# Patient Record
Sex: Female | Born: 1965 | Race: Black or African American | Hispanic: No | Smoking: Never smoker
Health system: Southern US, Community
[De-identification: ages and names within clinical notes are randomized; demographics above are authoritative.]

## PROBLEM LIST (undated history)

## (undated) DIAGNOSIS — D649 Anemia, unspecified: Secondary | ICD-10-CM

## (undated) DIAGNOSIS — F99 Mental disorder, not otherwise specified: Secondary | ICD-10-CM

## (undated) DIAGNOSIS — I10 Essential (primary) hypertension: Secondary | ICD-10-CM

## (undated) DIAGNOSIS — R000 Tachycardia, unspecified: Secondary | ICD-10-CM

## (undated) DIAGNOSIS — F419 Anxiety disorder, unspecified: Secondary | ICD-10-CM

## (undated) DIAGNOSIS — IMO0002 Reserved for concepts with insufficient information to code with codable children: Secondary | ICD-10-CM

## (undated) DIAGNOSIS — R87619 Unspecified abnormal cytological findings in specimens from cervix uteri: Secondary | ICD-10-CM

## (undated) DIAGNOSIS — B9689 Other specified bacterial agents as the cause of diseases classified elsewhere: Secondary | ICD-10-CM

## (undated) DIAGNOSIS — D219 Benign neoplasm of connective and other soft tissue, unspecified: Secondary | ICD-10-CM

## (undated) DIAGNOSIS — N760 Acute vaginitis: Principal | ICD-10-CM

## (undated) DIAGNOSIS — I499 Cardiac arrhythmia, unspecified: Secondary | ICD-10-CM

## (undated) HISTORY — DX: Reserved for concepts with insufficient information to code with codable children: IMO0002

## (undated) HISTORY — PX: DILATION AND CURETTAGE OF UTERUS: SHX78

## (undated) HISTORY — PX: TUBAL LIGATION: SHX77

## (undated) HISTORY — PX: ABDOMINAL HYSTERECTOMY: SHX81

## (undated) HISTORY — DX: Other specified bacterial agents as the cause of diseases classified elsewhere: B96.89

## (undated) HISTORY — DX: Acute vaginitis: N76.0

## (undated) HISTORY — DX: Mental disorder, not otherwise specified: F99

## (undated) HISTORY — DX: Unspecified abnormal cytological findings in specimens from cervix uteri: R87.619

---

## 2002-11-11 ENCOUNTER — Emergency Department (HOSPITAL_COMMUNITY): Admission: EM | Admit: 2002-11-11 | Discharge: 2002-11-11 | Payer: Self-pay

## 2003-01-22 ENCOUNTER — Encounter: Payer: Self-pay | Admitting: Emergency Medicine

## 2003-01-22 ENCOUNTER — Emergency Department (HOSPITAL_COMMUNITY): Admission: EM | Admit: 2003-01-22 | Discharge: 2003-01-22 | Payer: Self-pay | Admitting: *Deleted

## 2004-08-01 ENCOUNTER — Other Ambulatory Visit: Admission: RE | Admit: 2004-08-01 | Discharge: 2004-08-01 | Payer: Self-pay | Admitting: Obstetrics and Gynecology

## 2006-03-08 ENCOUNTER — Emergency Department (HOSPITAL_COMMUNITY): Admission: EM | Admit: 2006-03-08 | Discharge: 2006-03-08 | Payer: Self-pay | Admitting: Emergency Medicine

## 2007-09-07 ENCOUNTER — Emergency Department (HOSPITAL_COMMUNITY): Admission: EM | Admit: 2007-09-07 | Discharge: 2007-09-07 | Payer: Self-pay | Admitting: Emergency Medicine

## 2008-10-30 ENCOUNTER — Emergency Department (HOSPITAL_COMMUNITY): Admission: EM | Admit: 2008-10-30 | Discharge: 2008-10-30 | Payer: Self-pay | Admitting: Emergency Medicine

## 2009-09-07 ENCOUNTER — Emergency Department (HOSPITAL_COMMUNITY): Admission: EM | Admit: 2009-09-07 | Discharge: 2009-09-07 | Payer: Self-pay | Admitting: Emergency Medicine

## 2010-03-31 ENCOUNTER — Emergency Department (HOSPITAL_COMMUNITY): Admission: EM | Admit: 2010-03-31 | Discharge: 2010-03-31 | Payer: Self-pay | Admitting: Emergency Medicine

## 2010-08-18 ENCOUNTER — Emergency Department (HOSPITAL_COMMUNITY): Admission: EM | Admit: 2010-08-18 | Discharge: 2010-08-18 | Payer: Self-pay | Admitting: Emergency Medicine

## 2010-09-13 ENCOUNTER — Emergency Department (HOSPITAL_COMMUNITY): Admission: EM | Admit: 2010-09-13 | Discharge: 2010-09-13 | Payer: Self-pay | Admitting: Emergency Medicine

## 2011-03-15 LAB — URINALYSIS, ROUTINE W REFLEX MICROSCOPIC
Bilirubin Urine: NEGATIVE
Bilirubin Urine: NEGATIVE
Glucose, UA: NEGATIVE mg/dL
Glucose, UA: NEGATIVE mg/dL
Hgb urine dipstick: NEGATIVE
Ketones, ur: NEGATIVE mg/dL
Ketones, ur: NEGATIVE mg/dL
Leukocytes, UA: NEGATIVE
Nitrite: NEGATIVE
Nitrite: NEGATIVE
Protein, ur: NEGATIVE mg/dL
Protein, ur: NEGATIVE mg/dL
Specific Gravity, Urine: 1.005 (ref 1.005–1.030)
Specific Gravity, Urine: <1.005 — ABNORMAL LOW (ref 1.005–1.030)
Urobilinogen, UA: 0.2 mg/dL (ref 0.0–1.0)
Urobilinogen, UA: 0.2 mg/dL (ref 0.0–1.0)
pH: 5.5 (ref 5.0–8.0)
pH: 6 (ref 5.0–8.0)

## 2011-03-15 LAB — BASIC METABOLIC PANEL
BUN: 4 mg/dL — ABNORMAL LOW (ref 6–23)
CO2: 27 mEq/L (ref 19–32)
Calcium: 9.1 mg/dL (ref 8.4–10.5)
Chloride: 105 mEq/L (ref 96–112)
Creatinine, Ser: 0.79 mg/dL (ref 0.4–1.2)
GFR calc Af Amer: >60 mL/min (ref >60)
GFR calc non Af Amer: >60 mL/min (ref >60)
Glucose, Bld: 77 mg/dL (ref 70–99)
Potassium: 3.5 mEq/L (ref 3.5–5.1)
Sodium: 138 mEq/L (ref 135–145)

## 2011-03-15 LAB — CBC
HCT: 28.3 % — ABNORMAL LOW (ref 36.0–46.0)
Hemoglobin: 8.7 g/dL — ABNORMAL LOW (ref 12.0–15.0)
MCHC: 30.7 g/dL (ref 30.0–36.0)
MCV: 59.3 fL — ABNORMAL LOW (ref 78.0–100.0)
Platelets: 422 10*3/uL — ABNORMAL HIGH (ref 150–400)
RBC: 4.78 MIL/uL (ref 3.87–5.11)
RDW: 20.5 % — ABNORMAL HIGH (ref 11.5–15.5)
WBC: 3.6 10*3/uL — ABNORMAL LOW (ref 4.0–10.5)

## 2011-03-15 LAB — DIFFERENTIAL
Basophils Absolute: 0 10*3/uL (ref 0.0–0.1)
Basophils Relative: 0 % (ref 0–1)
Eosinophils Absolute: 0.1 10*3/uL (ref 0.0–0.7)
Eosinophils Relative: 2 % (ref 0–5)
Lymphocytes Relative: 30 % (ref 12–46)
Lymphs Abs: 1.1 10*3/uL (ref 0.7–4.0)
Monocytes Absolute: 0.3 10*3/uL (ref 0.1–1.0)
Monocytes Relative: 9 % (ref 3–12)
Neutro Abs: 2.1 10*3/uL (ref 1.7–7.7)
Neutrophils Relative %: 60 % (ref 43–77)

## 2011-03-15 LAB — PREGNANCY, URINE: Preg Test, Ur: NEGATIVE

## 2011-03-15 LAB — URINE MICROSCOPIC-ADD ON

## 2011-03-15 LAB — GLUCOSE, CAPILLARY: Glucose-Capillary: 80 mg/dL (ref 70–99)

## 2011-03-31 LAB — WET PREP, GENITAL
Trich, Wet Prep: NONE SEEN
Yeast Wet Prep HPF POC: NONE SEEN

## 2011-03-31 LAB — URINE MICROSCOPIC-ADD ON

## 2011-03-31 LAB — URINALYSIS, ROUTINE W REFLEX MICROSCOPIC
Bilirubin Urine: NEGATIVE
Glucose, UA: NEGATIVE mg/dL
Hgb urine dipstick: NEGATIVE
Ketones, ur: NEGATIVE mg/dL
Leukocytes, UA: NEGATIVE
Nitrite: POSITIVE — AB
Protein, ur: NEGATIVE mg/dL
Specific Gravity, Urine: 1.015 (ref 1.005–1.030)
Urobilinogen, UA: 0.2 mg/dL (ref 0.0–1.0)
pH: 5.5 (ref 5.0–8.0)

## 2011-03-31 LAB — GC/CHLAMYDIA PROBE AMP, GENITAL
Chlamydia, DNA Probe: NEGATIVE
GC Probe Amp, Genital: NEGATIVE

## 2011-03-31 LAB — URINE CULTURE: Colony Count: >=100000

## 2011-05-09 NOTE — Consult Note (Signed)
Vicki [name], Vicki [name]               ACCOUNT NO.:  [id number]   MEDICAL RECORD NO.:  [id number]          PATIENT TYPE:  INP   LOCATION:  A339                          FACILITY:  [facility]   PHYSICIAN:  Tilda Burrow, M.D. DATE OF BIRTH:  02/01/1966   DATE OF CONSULTATION:  DATE OF DISCHARGE:  09/07/2007                                 CONSULTATION   CHIEF COMPLAINT:  Heavy vaginal bleeding, iron deficiency anemia.   HISTORY OF PRESENT ILLNESS:  This 45 year old female patient of [facility] is seen in the emergency room due to cramping, heavy flow,  on day #2 of her menses.  She is seen in the emergency room where white  count is normal at 5900, hemoglobin severely anemic at 7.0, hematocrit  23.9%, with hypochromic microcytic indices with MCV 56.4.  Urinalysis  negative.  BUN 5, creatinine 0.78.  She describes herself as having only  3-day periods but really severe heavy flow when she has them.  She  presents here for evaluation due to the heavy bleeding.  Her platelets  are 344,000.  She has not been seen in our office for a year, but she  reports being told in the past that she had fibroids.   PHYSICAL EXAMINATION:  GENERAL:  Very slim-figured African American  female, alert and oriented x3, able to sit up in bed with mild-to-  moderate discomfort related to her menstrual cramping only.  She is  tolerating regular food.  IV is in place.  SKIN:  Skin turgor is good.  ABDOMEN:  Nontender, except for small suprapubic tenderness.  Urinalysis  negative.  PELVIC:  Normal external genitalia.  Speculum exam shows heavy pooling  of probably 30 mL of blood in the vaginal vault, with cervix grossly  normal to appearance.  The uterus is anteflexed, upper limits of normal  size at most.  Easily examined adnexa without masses.  There are no  active clots at the present time.   ASSESSMENT:  Menorrhagia and severe anemia.   PLAN:  1. Prescription for Repliva, 1 tablet daily x30 days.  2. Ovcon-50 1 tablets 3 times a day for 7 days to control menses.  3. Vicodin 5/500, 30 tablets, 1-2 q.4h. p.r.n. pain.   FOLLOW UP:  In 1 week at [facility] for evaluation.  Endometrial  ablation is discussed with the patient as a future possible solution if  ultrasound confirms grossly normal endometrial cavity.      Tilda Burrow, M.D.  Electronically Signed    JVF/MEDQ  D:  09/07/2007  T:  09/08/2007  Job:  [id number]

## 2011-10-06 LAB — CROSSMATCH
ABO/RH(D): O POS
Antibody Screen: NEGATIVE

## 2011-10-06 LAB — URINALYSIS, ROUTINE W REFLEX MICROSCOPIC
Bilirubin Urine: NEGATIVE
Glucose, UA: NEGATIVE
Hgb urine dipstick: NEGATIVE
Ketones, ur: NEGATIVE
Nitrite: NEGATIVE
Protein, ur: NEGATIVE
Specific Gravity, Urine: 1.015
Urobilinogen, UA: 0.2
pH: 7

## 2011-10-06 LAB — DIFFERENTIAL
Band Neutrophils: 0
Basophils Absolute: 0
Basophils Relative: 0
Blasts: 0
Eosinophils Absolute: 0
Eosinophils Relative: 0
Lymphocytes Relative: 32
Lymphs Abs: 1.9
Metamyelocytes Relative: 0
Monocytes Absolute: 0.3
Monocytes Relative: 5
Myelocytes: 0
Neutro Abs: 3.7
Neutrophils Relative %: 63
Promyelocytes Absolute: 0
nRBC: 0

## 2011-10-06 LAB — BASIC METABOLIC PANEL
BUN: 5 — ABNORMAL LOW
CO2: 27
Calcium: 9.1
Chloride: 110
Creatinine, Ser: 0.78
GFR calc Af Amer: >60
GFR calc non Af Amer: >60
Glucose, Bld: 96
Potassium: 3.6
Sodium: 139

## 2011-10-06 LAB — URINE MICROSCOPIC-ADD ON

## 2011-10-06 LAB — CBC
HCT: 23.9 — ABNORMAL LOW
Hemoglobin: 7 — CL
MCHC: 29.3 — ABNORMAL LOW
MCV: 56.4 — ABNORMAL LOW
Platelets: 344
RBC: 4.24
RDW: 19.7 — ABNORMAL HIGH
WBC: 5.9

## 2011-10-06 LAB — SAMPLE TO BLOOD BANK

## 2011-10-06 LAB — ABO/RH: ABO/RH(D): O POS

## 2012-04-02 ENCOUNTER — Encounter (HOSPITAL_COMMUNITY): Payer: Self-pay | Admitting: *Deleted

## 2012-04-02 ENCOUNTER — Inpatient Hospital Stay (HOSPITAL_COMMUNITY)
Admission: EM | Admit: 2012-04-02 | Discharge: 2012-04-03 | DRG: 690 | Disposition: A | Discharge status: Home / Self Care | Payer: Self-pay | Attending: Internal Medicine | Admitting: Internal Medicine

## 2012-04-02 DIAGNOSIS — D649 Anemia, unspecified: Secondary | ICD-10-CM | POA: Diagnosis present

## 2012-04-02 DIAGNOSIS — E876 Hypokalemia: Secondary | ICD-10-CM | POA: Diagnosis present

## 2012-04-02 DIAGNOSIS — N920 Excessive and frequent menstruation with regular cycle: Secondary | ICD-10-CM | POA: Diagnosis present

## 2012-04-02 DIAGNOSIS — N390 Urinary tract infection, site not specified: Principal | ICD-10-CM | POA: Diagnosis present

## 2012-04-02 DIAGNOSIS — R112 Nausea with vomiting, unspecified: Secondary | ICD-10-CM | POA: Diagnosis present

## 2012-04-02 DIAGNOSIS — D500 Iron deficiency anemia secondary to blood loss (chronic): Secondary | ICD-10-CM | POA: Diagnosis present

## 2012-04-02 DIAGNOSIS — D259 Leiomyoma of uterus, unspecified: Secondary | ICD-10-CM | POA: Diagnosis present

## 2012-04-02 DIAGNOSIS — R1110 Vomiting, unspecified: Secondary | ICD-10-CM | POA: Diagnosis present

## 2012-04-02 HISTORY — DX: Anemia, unspecified: D64.9

## 2012-04-02 HISTORY — DX: Benign neoplasm of connective and other soft tissue, unspecified: D21.9

## 2012-04-02 LAB — URINALYSIS, ROUTINE W REFLEX MICROSCOPIC
Bilirubin Urine: NEGATIVE
Glucose, UA: NEGATIVE mg/dL
Hgb urine dipstick: NEGATIVE
Ketones, ur: NEGATIVE mg/dL
Nitrite: POSITIVE — AB
Protein, ur: NEGATIVE mg/dL
Specific Gravity, Urine: 1.007 (ref 1.005–1.030)
Urobilinogen, UA: 1 mg/dL (ref 0.0–1.0)
pH: 7.5 (ref 5.0–8.0)

## 2012-04-02 LAB — HEPATIC FUNCTION PANEL
ALT: 7 U/L (ref 0–35)
AST: 26 U/L (ref 0–37)
Albumin: 4 g/dL (ref 3.5–5.2)
Alkaline Phosphatase: 66 U/L (ref 39–117)
Bilirubin, Direct: <0.1 mg/dL (ref 0.0–0.3)
Total Bilirubin: 0.2 mg/dL — ABNORMAL LOW (ref 0.3–1.2)
Total Protein: 7.5 g/dL (ref 6.0–8.3)

## 2012-04-02 LAB — BASIC METABOLIC PANEL
BUN: 8 mg/dL (ref 6–23)
CO2: 24 mEq/L (ref 19–32)
Calcium: 8.8 mg/dL (ref 8.4–10.5)
Chloride: 103 mEq/L (ref 96–112)
Creatinine, Ser: 0.77 mg/dL (ref 0.50–1.10)
GFR calc Af Amer: >90 mL/min (ref >90)
GFR calc non Af Amer: >90 mL/min (ref >90)
Glucose, Bld: 82 mg/dL (ref 70–99)
Potassium: 3.4 mEq/L — ABNORMAL LOW (ref 3.5–5.1)
Sodium: 137 mEq/L (ref 135–145)

## 2012-04-02 LAB — DIFFERENTIAL
Basophils Absolute: 0.1 10*3/uL (ref 0.0–0.1)
Basophils Relative: 1 % (ref 0–1)
Eosinophils Absolute: 0.1 10*3/uL (ref 0.0–0.7)
Eosinophils Relative: 2 % (ref 0–5)
Lymphocytes Relative: 41 % (ref 12–46)
Lymphs Abs: 2.4 10*3/uL (ref 0.7–4.0)
Monocytes Absolute: 0.4 10*3/uL (ref 0.1–1.0)
Monocytes Relative: 6 % (ref 3–12)
Neutro Abs: 2.9 10*3/uL (ref 1.7–7.7)
Neutrophils Relative %: 50 % (ref 43–77)

## 2012-04-02 LAB — CBC
HCT: 24.4 % — ABNORMAL LOW (ref 36.0–46.0)
Hemoglobin: 6.6 g/dL — CL (ref 12.0–15.0)
MCH: 15.2 pg — ABNORMAL LOW (ref 26.0–34.0)
MCHC: 27 g/dL — ABNORMAL LOW (ref 30.0–36.0)
MCV: 56.1 fL — ABNORMAL LOW (ref 78.0–100.0)
Platelets: 398 10*3/uL (ref 150–400)
RBC: 4.35 MIL/uL (ref 3.87–5.11)
RDW: 19.2 % — ABNORMAL HIGH (ref 11.5–15.5)
WBC: 5.9 10*3/uL (ref 4.0–10.5)

## 2012-04-02 LAB — URINE MICROSCOPIC-ADD ON

## 2012-04-02 LAB — PREPARE RBC (CROSSMATCH)

## 2012-04-02 LAB — PROTIME-INR
INR: 1.01 (ref 0.00–1.49)
Prothrombin Time: 13.5 seconds (ref 11.6–15.2)

## 2012-04-02 LAB — APTT: aPTT: 31 seconds (ref 24–37)

## 2012-04-02 LAB — OCCULT BLOOD, POC DEVICE: Fecal Occult Bld: NEGATIVE

## 2012-04-02 MED ORDER — DEXTROSE 5 % IV SOLN
1.0000 g | Freq: Once | INTRAVENOUS | Status: AC
  Administered 2012-04-02: 1 g via INTRAVENOUS
  Filled 2012-04-02: qty 10

## 2012-04-02 MED ORDER — SODIUM CHLORIDE 0.9 % IV BOLUS (SEPSIS)
1000.0000 mL | Freq: Once | INTRAVENOUS | Status: AC
  Administered 2012-04-02: 1000 mL via INTRAVENOUS

## 2012-04-02 NOTE — ED Notes (Signed)
Patient states she does not feel good.  She has nausea since Saturday.  She states has dry heaves.

## 2012-04-02 NOTE — ED Provider Notes (Signed)
History     CSN: [id number]  Arrival date & time 04/02/12  1554   First MD Initiated Contact with Patient 04/02/12 2149      Chief Complaint  Patient presents with  . Emesis  . Weakness    (Consider location/radiation/quality/duration/timing/severity/associated sxs/prior treatment) Patient is a 46 y.o. female presenting with abdominal pain. The history is provided by the patient.  Abdominal Pain The primary symptoms of the illness include abdominal pain, fatigue and dysuria. The primary symptoms of the illness do not include fever, shortness of breath, nausea, vomiting, vaginal discharge or vaginal bleeding. Episode onset: 4 days ago. The onset of the illness was sudden. The problem has not changed since onset. The abdominal pain is located in the suprapubic region. The abdominal pain does not radiate.  The dysuria is associated with frequency. The dysuria is not associated with urgency or vaginal pain.   Additional symptoms associated with the illness include frequency. Symptoms associated with the illness do not include chills, constipation or urgency.    History reviewed. No pertinent past medical history.  Past Surgical History  Procedure Date  . Tubal ligation     No family history on file.  History  Substance Use Topics  . Smoking status: Never Smoker   . Smokeless tobacco: Not on file  . Alcohol Use: Yes    OB History    Grav Para Term Preterm Abortions TAB SAB Ect Mult Living                  Review of Systems  Constitutional: Positive for fatigue. Negative for fever and chills.  HENT: Negative for congestion and rhinorrhea.   Respiratory: Negative for shortness of breath.   Cardiovascular: Negative for chest pain and leg swelling.  Gastrointestinal: Positive for abdominal pain. Negative for nausea, vomiting and constipation.  Genitourinary: Positive for dysuria and frequency. Negative for urgency, decreased urine volume, vaginal bleeding, vaginal  discharge, difficulty urinating and vaginal pain.  Skin: Negative for wound.  Neurological: Positive for weakness (generalized) and light-headedness. Negative for headaches.  Psychiatric/Behavioral: Negative for confusion.  All other systems reviewed and are negative.    Allergies  Review of patient's allergies indicates no known allergies.  Home Medications   Current Outpatient Rx  Name Route Sig Dispense Refill  . ALKA-SELTZER PLUS COLD PO Oral Take 2 Wafers by mouth 2 (two) times daily as needed. For cold symptoms      BP 115/71  Pulse 65  Temp(Src) 98.5 F (36.9 C) (Oral)  Resp 12  Ht 5\' 7"  (1.702 m)  Wt 125 lb (56.7 kg)  BMI 19.58 kg/m2  SpO2 100%  Physical Exam  Nursing note and vitals reviewed. Constitutional: She is oriented to person, place, and time. She appears well-developed and well-nourished. No distress.  HENT:  Head: Normocephalic and atraumatic.  Right Ear: External ear normal.  Left Ear: External ear normal.  Nose: Nose normal.  Mouth/Throat: Oropharynx is clear and moist.  Neck: Neck supple.  Cardiovascular: Normal rate, regular rhythm, normal heart sounds and intact distal pulses.   Pulmonary/Chest: Effort normal and breath sounds normal. No respiratory distress. She has no wheezes. She has no rales.  Abdominal: Soft. She exhibits no distension. There is tenderness in the suprapubic area.  Genitourinary: Rectum normal. Guaiac negative stool.  Musculoskeletal: She exhibits no edema.  Lymphadenopathy:    She has no cervical adenopathy.  Neurological: She is alert and oriented to person, place, and time.  Skin: Skin is warm and  dry. She is not diaphoretic. No pallor.    ED Course  Procedures (including critical care time)  Labs Reviewed  BASIC METABOLIC PANEL - Abnormal; Notable for the following:    Potassium 3.4 (*)    All other components within normal limits  URINALYSIS, ROUTINE W REFLEX MICROSCOPIC - Abnormal; Notable for the following:      Nitrite POSITIVE (*)    Leukocytes, UA MODERATE (*)    All other components within normal limits  CBC - Abnormal; Notable for the following:    Hemoglobin 6.6 (*)    HCT 24.4 (*)    MCV 56.1 (*)    MCH 15.2 (*)    MCHC 27.0 (*)    RDW 19.2 (*)    All other components within normal limits  POCT PREGNANCY, URINE - Abnormal; Notable for the following:    All other components within normal limits  URINE MICROSCOPIC-ADD ON - Abnormal; Notable for the following:    Bacteria, UA MANY (*)    All other components within normal limits  DIFFERENTIAL  PREPARE RBC (CROSSMATCH)  TYPE AND SCREEN  APTT  PROTIME-INR  OCCULT BLOOD, POC DEVICE  HEPATIC FUNCTION PANEL  URINE CULTURE    1. Anemia   2. UTI (urinary tract infection)       MDM  46 yo female with weakness, nausea and lower abd pain for a few days. No diarrhea, vomiting or constipation. +urinary symptoms, as well as suprapubic tenderness. No vaginal symptoms currently, but on questioning after seeing Hgb, states she's had heavy periods due to fibroids for years, however has not had a hysterectomy yet. No active bleeding now. No blood in stools, and rectal exam negative. Due to her being symptomatic with anemia, will transfuse and admit to hospitalist. Urine is positive for UTI (has been symptomatic), will culture and give rocephin. Pregnancy test positive in computer, but when speaking with the lab tech who did the test he says it was negative but that he clicked the wrong button.        Pricilla Loveless, MD 04/02/12 2325

## 2012-04-02 NOTE — ED Notes (Signed)
Mini-lab note by Michel Santee.  Accidentally entered a positive result on pregnancy test on glucometer.  The actual result was negative and is indicated on paper documentation.  A POC edit sheet has been completed to correct result in chart and faxed to POC office.

## 2012-04-02 NOTE — ED Notes (Signed)
Pt stayed at 99-100% while ambulating in the hall.

## 2012-04-03 ENCOUNTER — Encounter (HOSPITAL_COMMUNITY): Payer: Self-pay | Admitting: Internal Medicine

## 2012-04-03 ENCOUNTER — Other Ambulatory Visit: Payer: Self-pay

## 2012-04-03 DIAGNOSIS — E876 Hypokalemia: Secondary | ICD-10-CM | POA: Diagnosis present

## 2012-04-03 DIAGNOSIS — R1110 Vomiting, unspecified: Secondary | ICD-10-CM | POA: Diagnosis present

## 2012-04-03 DIAGNOSIS — N920 Excessive and frequent menstruation with regular cycle: Secondary | ICD-10-CM | POA: Diagnosis present

## 2012-04-03 DIAGNOSIS — D649 Anemia, unspecified: Secondary | ICD-10-CM | POA: Diagnosis present

## 2012-04-03 DIAGNOSIS — N390 Urinary tract infection, site not specified: Secondary | ICD-10-CM | POA: Diagnosis present

## 2012-04-03 LAB — RETICULOCYTES
RBC.: 4.2 MIL/uL (ref 3.87–5.11)
Retic Count, Absolute: 33.6 10*3/uL (ref 19.0–186.0)
Retic Ct Pct: 0.8 % (ref 0.4–3.1)

## 2012-04-03 LAB — CARDIAC PANEL(CRET KIN+CKTOT+MB+TROPI)
CK, MB: 1 ng/mL (ref 0.3–4.0)
Relative Index: INVALID (ref 0.0–2.5)
Total CK: 38 U/L (ref 7–177)
Troponin I: <0.3 ng/mL (ref <0.30)

## 2012-04-03 LAB — BASIC METABOLIC PANEL
BUN: 6 mg/dL (ref 6–23)
CO2: 21 mEq/L (ref 19–32)
Calcium: 9 mg/dL (ref 8.4–10.5)
Chloride: 109 mEq/L (ref 96–112)
Creatinine, Ser: 0.81 mg/dL (ref 0.50–1.10)
GFR calc Af Amer: >90 mL/min (ref >90)
GFR calc non Af Amer: 85 mL/min — ABNORMAL LOW (ref >90)
Glucose, Bld: 94 mg/dL (ref 70–99)
Potassium: 3.6 mEq/L (ref 3.5–5.1)
Sodium: 141 mEq/L (ref 135–145)

## 2012-04-03 LAB — FERRITIN: Ferritin: 2 ng/mL — ABNORMAL LOW (ref 10–291)

## 2012-04-03 LAB — CBC
HCT: 32.8 % — ABNORMAL LOW (ref 36.0–46.0)
Hemoglobin: 9.8 g/dL — ABNORMAL LOW (ref 12.0–15.0)
MCH: 19.1 pg — ABNORMAL LOW (ref 26.0–34.0)
MCHC: 29.9 g/dL — ABNORMAL LOW (ref 30.0–36.0)
MCV: 63.9 fL — ABNORMAL LOW (ref 78.0–100.0)
Platelets: 316 10*3/uL (ref 150–400)
RBC: 5.13 MIL/uL — ABNORMAL HIGH (ref 3.87–5.11)
RDW: 27.6 % — ABNORMAL HIGH (ref 11.5–15.5)
WBC: 6 10*3/uL (ref 4.0–10.5)

## 2012-04-03 LAB — VITAMIN B12: Vitamin B-12: 347 pg/mL (ref 211–911)

## 2012-04-03 LAB — ABO/RH: ABO/RH(D): O POS

## 2012-04-03 LAB — IRON AND TIBC
Iron: <10 ug/dL — ABNORMAL LOW (ref 42–135)
UIBC: 393 ug/dL (ref 125–400)

## 2012-04-03 LAB — PREPARE RBC (CROSSMATCH)

## 2012-04-03 LAB — FOLATE: Folate: 8.4 ng/mL

## 2012-04-03 LAB — POCT PREGNANCY, URINE: Preg Test, Ur: NEGATIVE

## 2012-04-03 MED ORDER — CIPROFLOXACIN HCL 500 MG PO TABS: 500.0000 mg | ORAL_TABLET | Freq: Two times a day (BID) | ORAL | Status: AC

## 2012-04-03 MED ORDER — FERROUS SULFATE 325 (65 FE) MG PO TABS: 325.0000 mg | ORAL_TABLET | Freq: Three times a day (TID) | ORAL | Status: DC

## 2012-04-03 MED ORDER — PANTOPRAZOLE SODIUM 40 MG PO TBEC: 40.0000 mg | DELAYED_RELEASE_TABLET | Freq: Every day | ORAL | Status: DC

## 2012-04-03 MED ORDER — DEXTROSE 5 % IV SOLN
1.0000 g | Freq: Every day | INTRAVENOUS | Status: DC
  Filled 2012-04-03 (×2): qty 10

## 2012-04-03 MED ORDER — DEXTROSE 5 % IV SOLN: 1.0000 g | INTRAVENOUS | Status: DC

## 2012-04-03 MED ORDER — SODIUM CHLORIDE 0.9 % IJ SOLN
3.0000 mL | Freq: Two times a day (BID) | INTRAMUSCULAR | Status: DC
  Administered 2012-04-03: 3 mL via INTRAVENOUS

## 2012-04-03 MED ORDER — SODIUM CHLORIDE 0.45 % IV SOLN
INTRAVENOUS | Status: DC
  Administered 2012-04-03: 09:00:00 via INTRAVENOUS

## 2012-04-03 MED ORDER — ACETAMINOPHEN 650 MG RE SUPP: 650.0000 mg | Freq: Four times a day (QID) | RECTAL | Status: DC | PRN

## 2012-04-03 MED ORDER — ACETAMINOPHEN 325 MG PO TABS: 650.0000 mg | ORAL_TABLET | Freq: Four times a day (QID) | ORAL | Status: DC | PRN

## 2012-04-03 NOTE — Progress Notes (Signed)
Patient was under my care since 4-9 until 4-10. She has been sick since 4-08.  Please excuse patient from work prior dates.   Hartley Barefoot MD. [facility].

## 2012-04-03 NOTE — Discharge Summary (Signed)
Admit date: 04/02/2012 Discharge date: 04/03/2012  Primary Care Physician:  Kirk Ruths, MD, MD   Discharge Diagnoses:     Date Noted   . Anemia, iron deficiency.  04/03/2012   . Menorrhagia 04/03/2012   . Hypokalemia, resolved. 04/03/2012   . UTI (lower urinary tract infection) 04/03/2012   . Emesis, resolved.  04/03/2012              DISCHARGE MEDICATION: Medication List  As of 04/03/2012 11:11 AM   TAKE these medications         ALKA-SELTZER PLUS COLD PO   Take 2 Wafers by mouth 2 (two) times daily as needed. For cold symptoms      ciprofloxacin 500 MG tablet   Commonly known as: CIPRO   Take 1 tablet (500 mg total) by mouth 2 (two) times daily.      ferrous sulfate 325 (65 FE) MG tablet   Take 1 tablet (325 mg total) by mouth 3 (three) times daily with meals.            HPI:   AA 46 year old female with no significant past medical history except for known uterine fibroid and heavy periods. Patient is being followed by Dr.Ferguson over at [facility]. She is apparently scheduled to have some surgery in the past which never happened. She is currently on high periods which usually lasts about 3 days a month. Whenever she has a period, she has heavy bleeding she uses up to 2 packs of pads which is a total of 36 individual packs in 3 days. She started experiencing weakness recently. Patient is worried about what may have happened because she had it in the past. She had blood transfusion but nothing recent. She decided to come in for further workup. She denied abdominal pain no vaginal discharge but she did have some emesis. She denied any fever. She denied any other bleeding from rectum all other sites. In the emergency room her hemoglobin was found to be only 6.6. No hemoglobin is currently unavailable to compare but patient the last time she had it checked it was within normal limits after blood transfusion. She has not followed up with her gynecologist in a long  time  Hospital Course:  1-Iron deficiency anemia, symptomatic. Likely secondary to menorrhagia. Patient Hb increase to 9 from 6. She received 2 units of PRBC. No evidence of hemolysis, guaiac stool negative. She is feeling better. Denies dizziness or weakness. Anemia panel pending , result will need to be follow up by PCP. I will discharge her on ferrous sulfate. She will need to follow up with her GYN. Patient denies vaginal bleeding at this time.   2- UTI: Received 1 dose ceftriaxone. Will discharge on ciprofloxacin. Urine culture will need to be follow up by PCP.   3-Vomiting : resolved. Tolerating diet.     Disposition and Follow-up: with PCP for hb and with GYN.  Discharge Orders    Future Orders Please Complete By Expires   Diet general      Increase activity slowly        Follow-up Information    Follow up with Kirk Ruths, MD in 3 days. (Please follow up with Gynecologist.)           DISCHARGE EXAM:  General: no distress. CVS: S1 S2 RRR. Lungs: CTA. Abdomen: BS presents, soft NT, ND. Extremities : no edema.   Blood pressure 115/63, pulse 62, temperature 98.8 F (37.1 C), temperature source Oral, resp. rate  16, height 5\' 7"  (1.702 m), weight 52.164 kg (115 lb), last menstrual period 03/20/2012, SpO2 100.00%.   Basename 04/03/12 0830 04/02/12 2156  NA 141 137  K 3.6 3.4*  CL 109 103  CO2 21 24  GLUCOSE 94 82  BUN 6 8  CREATININE 0.81 0.77  CALCIUM 9.0 8.8  MG -- --  PHOS -- --    Basename 04/02/12 2248  AST 26  ALT 7  ALKPHOS 66  BILITOT 0.2*  PROT 7.5  ALBUMIN 4.0   Basename 04/03/12 0830 04/02/12 2156  WBC 6.0 5.9  NEUTROABS -- 2.9  HGB 9.8* 6.6*  HCT 32.8* 24.4*  MCV 63.9* 56.1*  PLT 316 398    Signed: Jaidin Richison M.D. 04/03/2012, 11:11 AM

## 2012-04-03 NOTE — ED Notes (Signed)
Blood was started AFTER anemia panel was drawn!

## 2012-04-03 NOTE — Progress Notes (Signed)
Pt received discharge instructions and new prescriptions. Phone number for OB-GYN given to patient so she can call for appointment today. Stable to be DC'd. No ride, but patient feels she can drive. Discuss with Dr. Sunnie Nielsen and she said this is okay since she is only driving a short distance. No further questions. Duwaine Maxin, RN

## 2012-04-03 NOTE — ED Provider Notes (Signed)
  I performed a history and physical examination of Vicki [name] and discussed her management with Dr. Criss Alvine.  I agree with the history, physical, assessment, and plan of care, with the following exceptions: None  Symptomatic anemia. She's had increasing abdominal pain, generalized weakness over the past 4 days. She also has urinary symptoms. Mild suprapubic abdominal pain. Was found have a urinary tract infection and hemoglobin of 6.6. She'll require transfusion and admission to the hospital for symptomatic anemia and urinary tract infection. Just received Rocephin.  I was present for the following procedures: None Time Spent in Critical Care of the patient: None Time spent in discussions with the patient and family: 10 min  Tildon Husky, MD 04/03/12 [phone]

## 2012-04-03 NOTE — H&P (Signed)
Vicki [name] is an 46 y.o. female.   Chief Complaint: Weakness HPI: A 46 year old female with no significant past medical history except for known uterine fibroid and heavy periods. Patient is being followed by Dr.Ferguson over at [facility] She is apparently scheduled to have some surgery in the past which never happened. She is currently on high periods which usually lasts about 3 days a month. Whenever she has a period, she has heavy bleeding she uses up to 2 packs of pads which is a total of 36 individual packs in 3 days. She started experiencing weakness recently. Patient is worried about what may have happened because she had it in the past. She had blood transfusion but nothing recent. She decided to come in for further workup. She denied abdominal pain no vaginal discharge but she did have some emesis. She denied any fever. She denied any other bleeding from rectum all other sites. In the emergency room her hemoglobin was found to be only 6.6. No hemoglobin is currently unavailable to compare but patient the last time she had it checked it was within normal limits after blood transfusion. She has not followed up with her gynecologist in a long time  Past Medical History  Diagnosis Date  . Anemia     Past Surgical History  Procedure Date  . Tubal ligation     History reviewed. No pertinent family history. Social History:  reports that she has never smoked. She does not have any smokeless tobacco history on file. She reports that she drinks alcohol. She reports that she does not use illicit drugs.  Allergies: No Known Allergies  Medications Prior to Admission  Medication Dose Route Frequency Provider Last Rate Last Dose  . cefTRIAXone (ROCEPHIN) 1 g in dextrose 5 % 50 mL IVPB  1 g Intravenous Once Pricilla Loveless, MD   1 g at 04/02/12 2329  . sodium chloride 0.9 % bolus 1,000 mL  1,000 mL Intravenous Once Pricilla Loveless, MD   1,000 mL at 04/02/12 2228   No current outpatient  prescriptions on file as of 04/02/2012.    Results for orders placed during the hospital encounter of 04/02/12 (from the past 48 hour(s))  BASIC METABOLIC PANEL     Status: Abnormal   Collection Time   04/02/12  9:56 PM      Component Value Range Comment   Sodium 137  135 - 145 (mEq/L)    Potassium 3.4 (*) 3.5 - 5.1 (mEq/L)    Chloride 103  96 - 112 (mEq/L)    CO2 24  19 - 32 (mEq/L)    Glucose, Bld 82  70 - 99 (mg/dL)    BUN 8  6 - 23 (mg/dL)    Creatinine, Ser 1.61  0.50 - 1.10 (mg/dL)    Calcium 8.8  8.4 - 10.5 (mg/dL)    GFR calc non Af Amer >90  >90 (mL/min)    GFR calc Af Amer >90  >90 (mL/min)   CBC     Status: Abnormal   Collection Time   04/02/12  9:56 PM      Component Value Range Comment   WBC 5.9  4.0 - 10.5 (K/uL)    RBC 4.35  3.87 - 5.11 (MIL/uL)    Hemoglobin 6.6 (*) 12.0 - 15.0 (g/dL)    HCT 09.6 (*) 04.5 - 46.0 (%)    MCV 56.1 (*) 78.0 - 100.0 (fL)    MCH 15.2 (*) 26.0 - 34.0 (pg)  MCHC 27.0 (*) 30.0 - 36.0 (g/dL)    RDW 57.8 (*) 46.9 - 15.5 (%)    Platelets 398  150 - 400 (K/uL)   DIFFERENTIAL     Status: Normal   Collection Time   04/02/12  9:56 PM      Component Value Range Comment   Neutrophils Relative 50  43 - 77 (%)    Lymphocytes Relative 41  12 - 46 (%)    Monocytes Relative 6  3 - 12 (%)    Eosinophils Relative 2  0 - 5 (%)    Basophils Relative 1  0 - 1 (%)    Neutro Abs 2.9  1.7 - 7.7 (K/uL)    Lymphs Abs 2.4  0.7 - 4.0 (K/uL)    Monocytes Absolute 0.4  0.1 - 1.0 (K/uL)    Eosinophils Absolute 0.1  0.0 - 0.7 (K/uL)    Basophils Absolute 0.1  0.0 - 0.1 (K/uL)    RBC Morphology POLYCHROMASIA PRESENT      Smear Review LARGE PLATELETS PRESENT     URINALYSIS, ROUTINE W REFLEX MICROSCOPIC     Status: Abnormal   Collection Time   04/02/12 10:34 PM      Component Value Range Comment   Color, Urine YELLOW  YELLOW     APPearance CLEAR  CLEAR     Specific Gravity, Urine 1.007  1.005 - 1.030     pH 7.5  5.0 - 8.0     Glucose, UA NEGATIVE  NEGATIVE  (mg/dL)    Hgb urine dipstick NEGATIVE  NEGATIVE     Bilirubin Urine NEGATIVE  NEGATIVE     Ketones, ur NEGATIVE  NEGATIVE (mg/dL)    Protein, ur NEGATIVE  NEGATIVE (mg/dL)    Urobilinogen, UA 1.0  0.0 - 1.0 (mg/dL)    Nitrite POSITIVE (*) NEGATIVE     Leukocytes, UA MODERATE (*) NEGATIVE    URINE MICROSCOPIC-ADD ON     Status: Abnormal   Collection Time   04/02/12 10:34 PM      Component Value Range Comment   Squamous Epithelial / LPF RARE  RARE     WBC, UA 3-6  <3 (WBC/hpf)    Bacteria, UA MANY (*) RARE    POCT PREGNANCY, URINE     Status: Abnormal   Collection Time   04/02/12 10:39 PM      Component Value Range Comment   Preg Test, Ur POSITIVE (*) NEGATIVE    PREPARE RBC (CROSSMATCH)     Status: Normal   Collection Time   04/02/12 10:40 PM      Component Value Range Comment   Order Confirmation ORDER PROCESSED BY BLOOD BANK     TYPE AND SCREEN     Status: Normal (Preliminary result)   Collection Time   04/02/12 10:40 PM      Component Value Range Comment   ABO/RH(D) O POS      Antibody Screen NEG      Sample Expiration 04/05/2012      Unit Number [id number]      Blood Component Type RED CELLS,LR      Unit division 00      Status of Unit ISSUED      Transfusion Status OK TO TRANSFUSE      Crossmatch Result Compatible      Unit Number [id number]      Blood Component Type RED CELLS,LR      Unit division 00      Status of  Unit ALLOCATED      Transfusion Status OK TO TRANSFUSE      Crossmatch Result Compatible     ABO/RH     Status: Normal (Preliminary result)   Collection Time   04/02/12 10:40 PM      Component Value Range Comment   ABO/RH(D) O POS     APTT     Status: Normal   Collection Time   04/02/12 10:43 PM      Component Value Range Comment   aPTT 31  24 - 37 (seconds)   PROTIME-INR     Status: Normal   Collection Time   04/02/12 10:43 PM      Component Value Range Comment   Prothrombin Time 13.5  11.6 - 15.2 (seconds)    INR 1.01  0.00 - 1.49    OCCULT BLOOD, POC  DEVICE     Status: Normal   Collection Time   04/02/12 10:44 PM      Component Value Range Comment   Fecal Occult Bld NEGATIVE     HEPATIC FUNCTION PANEL     Status: Abnormal   Collection Time   04/02/12 10:48 PM      Component Value Range Comment   Total Protein 7.5  6.0 - 8.3 (g/dL)    Albumin 4.0  3.5 - 5.2 (g/dL)    AST 26  0 - 37 (U/L)    ALT 7  0 - 35 (U/L)    Alkaline Phosphatase 66  39 - 117 (U/L)    Total Bilirubin 0.2 (*) 0.3 - 1.2 (mg/dL)    Bilirubin, Direct <7.8  0.0 - 0.3 (mg/dL)    Indirect Bilirubin NOT CALCULATED  0.3 - 0.9 (mg/dL)   RETICULOCYTES     Status: Normal   Collection Time   04/03/12 12:13 AM      Component Value Range Comment   Retic Ct Pct 0.8  0.4 - 3.1 (%)    RBC. 4.20  3.87 - 5.11 (MIL/uL)    Retic Count, Manual 33.6  19.0 - 186.0 (K/uL)    No results found.  Review of Systems  Constitutional: Positive for malaise/fatigue.  Gastrointestinal: Negative for blood in stool and melena.  Neurological: Positive for weakness.  All other systems reviewed and are negative.    Blood pressure 121/67, pulse 61, temperature 98.4 F (36.9 C), temperature source Oral, resp. rate 15, height 5\' 7"  (1.702 m), weight 56.7 kg (125 lb), SpO2 100.00%. Physical Exam  Constitutional: She is oriented to person, place, and time. She appears well-developed and well-nourished.  HENT:  Head: Normocephalic and atraumatic.  Right Ear: External ear normal.  Left Ear: External ear normal.  Nose: Nose normal.  Mouth/Throat: Oropharynx is clear and moist.  Eyes: Conjunctivae and EOM are normal. Pupils are equal, round, and reactive to light.  Neck: Normal range of motion. Neck supple.  Cardiovascular: Normal rate, regular rhythm, normal heart sounds and intact distal pulses.   Respiratory: Effort normal and breath sounds normal.  GI: Soft. Bowel sounds are normal.  Genitourinary: There is bleeding around the vagina. No vaginal discharge found.  Musculoskeletal: Normal range  of motion.  Neurological: She is alert and oriented to person, place, and time. She has normal reflexes.  Skin: Skin is warm and dry.  Psychiatric: She has a normal mood and affect. Her behavior is normal. Judgment and thought content normal.     Assessment/Plan Assessment this is a 46 year old female with known history of uterine fibroid  coming in with anemia and weakness. For symptomatic anemia more than likely the school going on chronically. She is having heavy bleeding is but no permanent fix has been done. Plan #1 severe anemia: Mother likely anemia of blood loss due to menorrhagia. Patient will be admitted check anemia panel in will have indices indicate iron deficiency. We'll transfuse 2 units of packed red blood cells of 4 with posttransfusion hemoglobin. Patient ultimately needs to have surgical intervention by her gynecologist to arrest further anemia of blood transfusions. #2 menorrhagia: She will follow up with Dr. Emelda Fear after discharge so he can schedule her for further workup of uterine fibroids. She will possibly get surgical intervention. #3 hypokalemia: Emesis. We'll replete her potassium. #4 emesis: The causes not entirely clear mother likely from the UTI. Will treat her UTI and follow closely. #5 Urinary tract infection: Will start Rocephin IV and transitioned patient to an oral medication once urine culture is back. Jax Kentner,LAWAL 04/03/2012, 12:40 AM

## 2012-04-05 LAB — TYPE AND SCREEN
ABO/RH(D): O POS
Antibody Screen: NEGATIVE
Unit division: 0
Unit division: 0
Unit division: 0
Unit division: 0

## 2012-04-05 LAB — URINE CULTURE
Colony Count: >=100000
Culture  Setup Time: 201304100030

## 2012-08-16 ENCOUNTER — Other Ambulatory Visit (HOSPITAL_COMMUNITY): Payer: Self-pay | Admitting: Family Medicine

## 2012-08-16 DIAGNOSIS — Z139 Encounter for screening, unspecified: Secondary | ICD-10-CM

## 2012-08-23 ENCOUNTER — Ambulatory Visit (HOSPITAL_COMMUNITY)
Admission: RE | Admit: 2012-08-23 | Discharge: 2012-08-23 | Disposition: A | Discharge status: Home / Self Care | Payer: 59 | Source: Ambulatory Visit | Attending: Family Medicine | Admitting: Family Medicine

## 2012-08-23 DIAGNOSIS — Z1231 Encounter for screening mammogram for malignant neoplasm of breast: Secondary | ICD-10-CM | POA: Insufficient documentation

## 2012-08-23 DIAGNOSIS — Z139 Encounter for screening, unspecified: Secondary | ICD-10-CM

## 2012-08-31 ENCOUNTER — Encounter (HOSPITAL_COMMUNITY): Payer: Self-pay | Admitting: Emergency Medicine

## 2012-08-31 ENCOUNTER — Emergency Department (HOSPITAL_COMMUNITY): Payer: 59

## 2012-08-31 ENCOUNTER — Emergency Department (HOSPITAL_COMMUNITY)
Admission: EM | Admit: 2012-08-31 | Discharge: 2012-08-31 | Disposition: A | Discharge status: Home / Self Care | Payer: 59 | Attending: Emergency Medicine | Admitting: Emergency Medicine

## 2012-08-31 DIAGNOSIS — R1030 Lower abdominal pain, unspecified: Secondary | ICD-10-CM

## 2012-08-31 DIAGNOSIS — N898 Other specified noninflammatory disorders of vagina: Secondary | ICD-10-CM | POA: Insufficient documentation

## 2012-08-31 DIAGNOSIS — IMO0001 Reserved for inherently not codable concepts without codable children: Secondary | ICD-10-CM

## 2012-08-31 DIAGNOSIS — D259 Leiomyoma of uterus, unspecified: Secondary | ICD-10-CM | POA: Insufficient documentation

## 2012-08-31 DIAGNOSIS — R109 Unspecified abdominal pain: Secondary | ICD-10-CM | POA: Insufficient documentation

## 2012-08-31 DIAGNOSIS — N939 Abnormal uterine and vaginal bleeding, unspecified: Secondary | ICD-10-CM

## 2012-08-31 LAB — URINALYSIS, ROUTINE W REFLEX MICROSCOPIC
Bilirubin Urine: NEGATIVE
Glucose, UA: NEGATIVE mg/dL
Ketones, ur: NEGATIVE mg/dL
Leukocytes, UA: NEGATIVE
Nitrite: NEGATIVE
Protein, ur: NEGATIVE mg/dL
Specific Gravity, Urine: 1.01 (ref 1.005–1.030)
Urobilinogen, UA: 0.2 mg/dL (ref 0.0–1.0)
pH: 7 (ref 5.0–8.0)

## 2012-08-31 LAB — WET PREP, GENITAL
Clue Cells Wet Prep HPF POC: NONE SEEN
Trich, Wet Prep: NONE SEEN
WBC, Wet Prep HPF POC: NONE SEEN
Yeast Wet Prep HPF POC: NONE SEEN

## 2012-08-31 LAB — CBC
HCT: 39.1 % (ref 36.0–46.0)
Hemoglobin: 12.8 g/dL (ref 12.0–15.0)
MCH: 27.5 pg (ref 26.0–34.0)
MCHC: 32.7 g/dL (ref 30.0–36.0)
MCV: 83.9 fL (ref 78.0–100.0)
Platelets: 176 10*3/uL (ref 150–400)
RBC: 4.66 MIL/uL (ref 3.87–5.11)
RDW: 14.7 % (ref 11.5–15.5)
WBC: 5 10*3/uL (ref 4.0–10.5)

## 2012-08-31 LAB — PREGNANCY, URINE: Preg Test, Ur: NEGATIVE

## 2012-08-31 LAB — URINE MICROSCOPIC-ADD ON

## 2012-08-31 MED ORDER — IBUPROFEN 600 MG PO TABS: 600.0000 mg | ORAL_TABLET | Freq: Four times a day (QID) | ORAL | Status: AC | PRN

## 2012-08-31 MED ORDER — IBUPROFEN 800 MG PO TABS
800.0000 mg | ORAL_TABLET | Freq: Once | ORAL | Status: AC
  Administered 2012-08-31: 800 mg via ORAL
  Filled 2012-08-31: qty 1

## 2012-08-31 NOTE — ED Notes (Signed)
Patient unable to give urine specimen at this time - stated she had just used the bathroom.  Advised her as soon as she could we needed a specimen.

## 2012-08-31 NOTE — ED Notes (Signed)
Pt states she was sent here by Dr. Sherwood Gambler for a swollen uterus. Pt states she has been bleeding heavy since Thursday with abd pain. Pt states she has a history fibroids.

## 2012-08-31 NOTE — ED Notes (Signed)
Pt. Taken to ultrasound

## 2012-08-31 NOTE — ED Provider Notes (Signed)
History  This chart was scribed for Jones Skene, MD by Bennett Scrape. This patient was seen in room APA03/APA03 and the patient's care was started at 11:12AM.  CSN: [id number]  Arrival date & time 08/31/12  1038   First MD Initiated Contact with Patient 08/31/12 1112      Chief Complaint  Patient presents with  . Abdominal Pain  . Vaginal Bleeding     The history is provided by the patient. No language interpreter was used.    Vicki [name] is a 46 y.o. female with a h/o uterine fibroids who presents to the Emergency Department complaining of gradual onset, gradually worsening abdominal pain described as sharp and contracting with associated heavy vaginal bleeding since yesterday. She rates her pain a 6 out of 10 currently and states that it is non-radiating. She denies taking OTC medications at home to improve symptoms. She reports that she started her LNMP 2 days ago with a normal amount of pain and bleeding that increased overnight. She was seen by Dr. Sherwood Gambler today and sent here for further evaluation for a possible bleeding fibroid. She denies dizziness, cough, dysuria, nausea, vomiting, diarrhea, or loss of appetite as associated symptoms. She denies any recent illnesses. She also denies the possibility of pregnancy. She has a h/o depression and anemia with prior blood transfusions. She reports that she currently takes HTN and antidepressant medications. She is an occasional alcohol user but denies smoking.  Past Medical History  Diagnosis Date  . Anemia   . Fibroids     uterine    Past Surgical History  Procedure Date  . Tubal ligation     History reviewed. No pertinent family history.  OB history provided by pt at bedside- G5:P5  History  Substance Use Topics  . Smoking status: Never Smoker   . Smokeless tobacco: Not on file  . Alcohol Use: Yes    Review of Systems  Positive for vaginal bleeding and abdominal pain, Patient denies any fevers or chills,  changes in vision, earache, sore throat, neck pain or stiffness, chest pain or pressure, palpitations, syncope, dyspnea, cough, wheezing, nausea, vomiting, diarrhea, melena, red bloody stools, frequency, dysuria, myalgias, arthralgias, back pain, recent trauma, rash, itching, skin lesions, easy bruising or bleeding, headache, seizures, numbness, tingling or weakness.   Allergies  Review of patient's allergies indicates no known allergies.  Home Medications   Current Outpatient Rx  Name Route Sig Dispense Refill  . ALKA-SELTZER PLUS COLD PO Oral Take 2 Wafers by mouth 2 (two) times daily as needed. For cold symptoms    . FERROUS SULFATE 325 (65 FE) MG PO TABS Oral Take 1 tablet (325 mg total) by mouth 3 (three) times daily with meals. 90 tablet 0  . PANTOPRAZOLE SODIUM 40 MG PO TBEC Oral Take 1 tablet (40 mg total) by mouth daily. 30 tablet 0    Triage Vitals: BP 128/80  Pulse 64  Temp 98.1 F (36.7 C) (Oral)  Resp 16  Ht 5\' 7"  (1.702 m)  Wt 132 lb (59.875 kg)  BMI 20.67 kg/m2  SpO2 100%  LMP 07/29/2012  Physical Exam  Nursing note and vitals reviewed.  PHYSICAL EXAM: VITAL SIGNS:   Filed Vitals:   08/31/12 1353  BP: 116/63  Pulse: 54  Temp: 98.1 F (36.7 C)  Resp: 20   CONSTITUTIONAL: Awake, oriented, appears non-toxic HENT: Atraumatic, normocephalic, oral mucosa pink and moist, airway patent. Nares patent without drainage. External ears normal. EYES: Conjunctiva clear, EOMI, PERRLA NECK:  Trachea midline, non-tender, supple CARDIOVASCULAR: Normal heart rate, Normal rhythm, No murmurs, rubs, gallops PULMONARY/CHEST: Clear to auscultation, no rhonchi, wheezes, or rales. Symmetrical breath sounds. CHEST WALL: No lesions. Non-tender. ABDOMINAL: Non-distended, soft, tenderness to the right suprapubic region.  There is a pear sized oblong mass palpable in the right suprapubic region.  No rebound or guarding.  BS normal. EXTREMITIES: No clubbing, cyanosis, or edema SKIN:  Warm, Dry, No erythema, No rash   ED Course  Procedures (including critical care time)  DIAGNOSTIC STUDIES: Oxygen Saturation is 100% on room air, normal by my interpretation.    COORDINATION OF CARE: 11:40AM-Fast [place] to Abdomen-- 7 by 12 cm fibroid noted  11:50AM-Discussed treatment plan which include a pelvic exam and ibuprofen with pt at bedside and pt agreed to plan.  Labs Reviewed  URINALYSIS, ROUTINE W REFLEX MICROSCOPIC - Abnormal; Notable for the following:    Hgb urine dipstick MODERATE (*)     All other components within normal limits  URINE MICROSCOPIC-ADD ON - Abnormal; Notable for the following:    Squamous Epithelial / LPF FEW (*)     Bacteria, UA FEW (*)     All other components within normal limits  CBC  PREGNANCY, URINE  WET PREP, GENITAL  GC/CHLAMYDIA PROBE AMP, GENITAL   US Transvaginal Non-ob  08/31/2012  *RADIOLOGY REPORT*  Clinical Data: Abdominal pain and vaginal bleeding.  Fibroids.  TRANSABDOMINAL AND TRANSVAGINAL ULTRASOUND OF PELVIS Technique:  Both transabdominal and transvaginal ultrasound examinations of the pelvis were performed. Transabdominal technique was performed for global imaging of the pelvis including uterus, ovaries, adnexal regions, and pelvic cul-de-sac.  It was necessary to proceed with endovaginal exam following the transabdominal exam to visualize the endometrium.  Comparison:  CT abdomen pelvis 03/31/2010  Findings:  Uterus: Enlarged and anteverted.  Measures 13.0 x 5.9 x 10.8 cm. Diffusely heterogeneous, and contains multiple fibroids.  Within the central aspect of the uterus, in the uterine body is a submucosal 2.6 x 2.7 x 2.9 cm fibroid.  Left fundal subserosal fibroid measures 4.4 x 4.7 x 4.3 cm.  There are additional smaller scattered fibroids.  Endometrium: The endometrial thickness is normal.  Measures 7.6 mm.  Right ovary:  Normal appearance/no adnexal mass  Left ovary: Normal appearance/no adnexal mass  Other findings: No free fluid.   IMPRESSION:  1.  Enlarged fibroid uterus.  A submucosal fibroid within the central uterus measures 2.9 cm maximal diameter, and may contribute to the patient's dysfunctional uterine bleeding.  The largest uterine fibroid is 4.7 cm, subserosal in the left fundus. 2.  Ovaries within normal limits. 3.  Normal endometrial thickness.   Original Report Authenticated By: Britta Mccreedy, M.D.    US Pelvis Complete  08/31/2012  *RADIOLOGY REPORT*  Clinical Data: Abdominal pain and vaginal bleeding.  Fibroids.  TRANSABDOMINAL AND TRANSVAGINAL ULTRASOUND OF PELVIS Technique:  Both transabdominal and transvaginal ultrasound examinations of the pelvis were performed. Transabdominal technique was performed for global imaging of the pelvis including uterus, ovaries, adnexal regions, and pelvic cul-de-sac.  It was necessary to proceed with endovaginal exam following the transabdominal exam to visualize the endometrium.  Comparison:  CT abdomen pelvis 03/31/2010  Findings:  Uterus: Enlarged and anteverted.  Measures 13.0 x 5.9 x 10.8 cm. Diffusely heterogeneous, and contains multiple fibroids.  Within the central aspect of the uterus, in the uterine body is a submucosal 2.6 x 2.7 x 2.9 cm fibroid.  Left fundal subserosal fibroid measures 4.4 x 4.7 x 4.3 cm.  There  are additional smaller scattered fibroids.  Endometrium: The endometrial thickness is normal.  Measures 7.6 mm.  Right ovary:  Normal appearance/no adnexal mass  Left ovary: Normal appearance/no adnexal mass  Other findings: No free fluid.  IMPRESSION:  1.  Enlarged fibroid uterus.  A submucosal fibroid within the central uterus measures 2.9 cm maximal diameter, and may contribute to the patient's dysfunctional uterine bleeding.  The largest uterine fibroid is 4.7 cm, subserosal in the left fundus. 2.  Ovaries within normal limits. 3.  Normal endometrial thickness.   Original Report Authenticated By: Britta Mccreedy, M.D.    1. Fibroid (bleeding) (uterine)   2. Abdominal  pain, lower   3. Vaginal bleeding     MDM  Vicki [name] is a 46 y.o. female presenting at the request of her PCP for likely uterine fibroids and dysmenorrhea.  Based on the H&P, pelvic exam, and Pelvic US, I think her pain is coming from her large fibroids.  When I palpate the fribroids on bimanual exam or on bedside ultrasound, it re-creates her pain.  H&H WNL.  Differential diagnosis of her lower abdominal considerations include pelvic inflammatory disease, ectopic pregnancy, appendicitis, urinary calculi, primary dysmenorrhea, septic abortion, ruptured ovarian cyst or tumor, ovarian torsion, tubo-ovarian abscess, degeneration of fibroid, endometriosis, diverticulitis, cystitis. I don't think she has appendicitis, obstruction, perforation, AGE or diverticulitis in addition causing pain in the RLQ. The patient has an appetite, has had no N/V/D and her US shows no evidence of TOA, No purulent DC on pelvic exam, no CMT or peritoneal signs.  No cysts or tumors were seen on [place].  Pt is not pregnant.  PT isn't age appropriate for diverticulitis and her exam is most c/w fibroid disease causing dysmenorrhea.  I think a CT is unnecessary to r/o appendicitis, but she was cautioned if symptoms persists, worsen or if she develops worsening pain, fever, N/V to return - or for any other concerning symptoms..  I explained the diagnosis. The patient understands and accepts the medical plan as it's been dictated and I have answered their questions. Discharge instructions concerning home care and prescriptions have been given.  The patient is STABLE and is discharged to home in good condition.  The chart was dictated and assembled with the help of a scribe, it has been reviewed Jones Skene, M.D.        Jones Skene, MD 09/02/12 2007

## 2012-09-02 LAB — GC/CHLAMYDIA PROBE AMP, GENITAL
Chlamydia, DNA Probe: NEGATIVE
GC Probe Amp, Genital: NEGATIVE

## 2013-02-13 ENCOUNTER — Other Ambulatory Visit: Payer: Self-pay | Admitting: Obstetrics and Gynecology

## 2013-02-13 ENCOUNTER — Other Ambulatory Visit (HOSPITAL_COMMUNITY)
Admission: RE | Admit: 2013-02-13 | Discharge: 2013-02-13 | Disposition: A | Discharge status: Home / Self Care | Payer: 59 | Source: Ambulatory Visit | Attending: Obstetrics and Gynecology | Admitting: Obstetrics and Gynecology

## 2013-02-13 DIAGNOSIS — Z113 Encounter for screening for infections with a predominantly sexual mode of transmission: Secondary | ICD-10-CM | POA: Insufficient documentation

## 2013-02-13 DIAGNOSIS — Z01419 Encounter for gynecological examination (general) (routine) without abnormal findings: Secondary | ICD-10-CM | POA: Insufficient documentation

## 2013-02-13 DIAGNOSIS — Z1151 Encounter for screening for human papillomavirus (HPV): Secondary | ICD-10-CM | POA: Insufficient documentation

## 2013-04-03 ENCOUNTER — Telehealth: Payer: Self-pay | Admitting: Adult Health

## 2013-04-03 NOTE — Telephone Encounter (Signed)
Left message on voice mail to call in am.

## 2013-04-18 ENCOUNTER — Emergency Department (HOSPITAL_COMMUNITY)
Admission: EM | Admit: 2013-04-18 | Discharge: 2013-04-18 | Disposition: A | Discharge status: Home / Self Care | Payer: 59 | Attending: Emergency Medicine | Admitting: Emergency Medicine

## 2013-04-18 ENCOUNTER — Encounter (HOSPITAL_COMMUNITY): Payer: Self-pay | Admitting: *Deleted

## 2013-04-18 ENCOUNTER — Emergency Department (HOSPITAL_COMMUNITY): Payer: 59

## 2013-04-18 DIAGNOSIS — M545 Low back pain, unspecified: Secondary | ICD-10-CM | POA: Insufficient documentation

## 2013-04-18 DIAGNOSIS — R109 Unspecified abdominal pain: Secondary | ICD-10-CM

## 2013-04-18 DIAGNOSIS — Z79899 Other long term (current) drug therapy: Secondary | ICD-10-CM | POA: Insufficient documentation

## 2013-04-18 DIAGNOSIS — D649 Anemia, unspecified: Secondary | ICD-10-CM | POA: Insufficient documentation

## 2013-04-18 DIAGNOSIS — Z3202 Encounter for pregnancy test, result negative: Secondary | ICD-10-CM | POA: Insufficient documentation

## 2013-04-18 DIAGNOSIS — N898 Other specified noninflammatory disorders of vagina: Secondary | ICD-10-CM | POA: Insufficient documentation

## 2013-04-18 DIAGNOSIS — R300 Dysuria: Secondary | ICD-10-CM | POA: Insufficient documentation

## 2013-04-18 DIAGNOSIS — K5900 Constipation, unspecified: Secondary | ICD-10-CM | POA: Insufficient documentation

## 2013-04-18 DIAGNOSIS — Z9851 Tubal ligation status: Secondary | ICD-10-CM | POA: Insufficient documentation

## 2013-04-18 DIAGNOSIS — N390 Urinary tract infection, site not specified: Secondary | ICD-10-CM | POA: Insufficient documentation

## 2013-04-18 DIAGNOSIS — D259 Leiomyoma of uterus, unspecified: Secondary | ICD-10-CM

## 2013-04-18 DIAGNOSIS — R110 Nausea: Secondary | ICD-10-CM | POA: Insufficient documentation

## 2013-04-18 DIAGNOSIS — R1031 Right lower quadrant pain: Secondary | ICD-10-CM | POA: Insufficient documentation

## 2013-04-18 LAB — CBC WITH DIFFERENTIAL/PLATELET
Basophils Absolute: 0 10*3/uL (ref 0.0–0.1)
Basophils Relative: 1 % (ref 0–1)
Eosinophils Absolute: 0.2 10*3/uL (ref 0.0–0.7)
Eosinophils Relative: 3 % (ref 0–5)
HCT: 36.2 % (ref 36.0–46.0)
Hemoglobin: 12.1 g/dL (ref 12.0–15.0)
Lymphocytes Relative: 16 % (ref 12–46)
Lymphs Abs: 1.3 10*3/uL (ref 0.7–4.0)
MCH: 25.7 pg — ABNORMAL LOW (ref 26.0–34.0)
MCHC: 33.4 g/dL (ref 30.0–36.0)
MCV: 76.9 fL — ABNORMAL LOW (ref 78.0–100.0)
Monocytes Absolute: 0.5 10*3/uL (ref 0.1–1.0)
Monocytes Relative: 6 % (ref 3–12)
Neutro Abs: 5.8 10*3/uL (ref 1.7–7.7)
Neutrophils Relative %: 74 % (ref 43–77)
Platelets: 309 10*3/uL (ref 150–400)
RBC: 4.71 MIL/uL (ref 3.87–5.11)
RDW: 16.2 % — ABNORMAL HIGH (ref 11.5–15.5)
WBC: 7.8 10*3/uL (ref 4.0–10.5)

## 2013-04-18 LAB — BASIC METABOLIC PANEL
BUN: 7 mg/dL (ref 6–23)
CO2: 22 mEq/L (ref 19–32)
Calcium: 9.1 mg/dL (ref 8.4–10.5)
Chloride: 105 mEq/L (ref 96–112)
Creatinine, Ser: 0.88 mg/dL (ref 0.50–1.10)
GFR calc Af Amer: 89 mL/min — ABNORMAL LOW (ref >90)
GFR calc non Af Amer: 76 mL/min — ABNORMAL LOW (ref >90)
Glucose, Bld: 87 mg/dL (ref 70–99)
Potassium: 3.6 mEq/L (ref 3.5–5.1)
Sodium: 136 mEq/L (ref 135–145)

## 2013-04-18 LAB — URINALYSIS, ROUTINE W REFLEX MICROSCOPIC
Bilirubin Urine: NEGATIVE
Glucose, UA: NEGATIVE mg/dL
Hgb urine dipstick: NEGATIVE
Ketones, ur: NEGATIVE mg/dL
Leukocytes, UA: NEGATIVE
Nitrite: POSITIVE — AB
Protein, ur: NEGATIVE mg/dL
Specific Gravity, Urine: 1.02 (ref 1.005–1.030)
Urobilinogen, UA: 0.2 mg/dL (ref 0.0–1.0)
pH: 6 (ref 5.0–8.0)

## 2013-04-18 LAB — URINE MICROSCOPIC-ADD ON

## 2013-04-18 LAB — POCT PREGNANCY, URINE: Preg Test, Ur: NEGATIVE

## 2013-04-18 MED ORDER — HYDROCODONE-ACETAMINOPHEN 5-325 MG PO TABS: 1.0000 | ORAL_TABLET | Freq: Four times a day (QID) | ORAL | Status: DC | PRN

## 2013-04-18 MED ORDER — IOHEXOL 300 MG/ML  SOLN
50.0000 mL | Freq: Once | INTRAMUSCULAR | Status: AC | PRN
  Administered 2013-04-18: 50 mL via ORAL

## 2013-04-18 MED ORDER — PROMETHAZINE HCL 25 MG PO TABS: 25.0000 mg | ORAL_TABLET | Freq: Four times a day (QID) | ORAL | Status: DC | PRN

## 2013-04-18 MED ORDER — CEPHALEXIN 500 MG PO CAPS: 500.0000 mg | ORAL_CAPSULE | Freq: Four times a day (QID) | ORAL | Status: DC

## 2013-04-18 MED ORDER — DEXTROSE 5 % IV SOLN
1.0000 g | Freq: Once | INTRAVENOUS | Status: AC
  Administered 2013-04-18: 1 g via INTRAVENOUS
  Filled 2013-04-18: qty 10

## 2013-04-18 MED ORDER — IOHEXOL 300 MG/ML  SOLN
100.0000 mL | Freq: Once | INTRAMUSCULAR | Status: AC | PRN
  Administered 2013-04-18: 100 mL via INTRAVENOUS

## 2013-04-18 MED ORDER — HYDROMORPHONE HCL PF 1 MG/ML IJ SOLN: 1.0000 mg | Freq: Once | INTRAMUSCULAR | Status: DC

## 2013-04-18 MED ORDER — SODIUM CHLORIDE 0.9 % IV SOLN
Freq: Once | INTRAVENOUS | Status: AC
  Administered 2013-04-18: 08:00:00 via INTRAVENOUS

## 2013-04-18 MED ORDER — ONDANSETRON HCL 4 MG/2ML IJ SOLN
4.0000 mg | Freq: Once | INTRAMUSCULAR | Status: AC
  Administered 2013-04-18: 4 mg via INTRAVENOUS
  Filled 2013-04-18: qty 2

## 2013-04-18 NOTE — ED Notes (Signed)
Pt states she started a new birth control 2 months ago and states vaginal bleeding x 3 weeks. Heavy for past two weeks. Lower abdominal/pelvic pain radiating down right leg also. Pt states she is lightheaded.

## 2013-04-18 NOTE — ED Provider Notes (Signed)
History  This chart was scribed for [name], MD by Lacey Jensen, ED Scribe. The patient was seen in room APA19/APA19. Patient's care was started at 0723.   CSN: [id number]  Arrival date & time 04/18/13  0710   First MD Initiated Contact with Patient 04/18/13 [phone]      Chief Complaint  Patient presents with  . Vaginal Bleeding  . Abdominal Pain     Patient is a 47 y.o. female presenting with vaginal bleeding and abdominal pain. The history is provided by the patient. No language interpreter was used.  Vaginal Bleeding This is a recurrent problem. The current episode started more than 1 week ago. The problem occurs constantly. The problem has not changed since onset.Associated symptoms include abdominal pain. Pertinent negatives include no headaches and no shortness of breath. Nothing aggravates the symptoms. Nothing relieves the symptoms. She has tried nothing for the symptoms. The treatment provided no relief.  Abdominal Pain Pain location:  RLQ Pain quality: sharp   Pain radiates to:  R leg Pain severity:  Moderate Onset quality:  Gradual Duration:  1 day Timing:  Constant Progression:  Unchanged Chronicity:  New Relieved by:  Nothing Worsened by:  Nothing tried Ineffective treatments:  None tried Associated symptoms: constipation, dysuria, nausea and vaginal bleeding   Associated symptoms: no chills, no cough, no fever and no shortness of breath     Vicki [name] is a 47 y.o. female with a history of anemia and fibroids who presents to the Emergency Department complaining of severe, sharp RLQ  abdominal pain that radiates to right knee, with onset yesterday.Patient is currently on new birth control pills since March 7th for vaginal bleeding. States that the vaginal bleeding started 3 weeks ago and has been persistent for the month of April. She rates her pain is 7/10 currently and 8/10 at worst. She mentions having a  "twing" in her lower back. Patient has nausea,  constipation, dysuria. Denies diarrhea, fever, no visual changes, CP, SOB, neck pain, leg swelling, rash, and any other symptoms.   PCP- McGough   Past Medical History  Diagnosis Date  . Anemia   . Fibroids     uterine    Past Surgical History  Procedure Laterality Date  . Tubal ligation      No family history on file.  History  Substance Use Topics  . Smoking status: Never Smoker   . Smokeless tobacco: Not on file  . Alcohol Use: Yes    OB History   Grav Para Term Preterm Abortions TAB SAB Ect Mult Living                  Review of Systems  Constitutional: Negative for fever and chills.  HENT: Negative for congestion and rhinorrhea.   Respiratory: Negative for cough and shortness of breath.   Gastrointestinal: Positive for nausea, abdominal pain and constipation.  Genitourinary: Positive for dysuria and vaginal bleeding.  Musculoskeletal: Positive for back pain.  Skin: Negative for rash.  Neurological: Negative for headaches.  Psychiatric/Behavioral: Negative for confusion.  All other systems reviewed and are negative.    Allergies  Review of patient's allergies indicates no known allergies.  Home Medications   Current Outpatient Rx  Name  Route  Sig  Dispense  Refill  . ALPRAZolam (XANAX) 0.5 MG tablet   Oral   Take 1 tablet by mouth at bedtime.          . citalopram (CELEXA) 20 MG tablet  Oral   Take 1 tablet by mouth at bedtime.          . ferrous sulfate 325 (65 FE) MG tablet   Oral   Take 1 tablet (325 mg total) by mouth 3 (three) times daily with meals.   90 tablet   0   . Levonorgestrel-Ethinyl Estradiol (SEASONIQUE) 0.15-0.03 &0.01 MG tablet   Oral   Take 1 tablet by mouth daily.         . cephALEXin (KEFLEX) 500 MG capsule   Oral   Take 1 capsule (500 mg total) by mouth 4 (four) times daily.   28 capsule   0   . HYDROcodone-acetaminophen (NORCO/VICODIN) 5-325 MG per tablet   Oral   Take 1-2 tablets by mouth every 6 (six)  hours as needed for pain.   14 tablet   0   . promethazine (PHENERGAN) 25 MG tablet   Oral   Take 1 tablet (25 mg total) by mouth every 6 (six) hours as needed for nausea.   12 tablet   0     Triage Vitals: BP 142/83  Pulse 83  Temp(Src) 98.1 F (36.7 C) (Oral)  Resp 16  Ht 5\' 7"  (1.702 m)  Wt 135 lb (61.236 kg)  BMI 21.14 kg/m2  SpO2 100%  LMP 04/18/2013  Physical Exam  Constitutional: She is oriented to person, place, and time. She appears well-developed and well-nourished.  HENT:  Head: Normocephalic and atraumatic.  Eyes:  Conjucti of eyes are pale  Cardiovascular: Normal rate, regular rhythm and normal heart sounds.   No murmur heard. Pulmonary/Chest: Effort normal and breath sounds normal. No respiratory distress. She has no wheezes. She has no rales. She exhibits no tenderness.  Abdominal: Soft. Bowel sounds are normal. She exhibits no distension. There is tenderness (RLQ. right flank). There is no rebound and no guarding.  Neurological: She is alert and oriented to person, place, and time. She has normal reflexes. No cranial nerve deficit. Coordination normal.  Skin: Skin is warm and dry.  Psychiatric: She has a normal mood and affect.    ED Course  Procedures (including critical care time) DIAGNOSTIC STUDIES: Oxygen Saturation is 100% on room air, normal by my interpretation.    COORDINATION OF CARE: 7:27 AM-Discussed treatment plan  with pt at bedside and pt agreed to plan.      Medications  HYDROmorphone (DILAUDID) injection 1 mg (0 mg Intravenous Hold 04/18/13 0812)  0.9 %  sodium chloride infusion ( Intravenous New Bag/Given 04/18/13 0818)  ondansetron (ZOFRAN) injection 4 mg (4 mg Intravenous Given 04/18/13 0818)  iohexol (OMNIPAQUE) 300 MG/ML solution 50 mL (50 mLs Oral Contrast Given 04/18/13 0841)  cefTRIAXone (ROCEPHIN) 1 g in dextrose 5 % 50 mL IVPB (0 g Intravenous Stopped 04/18/13 0944)  iohexol (OMNIPAQUE) 300 MG/ML solution 100 mL (100 mLs  Intravenous Contrast Given 04/18/13 0900)    Labs Reviewed  CBC WITH DIFFERENTIAL - Abnormal; Notable for the following:    MCV 76.9 (*)    MCH 25.7 (*)    RDW 16.2 (*)    All other components within normal limits  BASIC METABOLIC PANEL - Abnormal; Notable for the following:    GFR calc non Af Amer 76 (*)    GFR calc Af Amer 89 (*)    All other components within normal limits  URINALYSIS, ROUTINE W REFLEX MICROSCOPIC - Abnormal; Notable for the following:    Nitrite POSITIVE (*)    All other components within normal  limits  URINE MICROSCOPIC-ADD ON - Abnormal; Notable for the following:    Bacteria, UA MANY (*)    All other components within normal limits  POCT PREGNANCY, URINE   Ct Abdomen Pelvis W Contrast  04/18/2013  *RADIOLOGY REPORT*  Clinical Data: Vaginal bleeding.  Right-sided abdominal pain for 3 weeks.  CT ABDOMEN AND PELVIS WITH CONTRAST  Technique:  Multidetector CT imaging of the abdomen and pelvis was performed following the standard protocol during bolus administration of intravenous contrast.  Contrast: 50mL OMNIPAQUE IOHEXOL 300 MG/ML  SOLN, OMNIPAQUE IOHEXOL 300 MG/ML  SOLN  Comparison: 03/31/2010.  Findings: Lung Bases: Dependent atelectasis.  Liver:  Tiny low density lesion in the right hepatic lobe (image number 18 series 2) appears slightly larger than on the prior exam. This probably represents a small benign cyst.  Unchanged benign cysts in the right hepatic dome (image #9 series 2).  Spleen:  Normal.  Gallbladder:  Normal.  Common bile duct:  Normal.  Pancreas:  Normal.  Adrenal glands:  Normal.  Kidneys:  Normal enhancement and delayed excretion of contrast. Tiny sub-centimeter cyst in the interpolar right kidney.  Stomach:  Distended with oral contrast.  No inflammatory changes.  Small bowel:  Normal.  No mesenteric adenopathy.  Colon:   Normal appendix.  Moderate stool burden.  Pelvic Genitourinary:  There is no free fluid.  The uterus is markedly enlarged with  multiple peripherally enhancing lesions compatible with fibroids.  Many of these demonstrate nonenhancing low attenuation centrally compatible with necrosis.  Urinary bladder appears normal.  There is mass effect on the superior aspect of the urinary bladder from the markedly enlarged uterus. Uterus measures 10.8 cm cranial-caudal, 10.4 cm transverse and 9.9 cm AP.  Bones:  No aggressive osseous lesions.  Vasculature: Normal.  IMPRESSION:  1. Markedly enlarged fibroid uterus, enlarged from the prior exam, with multiple necrotic fibroids.  Some of these are subendometrial in position and probably contribute to abnormal uterine bleeding. 2.  Small low density hepatic and renal lesions compatible with small benign cysts.   Original Report Authenticated By: Andreas Newport, M.D.    Results for orders placed during the hospital encounter of 04/18/13  CBC WITH DIFFERENTIAL      Result Value Range   WBC 7.8  4.0 - 10.5 K/uL   RBC 4.71  3.87 - 5.11 MIL/uL   Hemoglobin 12.1  12.0 - 15.0 g/dL   HCT 95.1  88.4 - 16.6 %   MCV 76.9 (*) 78.0 - 100.0 fL   MCH 25.7 (*) 26.0 - 34.0 pg   MCHC 33.4  30.0 - 36.0 g/dL   RDW 06.3 (*) 01.6 - 01.0 %   Platelets 309  150 - 400 K/uL   Neutrophils Relative 74  43 - 77 %   Neutro Abs 5.8  1.7 - 7.7 K/uL   Lymphocytes Relative 16  12 - 46 %   Lymphs Abs 1.3  0.7 - 4.0 K/uL   Monocytes Relative 6  3 - 12 %   Monocytes Absolute 0.5  0.1 - 1.0 K/uL   Eosinophils Relative 3  0 - 5 %   Eosinophils Absolute 0.2  0.0 - 0.7 K/uL   Basophils Relative 1  0 - 1 %   Basophils Absolute 0.0  0.0 - 0.1 K/uL  BASIC METABOLIC PANEL      Result Value Range   Sodium 136  135 - 145 mEq/L   Potassium 3.6  3.5 - 5.1 mEq/L  Chloride 105  96 - 112 mEq/L   CO2 22  19 - 32 mEq/L   Glucose, Bld 87  70 - 99 mg/dL   BUN 7  6 - 23 mg/dL   Creatinine, Ser 8.11  0.50 - 1.10 mg/dL   Calcium 9.1  8.4 - 91.4 mg/dL   GFR calc non Af Amer 76 (*) >90 mL/min   GFR calc Af Amer 89 (*) >90 mL/min   URINALYSIS, ROUTINE W REFLEX MICROSCOPIC      Result Value Range   Color, Urine YELLOW  YELLOW   APPearance CLEAR  CLEAR   Specific Gravity, Urine 1.020  1.005 - 1.030   pH 6.0  5.0 - 8.0   Glucose, UA NEGATIVE  NEGATIVE mg/dL   Hgb urine dipstick NEGATIVE  NEGATIVE   Bilirubin Urine NEGATIVE  NEGATIVE   Ketones, ur NEGATIVE  NEGATIVE mg/dL   Protein, ur NEGATIVE  NEGATIVE mg/dL   Urobilinogen, UA 0.2  0.0 - 1.0 mg/dL   Nitrite POSITIVE (*) NEGATIVE   Leukocytes, UA NEGATIVE  NEGATIVE  URINE MICROSCOPIC-ADD ON      Result Value Range   RBC / HPF 0-2  <3 RBC/hpf   Bacteria, UA MANY (*) RARE  POCT PREGNANCY, URINE      Result Value Range   Preg Test, Ur NEGATIVE  NEGATIVE     1. Abdominal pain   2. Uterine fibroid   3. Urinary tract infection       MDM  Patient's workup today consistent with urinary tract infection positive nitrite. Received Rocephin 1 g IV piggyback in the emergency department we'll continue Keflex for this. The dysfunctional uterine bleeding without any evidence of significant anemia at this point in time. CT scan does show multiple uterine fibroids some necrotic some in the endometrial area all probably related to the discomfort and also the bleeding. We'll need to follow back up with Dr. Emelda Fear he is treating you hormonally for the vaginal bleeding and may need to be discussing with him the need for hysterectomy. However he is not significantly anemic today. Pain medication antinausea medicine provided. No evidence of appendicitis no evidence of adnexal abnormality.      I personally performed the services described in this documentation, which was scribed in my presence. The recorded information has been reviewed and is accurate.     [name], MD 04/18/13 1003

## 2013-04-18 NOTE — ED Notes (Signed)
States that Vicki [name] in her Obgyn physician, states that she started Seasonale birth control in March 2014 for bleeding problems.  States that that has started bleeding at the beginning of April, states that she has not seen Dr. Emelda [name] again for this problem.  Dr. Deretha Emory at bedside for evaluation.  The patient states that she is having right lower abdominal pain that radiates into her right leg to her knee, states that the pain was there yesterday, but worse today, describes the pain as sharp, 7/10 currently, states that she does have nausea and constipation.  States she has had painful urination.

## 2013-04-18 NOTE — ED Notes (Signed)
Patient with no complaints at this time. Respirations even and unlabored. Skin warm/dry. Discharge instructions reviewed with patient at this time. Patient given opportunity to voice concerns/ask questions. IV removed per policy and band-aid applied to site. Patient discharged at this time and left Emergency Department with steady gait.  

## 2013-04-29 ENCOUNTER — Telehealth: Payer: Self-pay | Admitting: Obstetrics and Gynecology

## 2013-05-01 DIAGNOSIS — Z029 Encounter for administrative examinations, unspecified: Secondary | ICD-10-CM

## 2013-05-02 ENCOUNTER — Ambulatory Visit: Payer: Self-pay | Admitting: Adult Health

## 2013-05-02 NOTE — Telephone Encounter (Signed)
Pt came to office and was informed FMLA forms not completed would need to discuss with Cyril Mourning, NP.

## 2013-05-08 ENCOUNTER — Encounter: Payer: Self-pay | Admitting: *Deleted

## 2013-05-09 ENCOUNTER — Ambulatory Visit: Payer: Self-pay | Admitting: Adult Health

## 2013-05-16 ENCOUNTER — Encounter: Payer: Self-pay | Admitting: Adult Health

## 2013-05-16 ENCOUNTER — Ambulatory Visit (INDEPENDENT_AMBULATORY_CARE_PROVIDER_SITE_OTHER): Payer: 59 | Admitting: Adult Health

## 2013-05-16 VITALS — BP 140/80 | Ht 67.0 in | Wt 131.0 lb

## 2013-05-16 DIAGNOSIS — D219 Benign neoplasm of connective and other soft tissue, unspecified: Secondary | ICD-10-CM | POA: Insufficient documentation

## 2013-05-16 DIAGNOSIS — A499 Bacterial infection, unspecified: Secondary | ICD-10-CM

## 2013-05-16 DIAGNOSIS — B9689 Other specified bacterial agents as the cause of diseases classified elsewhere: Secondary | ICD-10-CM

## 2013-05-16 DIAGNOSIS — N760 Acute vaginitis: Secondary | ICD-10-CM

## 2013-05-16 DIAGNOSIS — N898 Other specified noninflammatory disorders of vagina: Secondary | ICD-10-CM

## 2013-05-16 DIAGNOSIS — D259 Leiomyoma of uterus, unspecified: Secondary | ICD-10-CM

## 2013-05-16 HISTORY — DX: Other specified bacterial agents as the cause of diseases classified elsewhere: B96.89

## 2013-05-16 LAB — POCT WET PREP (WET MOUNT)
Clue Cells Wet Prep Whiff POC: POSITIVE
Trichomonas Wet Prep HPF POC: NEGATIVE
WBC, Wet Prep HPF POC: POSITIVE

## 2013-05-16 MED ORDER — METRONIDAZOLE 500 MG PO TABS: 500.0000 mg | ORAL_TABLET | Freq: Two times a day (BID) | ORAL | Status: DC

## 2013-05-16 NOTE — Progress Notes (Signed)
Subjective:     Patient ID: Vicki [name], female   DOB: [birth year], 47 y.o.   MRN: [id number]  HPI Vicki [name] is a 47 year old black female in complaining of vaginal discharge and odor.She had been off OCs and was given a pack of lo loestrin til she could get seasonique, and her bleeding stopped.She has a history of uterine fibroids.  Review of Systems Positives as above Reviewed past medical,surgical, social and family history. Reviewed medications and allergies.     Objective:   Physical Exam Blood pressure 140/80, height 5\' 7"  (1.702 m), weight 131 lb (59.421 kg), last menstrual period 04/18/2013. Skin warm and dry.Pelvic: external genitalia is normal in appearance, vagina: pinkish discharge with odor, cervix:smooth and bulbous, uterus: is enlarged about 18 week size, non tender, she has what I feel is 2 large anterior and fundal fibroids, adnexa: no masses or tenderness noted. Wet prep: + for clue cells and +WBCs. GC/CHL obtained.  Pt declines surgery at present due to finances.    Assessment:      Vaginal discharge BV Uterine fibroids-18 week size   History of menorrhagia if off OCs  Plan:    Continue OCs  Rx Flagyl 500 mg 1 bid x 7 days  Review handout on [employer] FMLA papers filled out

## 2013-05-16 NOTE — Patient Instructions (Addendum)
Bacterial Vaginosis Bacterial vaginosis (BV) is a vaginal infection where the normal balance of bacteria in the vagina is disrupted. The normal balance is then replaced by an overgrowth of certain bacteria. There are several different kinds of bacteria that can cause BV. BV is the most common vaginal infection in women of childbearing age. CAUSES   The cause of BV is not fully understood. BV develops when there is an increase or imbalance of harmful bacteria.  Some activities or behaviors can upset the normal balance of bacteria in the vagina and put women at increased risk including:  Having a new sex partner or multiple sex partners.  Douching.  Using an intrauterine device (IUD) for contraception.  It is not clear what role sexual activity plays in the development of BV. However, women that have never had sexual intercourse are rarely infected with BV. Women do not get BV from toilet seats, bedding, swimming pools or from touching objects around them.  SYMPTOMS   Grey vaginal discharge.  A fish-like odor with discharge, especially after sexual intercourse.  Itching or burning of the vagina and vulva.  Burning or pain with urination.  Some women have no signs or symptoms at all. DIAGNOSIS  Your caregiver must examine the vagina for signs of BV. Your caregiver will perform lab tests and look at the sample of vaginal fluid through a microscope. They will look for bacteria and abnormal cells (clue cells), a pH test higher than 4.5, and a positive amine test all associated with BV.  RISKS AND COMPLICATIONS   Pelvic inflammatory disease (PID).  Infections following gynecology surgery.  Developing HIV.  Developing herpes virus. TREATMENT  Sometimes BV will clear up without treatment. However, all women with symptoms of BV should be treated to avoid complications, especially if gynecology surgery is planned. Female partners generally do not need to be treated. However, BV may spread  between female sex partners so treatment is helpful in preventing a recurrence of BV.   BV may be treated with antibiotics. The antibiotics come in either pill or vaginal cream forms. Either can be used with nonpregnant or pregnant women, but the recommended dosages differ. These antibiotics are not harmful to the baby.  BV can recur after treatment. If this happens, a second round of antibiotics will often be prescribed.  Treatment is important for pregnant women. If not treated, BV can cause a premature delivery, especially for a pregnant woman who had a premature birth in the past. All pregnant women who have symptoms of BV should be checked and treated.  For chronic reoccurrence of BV, treatment with a type of prescribed gel vaginally twice a week is helpful. HOME CARE INSTRUCTIONS   Finish all medication as directed by your caregiver.  Do not have sex until treatment is completed.  Tell your sexual partner that you have a vaginal infection. They should see their caregiver and be treated if they have problems, such as a mild rash or itching.  Practice safe sex. Use condoms. Only have 1 sex partner. PREVENTION  Basic prevention steps can help reduce the risk of upsetting the natural balance of bacteria in the vagina and developing BV:  Do not have sexual intercourse (be abstinent).  Do not douche.  Use all of the medicine prescribed for treatment of BV, even if the signs and symptoms go away.  Tell your sex partner if you have BV. That way, they can be treated, if needed, to prevent reoccurrence. SEEK MEDICAL CARE IF:     Your symptoms are not improving after 3 days of treatment.  You have increased discharge, pain, or fever. MAKE SURE YOU:   Understand these instructions.  Will watch your condition.  Will get help right away if you are not doing well or get worse. FOR MORE INFORMATION  Division of STD Prevention (DSTDP), Centers for Disease Control and Prevention:  SolutionApps.[employer].za American Social Health Association (ASHA): www.ashastd.org  Document Released: 12/11/2005 Document Revised: 03/04/2012 Document Reviewed: 06/03/2009 [facility] Patient Information 2014 [place], [place]. Take flagyl no sex no alcohol Call for labs or look at my chart

## 2013-05-17 LAB — GC/CHLAMYDIA PROBE AMP
CT Probe RNA: NEGATIVE
GC Probe RNA: NEGATIVE

## 2013-05-20 ENCOUNTER — Telehealth: Payer: Self-pay | Admitting: Adult Health

## 2013-05-20 NOTE — Telephone Encounter (Signed)
Left message test negative 

## 2013-05-21 ENCOUNTER — Telehealth: Payer: Self-pay | Admitting: Adult Health

## 2013-05-21 MED ORDER — NORETHIN-ETH ESTRAD-FE BIPHAS 1 MG-10 MCG / 10 MCG PO TABS: 1.0000 | ORAL_TABLET | Freq: Every day | ORAL | Status: DC

## 2013-05-21 NOTE — Telephone Encounter (Signed)
Left message that lo loestrin would be refilled for her.

## 2013-07-25 ENCOUNTER — Encounter: Payer: Self-pay | Admitting: Adult Health

## 2013-07-25 ENCOUNTER — Ambulatory Visit (INDEPENDENT_AMBULATORY_CARE_PROVIDER_SITE_OTHER): Payer: 59 | Admitting: Adult Health

## 2013-07-25 VITALS — BP 132/88 | Ht 67.0 in | Wt 137.0 lb

## 2013-07-25 DIAGNOSIS — F329 Major depressive disorder, single episode, unspecified: Secondary | ICD-10-CM

## 2013-07-25 DIAGNOSIS — F410 Panic disorder [episodic paroxysmal anxiety] without agoraphobia: Secondary | ICD-10-CM

## 2013-07-25 DIAGNOSIS — F32A Depression, unspecified: Secondary | ICD-10-CM

## 2013-07-25 MED ORDER — CITALOPRAM HYDROBROMIDE 20 MG PO TABS: 20.0000 mg | ORAL_TABLET | Freq: Every day | ORAL | Status: DC

## 2013-07-25 MED ORDER — ALPRAZOLAM 0.5 MG PO TABS: 0.5000 mg | ORAL_TABLET | Freq: Every day | ORAL | Status: DC

## 2013-07-25 NOTE — Patient Instructions (Addendum)
Anxiety and Panic Attacks Your caregiver has informed you that you are having an anxiety or panic attack. There may be many forms of this. Most of the time these attacks come suddenly and without warning. They come at any time of day, including periods of sleep, and at any time of life. They may be strong and unexplained. Although panic attacks are very scary, they are physically harmless. Sometimes the cause of your anxiety is not known. Anxiety is a protective mechanism of the body in its fight or flight mechanism. Most of these perceived danger situations are actually nonphysical situations (such as anxiety over losing a job). CAUSES  The causes of an anxiety or panic attack are many. Panic attacks may occur in otherwise healthy people given a certain set of circumstances. There may be a genetic cause for panic attacks. Some medications may also have anxiety as a side effect. SYMPTOMS  Some of the most common feelings are:  Intense terror.  Dizziness, feeling faint.  Hot and cold flashes.  Fear of going crazy.  Feelings that nothing is real.  Sweating.  Shaking.  Chest pain or a fast heartbeat (palpitations).  Smothering, choking sensations.  Feelings of impending doom and that death is near.  Tingling of extremities, this may be from over-breathing.  Altered reality (derealization).  Being detached from yourself (depersonalization). Several symptoms can be present to make up anxiety or panic attacks. DIAGNOSIS  The evaluation by your caregiver will depend on the type of symptoms you are experiencing. The diagnosis of anxiety or panic attack is made when no physical illness can be determined to be a cause of the symptoms. TREATMENT  Treatment to prevent anxiety and panic attacks may include:  Avoidance of circumstances that cause anxiety.  Reassurance and relaxation.  Regular exercise.  Relaxation therapies, such as yoga.  Psychotherapy with a psychiatrist or  therapist.  Avoidance of caffeine, alcohol and illegal drugs.  Prescribed medication. SEEK IMMEDIATE MEDICAL CARE IF:   You experience panic attack symptoms that are different than your usual symptoms.  You have any worsening or concerning symptoms. Document Released: 12/11/2005 Document Revised: 03/04/2012 Document Reviewed: 04/14/2010 [facility] Patient Information 2014 [place], [place]. Depression, Adult Depression refers to feeling sad, low, down in the dumps, blue, gloomy, or empty. In general, there are two kinds of depression: 1. Depression that we all experience from time to time because of upsetting life experiences, including the loss of a job or the ending of a relationship (normal sadness or normal grief). This kind of depression is considered normal, is short lived, and resolves within a few days to 2 weeks. (Depression experienced after the loss of a loved one is called bereavement. Bereavement often lasts longer than 2 weeks but normally gets better with time.) 2. Clinical depression, which lasts longer than normal sadness or normal grief or interferes with your ability to function at home, at work, and in school. It also interferes with your personal relationships. It affects almost every aspect of your life. Clinical depression is an illness. Symptoms of depression also can be caused by conditions other than normal sadness and grief or clinical depression. Examples of these conditions are listed as follows:  Physical illness Some physical illnesses, including underactive thyroid gland (hypothyroidism), severe anemia, specific types of cancer, diabetes, uncontrolled seizures, heart and lung problems, strokes, and chronic pain are commonly associated with symptoms of depression.  Side effects of some prescription medicine In some people, certain types of prescription medicine can cause symptoms  of depression.  Substance abuse Abuse of alcohol and illicit drugs can cause symptoms of  depression. SYMPTOMS Symptoms of normal sadness and normal grief include the following:  Feeling sad or crying for short periods of time.  Not caring about anything (apathy).  Difficulty sleeping or sleeping too much.  No longer able to enjoy the things you used to enjoy.  Desire to be by oneself all the time (social isolation).  Lack of energy or motivation.  Difficulty concentrating or remembering.  Change in appetite or weight.  Restlessness or agitation. Symptoms of clinical depression include the same symptoms of normal sadness or normal grief and also the following symptoms:  Feeling sad or crying all the time.  Feelings of guilt or worthlessness.  Feelings of hopelessness or helplessness.  Thoughts of suicide or the desire to harm yourself (suicidal ideation).  Loss of touch with reality (psychotic symptoms). Seeing or hearing things that are not real (hallucinations) or having false beliefs about your life or the people around you (delusions and paranoia). DIAGNOSIS  The diagnosis of clinical depression usually is based on the severity and duration of the symptoms. Your caregiver also will ask you questions about your medical history and substance use to find out if physical illness, use of prescription medicine, or substance abuse is causing your depression. Your caregiver also may order blood tests. TREATMENT  Typically, normal sadness and normal grief do not require treatment. However, sometimes antidepressant medicine is prescribed for bereavement to ease the depressive symptoms until they resolve. The treatment for clinical depression depends on the severity of your symptoms but typically includes antidepressant medicine, counseling with a mental health professional, or a combination of both. Your caregiver will help to determine what treatment is best for you. Depression caused by physical illness usually goes away with appropriate medical treatment of the illness.  If prescription medicine is causing depression, talk with your caregiver about stopping the medicine, decreasing the dose, or substituting another medicine. Depression caused by abuse of alcohol or illicit drugs abuse goes away with abstinence from these substances. Some adults need professional help in order to stop drinking or using drugs. SEEK IMMEDIATE CARE IF:  You have thoughts about hurting yourself or others.  You lose touch with reality (have psychotic symptoms).  You are taking medicine for depression and have a serious side effect. FOR MORE INFORMATION National Alliance on Mental Illness: www.nami.[employer]: http://www.maynard.net/ Document Released: 12/08/2000 Document Revised: 06/11/2012 Document Reviewed: 03/11/2012 [facility] Patient Information 2014 [place], [place]. Follow with PCP

## 2013-07-25 NOTE — Progress Notes (Signed)
Subjective:     Patient ID: Vicki [name], female   DOB: [birth year], 47 y.o.   MRN: [id number]  HPI Vicki [name] is a 47 year old black female in complaining of being off celexa x 1 month and her xanax for 2 months.She was taking for depression and panic attacks, and she had gotten them from Dr Vicki [name].She was going to try to do without them but can not.She is not focused at work and not sleeping well, she is not suicidal.She did say her periods were good with the birth control pills.  Review of Systems Positives in HPI Reviewed past medical,surgical, social and family history. Reviewed medications and allergies.     Objective:   Physical Exam BP 132/88  Ht 5\' 7"  (1.702 m)  Wt 137 lb (62.143 kg)  BMI 21.45 kg/m2  LMP 07/23/2013    Talk only, see HPI, will refill celexa and xanax until she can see Dr Vicki [name].  Assessment:     Depression  Panic attacks    Plan:     Refilled celexa 20 mg #30 1 daily with 6 refills and xanax 0.5 mg #30 1 at hs, no refills Follow up with Dr Vicki [name] and she said she would gp make appt now Follow up here prn Review handout on depression and panic attacks

## 2013-11-10 ENCOUNTER — Emergency Department (HOSPITAL_COMMUNITY)
Admission: EM | Admit: 2013-11-10 | Discharge: 2013-11-10 | Disposition: A | Discharge status: Home / Self Care | Payer: 59 | Attending: Emergency Medicine | Admitting: Emergency Medicine

## 2013-11-10 ENCOUNTER — Encounter (HOSPITAL_COMMUNITY): Payer: Self-pay | Admitting: Emergency Medicine

## 2013-11-10 DIAGNOSIS — N920 Excessive and frequent menstruation with regular cycle: Secondary | ICD-10-CM | POA: Insufficient documentation

## 2013-11-10 DIAGNOSIS — Z3202 Encounter for pregnancy test, result negative: Secondary | ICD-10-CM | POA: Insufficient documentation

## 2013-11-10 DIAGNOSIS — Z79899 Other long term (current) drug therapy: Secondary | ICD-10-CM | POA: Insufficient documentation

## 2013-11-10 DIAGNOSIS — D259 Leiomyoma of uterus, unspecified: Secondary | ICD-10-CM | POA: Insufficient documentation

## 2013-11-10 DIAGNOSIS — F3289 Other specified depressive episodes: Secondary | ICD-10-CM | POA: Insufficient documentation

## 2013-11-10 DIAGNOSIS — R109 Unspecified abdominal pain: Secondary | ICD-10-CM

## 2013-11-10 DIAGNOSIS — F329 Major depressive disorder, single episode, unspecified: Secondary | ICD-10-CM | POA: Insufficient documentation

## 2013-11-10 LAB — CBC WITH DIFFERENTIAL/PLATELET
Basophils Absolute: 0 10*3/uL (ref 0.0–0.1)
Basophils Relative: 0 % (ref 0–1)
Eosinophils Absolute: 0.2 10*3/uL (ref 0.0–0.7)
Eosinophils Relative: 3 % (ref 0–5)
HCT: 42.3 % (ref 36.0–46.0)
Hemoglobin: 14.2 g/dL (ref 12.0–15.0)
Lymphocytes Relative: 22 % (ref 12–46)
Lymphs Abs: 1.2 10*3/uL (ref 0.7–4.0)
MCH: 28.7 pg (ref 26.0–34.0)
MCHC: 33.6 g/dL (ref 30.0–36.0)
MCV: 85.6 fL (ref 78.0–100.0)
Monocytes Absolute: 0.4 10*3/uL (ref 0.1–1.0)
Monocytes Relative: 7 % (ref 3–12)
Neutro Abs: 3.6 10*3/uL (ref 1.7–7.7)
Neutrophils Relative %: 68 % (ref 43–77)
Platelets: 221 10*3/uL (ref 150–400)
RBC: 4.94 MIL/uL (ref 3.87–5.11)
RDW: 14.2 % (ref 11.5–15.5)
WBC: 5.4 10*3/uL (ref 4.0–10.5)

## 2013-11-10 LAB — URINALYSIS, ROUTINE W REFLEX MICROSCOPIC
Bilirubin Urine: NEGATIVE
Glucose, UA: NEGATIVE mg/dL
Ketones, ur: NEGATIVE mg/dL
Nitrite: NEGATIVE
Protein, ur: 100 mg/dL — AB
Specific Gravity, Urine: 1.012 (ref 1.005–1.030)
Urobilinogen, UA: 0.2 mg/dL (ref 0.0–1.0)
pH: 5.5 (ref 5.0–8.0)

## 2013-11-10 LAB — COMPREHENSIVE METABOLIC PANEL
ALT: 8 U/L (ref 0–35)
AST: 18 U/L (ref 0–37)
Albumin: 3.6 g/dL (ref 3.5–5.2)
Alkaline Phosphatase: 70 U/L (ref 39–117)
BUN: 5 mg/dL — ABNORMAL LOW (ref 6–23)
CO2: 24 mEq/L (ref 19–32)
Calcium: 9.1 mg/dL (ref 8.4–10.5)
Chloride: 104 mEq/L (ref 96–112)
Creatinine, Ser: 0.86 mg/dL (ref 0.50–1.10)
GFR calc Af Amer: >90 mL/min (ref >90)
GFR calc non Af Amer: 79 mL/min — ABNORMAL LOW (ref >90)
Glucose, Bld: 66 mg/dL — ABNORMAL LOW (ref 70–99)
Potassium: 3.4 mEq/L — ABNORMAL LOW (ref 3.5–5.1)
Sodium: 138 mEq/L (ref 135–145)
Total Bilirubin: 0.2 mg/dL — ABNORMAL LOW (ref 0.3–1.2)
Total Protein: 7.4 g/dL (ref 6.0–8.3)

## 2013-11-10 LAB — POCT PREGNANCY, URINE: Preg Test, Ur: NEGATIVE

## 2013-11-10 LAB — URINE MICROSCOPIC-ADD ON

## 2013-11-10 MED ORDER — KETOROLAC TROMETHAMINE 60 MG/2ML IM SOLN
60.0000 mg | Freq: Once | INTRAMUSCULAR | Status: AC
  Administered 2013-11-10: 60 mg via INTRAMUSCULAR
  Filled 2013-11-10: qty 2

## 2013-11-10 MED ORDER — HYDROCODONE-ACETAMINOPHEN 5-325 MG PO TABS: 1.0000 | ORAL_TABLET | ORAL | Status: DC | PRN

## 2013-11-10 MED ORDER — ONDANSETRON 4 MG PO TBDP: ORAL_TABLET | ORAL | Status: DC

## 2013-11-10 MED ORDER — POTASSIUM CHLORIDE CRYS ER 20 MEQ PO TBCR
40.0000 meq | EXTENDED_RELEASE_TABLET | Freq: Once | ORAL | Status: AC
  Administered 2013-11-10: 40 meq via ORAL
  Filled 2013-11-10: qty 2

## 2013-11-10 MED ORDER — ONDANSETRON 4 MG PO TBDP
4.0000 mg | ORAL_TABLET | Freq: Once | ORAL | Status: AC
  Administered 2013-11-10: 4 mg via ORAL
  Filled 2013-11-10: qty 1

## 2013-11-10 MED ORDER — MORPHINE SULFATE 4 MG/ML IJ SOLN
8.0000 mg | Freq: Once | INTRAMUSCULAR | Status: AC
  Administered 2013-11-10: 8 mg via INTRAMUSCULAR
  Filled 2013-11-10: qty 2

## 2013-11-10 NOTE — ED Provider Notes (Signed)
CSN: [id number]     Arrival date & time 11/10/13  1150 History   First MD Initiated Contact with Patient 11/10/13 1652     Chief Complaint  Patient presents with  . Abdominal Pain   (Consider location/radiation/quality/duration/timing/severity/associated sxs/prior Treatment) HPI Comments: 47 yo female with fibroids, menorrhagia, depression presents with worsening lower central abd pain similar to previous fibroids.  Pt has seen Dr Emelda Fear to discuss surgical and treatment options.  Since last night worsening pain.  Pt on menstrual  Patient is a 47 y.o. female presenting with abdominal pain. The history is provided by the patient.  Abdominal Pain   Past Medical History  Diagnosis Date  . Anemia   . Fibroids     uterine  . Abnormal Pap smear   . BV (bacterial vaginosis) 05/16/2013  . Mental disorder     depression and panic attacks   Past Surgical History  Procedure Laterality Date  . Tubal ligation    . Dilation and curettage of uterus     Family History  Problem Relation Age of Onset  . Congestive Heart Failure Mother   . Congestive Heart Failure Maternal Grandmother   . Cancer Paternal Grandmother     ovarian   History  Substance Use Topics  . Smoking status: Never Smoker   . Smokeless tobacco: Never Used  . Alcohol Use: Yes     Comment: occ   OB History   Grav Para Term Preterm Abortions TAB SAB Ect Mult Living   7 5   2  2   5      Review of Systems  Gastrointestinal: Positive for abdominal pain.    Allergies  Review of patient's allergies indicates no known allergies.  Home Medications   Current Outpatient Rx  Name  Route  Sig  Dispense  Refill  . acetaminophen (TYLENOL) 500 MG tablet   Oral   Take 1,000 mg by mouth every 6 (six) hours as needed.         . ALPRAZolam (XANAX) 0.5 MG tablet   Oral   Take 1 tablet (0.5 mg total) by mouth at bedtime.   30 tablet   0   . citalopram (CELEXA) 20 MG tablet   Oral   Take 1 tablet (20 mg total) by  mouth at bedtime.   30 tablet   6   . Norethindrone-Ethinyl Estradiol-Fe Biphas (LO LOESTRIN FE) 1 MG-10 MCG / 10 MCG tablet   Oral   Take 1 tablet by mouth daily.   1 Package   11    BP 134/83  Pulse 79  Temp(Src) 98.2 F (36.8 C) (Oral)  Resp 18  SpO2 99%  LMP 11/09/2013 Physical Exam  Nursing note and vitals reviewed. Constitutional: She is oriented to person, place, and time. She appears well-developed and well-nourished.  HENT:  Head: Normocephalic and atraumatic.  Eyes: Conjunctivae are normal. Right eye exhibits no discharge. Left eye exhibits no discharge.  Neck: Normal range of motion. Neck supple. No tracheal deviation present.  Cardiovascular: Normal rate and regular rhythm.   Pulmonary/Chest: Effort normal and breath sounds normal.  Abdominal: Soft. She exhibits no distension. There is tenderness (periumbilical). There is no guarding.  Musculoskeletal: She exhibits no edema and no tenderness.  Neurological: She is alert and oriented to person, place, and time.  Skin: Skin is warm. No rash noted.  Psychiatric: She has a normal mood and affect.    ED Course  Procedures (including critical care time) Labs Review Labs  Reviewed  URINALYSIS, ROUTINE W REFLEX MICROSCOPIC - Abnormal; Notable for the following:    Color, Urine RED (*)    APPearance TURBID (*)    Hgb urine dipstick LARGE (*)    Protein, ur 100 (*)    Leukocytes, UA TRACE (*)    All other components within normal limits  COMPREHENSIVE METABOLIC PANEL - Abnormal; Notable for the following:    Potassium 3.4 (*)    Glucose, Bld 66 (*)    BUN 5 (*)    Total Bilirubin 0.2 (*)    GFR calc non Af Amer 79 (*)    All other components within normal limits  URINE MICROSCOPIC-ADD ON - Abnormal; Notable for the following:    Squamous Epithelial / LPF FEW (*)    Bacteria, UA FEW (*)    All other components within normal limits  URINE CULTURE  CBC WITH DIFFERENTIAL  POCT PREGNANCY, URINE   Imaging  Review No results found.  EKG Interpretation   None       MDM   1. Uterine fibroid   2. Abdominal pain    Pt states exactly similar to previous. She understands that pain may be early appy at some point and If fevers or different pain to come in for CT scan.  Pain meds in ED.  Blood work unremarkable.  K mild low, po given.  Hematuria, on menstrual cycle, no dc or fever. No stone hx.   Fup with GYN discussed. Pt improved in ED.  No fever, no wbc elevation, similar to previous.  Results and differential diagnosis were discussed with the patient. Close follow up outpatient was discussed, patient comfortable with the plan.   Diagnosis: abd pain, fibroids, hematuria      Enid Skeens, MD 11/10/13 1806

## 2013-11-10 NOTE — ED Notes (Signed)
Pt discharged home with all belongings, pt alert and ambulatory upon discharge, 2 new RX given, pt verbalizes understanding of discharge instructions, pt driven home by daughter

## 2013-11-10 NOTE — ED Notes (Signed)
Pt is waiting for her daughter to pick her up due to receiving pain medication in ED, her daughter is en route and should be here soon. This RN went over pt's discharge instructions and prescribed medications

## 2013-11-10 NOTE — ED Notes (Signed)
Pt reports lower abd pain and abd distention x3 days. Pt reports uterine fibroids, pt started her menstrual cycle Sunday and the pain has gotten progressively worse over the last 3 days. Pt states she experiences this pain with every menstrual cycle. Pt denies nausea, vomiting, diarrhea, or chest pain.

## 2013-11-10 NOTE — ED Notes (Signed)
Pt has fibroids and menstrual is on and she is having abdominal pain with distention.

## 2013-11-11 LAB — URINE CULTURE
Colony Count: NO GROWTH
Culture: NO GROWTH

## 2014-02-11 ENCOUNTER — Ambulatory Visit (INDEPENDENT_AMBULATORY_CARE_PROVIDER_SITE_OTHER): Payer: 59 | Admitting: Adult Health

## 2014-02-11 ENCOUNTER — Encounter: Payer: Self-pay | Admitting: Adult Health

## 2014-02-11 ENCOUNTER — Encounter (INDEPENDENT_AMBULATORY_CARE_PROVIDER_SITE_OTHER): Payer: Self-pay

## 2014-02-11 VITALS — BP 134/76 | Ht 67.0 in | Wt 142.0 lb

## 2014-02-11 DIAGNOSIS — N898 Other specified noninflammatory disorders of vagina: Secondary | ICD-10-CM

## 2014-02-11 DIAGNOSIS — R1031 Right lower quadrant pain: Secondary | ICD-10-CM

## 2014-02-11 DIAGNOSIS — D219 Benign neoplasm of connective and other soft tissue, unspecified: Secondary | ICD-10-CM

## 2014-02-11 DIAGNOSIS — D259 Leiomyoma of uterus, unspecified: Secondary | ICD-10-CM

## 2014-02-11 DIAGNOSIS — Z029 Encounter for administrative examinations, unspecified: Secondary | ICD-10-CM

## 2014-02-11 DIAGNOSIS — N938 Other specified abnormal uterine and vaginal bleeding: Secondary | ICD-10-CM

## 2014-02-11 DIAGNOSIS — N939 Abnormal uterine and vaginal bleeding, unspecified: Secondary | ICD-10-CM

## 2014-02-11 DIAGNOSIS — N949 Unspecified condition associated with female genital organs and menstrual cycle: Secondary | ICD-10-CM

## 2014-02-11 LAB — POCT HEMOGLOBIN: Hemoglobin: 12.9 g/dL (ref 12.2–16.2)

## 2014-02-11 MED ORDER — HYDROCODONE-ACETAMINOPHEN 5-325 MG PO TABS: 1.0000 | ORAL_TABLET | ORAL | Status: DC | PRN

## 2014-02-11 MED ORDER — MEGESTROL ACETATE 40 MG PO TABS: ORAL_TABLET | ORAL | Status: DC

## 2014-02-11 NOTE — Progress Notes (Signed)
Subjective:     Patient ID: Vicki [name], female   DOB: 01-27-1966, 48 y.o.   MRN: [id number]  HPI Vicki [name] is a 48 year old black female in complaining of bleeding heavy and has pain right side, has been out of work this week. Has history of fibroids and has been on OCs but they are not helping now.  Review of Systems See HPI Reviewed past medical,surgical, social and family history. Reviewed medications and allergies.     Objective:   Physical Exam BP 134/76  Ht 5\' 7"  (1.702 m)  Wt 142 lb (64.411 kg)  BMI 22.24 kg/m2  LMP 02/16/2015HGB 12.9, Skin warm and dry.Pelvic: external genitalia is normal in appearance, vagina: period like blood without odor, cervix:smooth and bulbous, uterus: enlarged, about 20 weeks in size and irregular with fibroids,mildly tender, adnexa: no masses, has RLQ tenderness noted.    Assessment:    Uterine fibroids AUB RLQ pain    Plan:    Use condoms Stop OCs Rx megace 40 mg #45 3 x 5 days then 2 x 5 days then 1 daily with 1 refill   Review handout on fibroids and supracervical hysterectomy Return in 1 week to see Dr Elonda Husky Rx norco 5/325 mg #30 1-2 every 4 hours prn pain, no refills Note given to return to work 02/13/14

## 2014-02-11 NOTE — Patient Instructions (Signed)
Supracervical Hysterectomy A supracervical hysterectomy is surgery to remove the top part of the uterus, but not the cervix. You will no longer have menstrual periods or be able to get pregnant after this surgery. The fallopian tubes and ovaries may also be removed (bilateral salpingo-oophorectomy) during this surgery. This surgery is usually performed using a minimally invasive technique called laparoscopy. This technique allows the surgery to be done through small incisions. The minimally invasive technique provides benefits such as less pain, less risk of infection, and shorter recovery time. LET [facility] CARE PROVIDER KNOW ABOUT: Any allergies you have. All medicines you are taking, including vitamins, herbs, eye drops, creams, and over-the-counter medicines. Previous problems you or members of your family have had with the use of anesthetics. Any blood disorders you have. Previous surgeries you have had. Medical conditions you have. RISKS AND COMPLICATIONS  Generally, this is a safe procedure. However, as with any procedure, complications can occur. Possible complications include: Bleeding. Blood clots in the legs or lung. Infection. Injury to surrounding organs. Problems related to anesthesia. Conversion to an open abdominal surgery. Additional surgery later to remove the cervix if you have problems with the cervix. BEFORE THE PROCEDURE Ask your health care provider about changing or stopping your regular medicines. Do not take aspirin or blood thinners (anticoagulants) for 1 week before the surgery, or as directed by your health care provider. Do not eat or drink anything for 8 hours before the surgery, or as directed by your health care provider. Quit smoking if you smoke. Arrange for a ride home after surgery and for someone to help you at home during recovery. PROCEDURE  You will be given an antibiotic medicine. An IV tube will be placed in one of your veins. You will be given  medicine to make you sleep (general anesthetic). A gas (carbon dioxide) will be used to inflate your abdomen. This will allow your surgeon to look inside your abdomen, perform your surgery, and treat any other problems found if necessary. Three or four small incisions will be made in your abdomen. One of these incisions will be made in the area of your belly button (navel). A thin, flexible tube with a tiny camera and light on the end of it (laparoscope) will be inserted into the incision. The camera on the laparoscope sends a picture to a TV screen in the operating room. This gives your surgeon a good view inside the abdomen. Other surgical instruments will be inserted through the other incisions. The uterus will be cut into small pieces and removed through the small incisions. Your incisions will be closed. AFTER THE PROCEDURE  You will be taken to a recovery area where your progress will be monitored until you are awake, stable, and taking fluids well. If there are no other problems, you will then be moved to a regular hospital room, or you will be allowed to go home. You will likely have minimal discomfort after the surgery because the incisions are so small with the laparoscopic technique. You will be given pain medicine while you are in the hospital and for when you go home. If a bilateral salpingo-oophorectomy was performed before menopause, you will go through a sudden (abrupt) menopause. This can be helped with hormone medicines. Document Released: 05/29/2008 Document Revised: 10/01/2013 Document Reviewed: 06/13/2013 [facility] Patient Information 2014 [place]. Uterine Fibroid A uterine fibroid is a growth (tumor) that occurs in your uterus. This type of tumor is not cancerous and does not spread out  of the uterus. You can have one or many fibroids. Fibroids can vary in size, weight, and where they grow in the uterus. Some can become quite large. Most fibroids do not require medical  treatment, but some can cause pain or heavy bleeding during and between periods. CAUSES  A fibroid is the result of a single uterine cell that keeps growing (unregulated), which is different than most cells in the human body. Most cells have a control mechanism that keeps them from reproducing without control.  SIGNS AND SYMPTOMS   Bleeding.  Pelvic pain and pressure.  Bladder problems due to the size of the fibroid.  Infertility and miscarriages depending on the size and location of the fibroid. DIAGNOSIS  Uterine fibroids are diagnosed through a physical exam. Your health care provider may feel the lumpy tumors during a pelvic exam. Ultrasonography may be done to get information regarding size, location, and number of tumors.  TREATMENT   Your health care provider may recommend watchful waiting. This involves getting the fibroid checked by your health care provider to see if it grows or shrinks.   Hormone treatment or an intrauterine device (IUD) may be prescribed.   Surgery may be needed to remove the fibroids (myomectomy) or the uterus (hysterectomy). This depends on your situation. When fibroids interfere with fertility and a woman wants to become pregnant, a health care provider may recommend having the fibroids removed.  [place] care depends on how you were treated. In general:   Keep all follow-up appointments with your health care provider.   Only take over-the-counter or prescription medicines as directed by your health care provider. If you were prescribed a hormone treatment, take the hormone medicines exactly as directed. Do not take aspirin. It can cause bleeding.   Talk to your health care provider about taking iron pills.  If your periods are troublesome but not so heavy, lie down with your feet raised slightly above your heart. Place cold packs on your lower abdomen.   If your periods are heavy, write down the number of pads or tampons you  use per month. Bring this information to your health care provider.   Include green vegetables in your diet.  SEEK IMMEDIATE MEDICAL CARE IF:  You have pelvic pain or cramps not controlled with medicines.   You have a sudden increase in pelvic pain.   You have an increase in bleeding between and during periods.   You have excessive periods and soak tampons or pads in a half hour or less.  You feel lightheaded or have fainting episodes. Document Released: 12/08/2000 Document Revised: 10/01/2013 Document Reviewed: 07/10/2013 [facility] Patient Information 2014 [place], [place]. Take the megace  Return in 1 week to see Dr Elonda Husky

## 2014-02-18 ENCOUNTER — Encounter: Payer: Self-pay | Admitting: Obstetrics & Gynecology

## 2014-02-18 ENCOUNTER — Ambulatory Visit (INDEPENDENT_AMBULATORY_CARE_PROVIDER_SITE_OTHER): Payer: 59 | Admitting: Obstetrics & Gynecology

## 2014-02-18 VITALS — BP 130/60 | Wt 151.0 lb

## 2014-02-18 DIAGNOSIS — D219 Benign neoplasm of connective and other soft tissue, unspecified: Secondary | ICD-10-CM

## 2014-02-18 DIAGNOSIS — D259 Leiomyoma of uterus, unspecified: Secondary | ICD-10-CM

## 2014-02-18 NOTE — Progress Notes (Signed)
Patient ID: Vicki [name], female   DOB: 1966/08/09, 48 y.o.   MRN: [id number] Pt with 20 week fibroids increasingly painful Will proceed with supracervical abdominal hysterectomy and removal of tubes, pt wants to conserve ovaries despite her proximity to menopause  Scheduled for next Wednesday 02/25/2014

## 2014-02-19 ENCOUNTER — Ambulatory Visit: Payer: 59 | Admitting: Obstetrics & Gynecology

## 2014-02-19 ENCOUNTER — Encounter (HOSPITAL_COMMUNITY): Payer: Self-pay

## 2014-02-23 ENCOUNTER — Encounter (HOSPITAL_COMMUNITY): Payer: Self-pay

## 2014-02-23 ENCOUNTER — Encounter (HOSPITAL_COMMUNITY)
Admission: RE | Admit: 2014-02-23 | Discharge: 2014-02-23 | Disposition: A | Discharge status: Home / Self Care | Payer: 59 | Source: Ambulatory Visit | Attending: Obstetrics & Gynecology | Admitting: Obstetrics & Gynecology

## 2014-02-23 LAB — URINALYSIS, ROUTINE W REFLEX MICROSCOPIC
Bilirubin Urine: NEGATIVE
Glucose, UA: 100 mg/dL — AB
Hgb urine dipstick: NEGATIVE
Ketones, ur: NEGATIVE mg/dL
Leukocytes, UA: NEGATIVE
Nitrite: NEGATIVE
Protein, ur: NEGATIVE mg/dL
Specific Gravity, Urine: 1.02 (ref 1.005–1.030)
Urobilinogen, UA: 0.2 mg/dL (ref 0.0–1.0)
pH: 6 (ref 5.0–8.0)

## 2014-02-23 LAB — CBC
HCT: 38.8 % (ref 36.0–46.0)
Hemoglobin: 12.9 g/dL (ref 12.0–15.0)
MCH: 28.3 pg (ref 26.0–34.0)
MCHC: 33.2 g/dL (ref 30.0–36.0)
MCV: 85.1 fL (ref 78.0–100.0)
Platelets: 346 10*3/uL (ref 150–400)
RBC: 4.56 MIL/uL (ref 3.87–5.11)
RDW: 14 % (ref 11.5–15.5)
WBC: 7.9 10*3/uL (ref 4.0–10.5)

## 2014-02-23 LAB — COMPREHENSIVE METABOLIC PANEL
ALT: 10 U/L (ref 0–35)
AST: 16 U/L (ref 0–37)
Albumin: 3.8 g/dL (ref 3.5–5.2)
Alkaline Phosphatase: 76 U/L (ref 39–117)
BUN: 7 mg/dL (ref 6–23)
CO2: 25 mEq/L (ref 19–32)
Calcium: 9.8 mg/dL (ref 8.4–10.5)
Chloride: 104 mEq/L (ref 96–112)
Creatinine, Ser: 0.88 mg/dL (ref 0.50–1.10)
GFR calc Af Amer: 89 mL/min — ABNORMAL LOW (ref >90)
GFR calc non Af Amer: 76 mL/min — ABNORMAL LOW (ref >90)
Glucose, Bld: 92 mg/dL (ref 70–99)
Potassium: 4.4 mEq/L (ref 3.7–5.3)
Sodium: 141 mEq/L (ref 137–147)
Total Bilirubin: 0.2 mg/dL — ABNORMAL LOW (ref 0.3–1.2)
Total Protein: 8 g/dL (ref 6.0–8.3)

## 2014-02-23 LAB — TYPE AND SCREEN
ABO/RH(D): O POS
Antibody Screen: NEGATIVE

## 2014-02-23 LAB — HCG, QUANTITATIVE, PREGNANCY: hCG, Beta Chain, Quant, S: <1 m[IU]/mL (ref <5)

## 2014-02-23 NOTE — Patient Instructions (Addendum)
Vicki [name]  02/23/2014   Your procedure is scheduled on:  02/25/14  Report to [facility] at 06:15 AM.  Call this number if you have problems the morning of surgery: [phone]   Remember:   Do not eat food or drink liquids after midnight.   Take these medicines the morning of surgery with A SIP OF WATER: Citalopram. Take your Hydrocodone if needed.   Do not wear jewelry, make-up or nail polish.  Do not wear lotions, powders, or perfumes.   Do not shave 48 hours prior to surgery. Men may shave face and neck.  Do not bring valuables to the hospital.  [facility] is not responsible for any belongings or valuables.               Contacts, dentures or bridgework may not be worn into surgery.  Leave suitcase in the car. After surgery it may be brought to your room.  For patients admitted to the hospital, discharge time is determined by your treatment team.               Patients discharged the day of surgery will not be allowed to drive home.   Special Instructions: Shower using CHG 2 nights before surgery and the night before surgery.  If you shower the day of surgery use CHG.  Use special wash - you have one bottle of CHG for all showers.  You should use approximately 1/3 of the bottle for each shower.   Please read over the following fact sheets that you were given: Pain Booklet, Anesthesia Post-op Instructions and Care and Recovery After Surgery    Supracervical Hysterectomy A supracervical hysterectomy is surgery to remove the top part of the uterus, but not the cervix. You will no longer have menstrual periods or be able to get pregnant after this surgery. The fallopian tubes and ovaries may also be removed (bilateral salpingo-oophorectomy) during this surgery. This surgery is usually performed using a minimally invasive technique called laparoscopy. This technique allows the surgery to be done through small incisions. The minimally invasive technique provides benefits such as less  pain, less risk of infection, and shorter recovery time. LET [facility] CARE PROVIDER KNOW ABOUT:  Any allergies you have.  All medicines you are taking, including vitamins, herbs, eye drops, creams, and over-the-counter medicines.  Previous problems you or members of your family have had with the use of anesthetics.  Any blood disorders you have.  Previous surgeries you have had.  Medical conditions you have. RISKS AND COMPLICATIONS  Generally, this is a safe procedure. However, as with any procedure, complications can occur. Possible complications include:  Bleeding.  Blood clots in the legs or lung.  Infection.  Injury to surrounding organs.  Problems related to anesthesia.  Conversion to an open abdominal surgery.  Additional surgery later to remove the cervix if you have problems with the cervix. BEFORE THE PROCEDURE  Ask your health care provider about changing or stopping your regular medicines.  Do not take aspirin or blood thinners (anticoagulants) for 1 week before the surgery, or as directed by your health care provider.  Do not eat or drink anything for 8 hours before the surgery, or as directed by your health care provider.  Quit smoking if you smoke.  Arrange for a ride home after surgery and for someone to help you at home during recovery. PROCEDURE   You will be given an antibiotic medicine.  An IV tube will be placed in  one of your veins. You will be given medicine to make you sleep (general anesthetic).  A gas (carbon dioxide) will be used to inflate your abdomen. This will allow your surgeon to look inside your abdomen, perform your surgery, and treat any other problems found if necessary.  Three or four small incisions will be made in your abdomen. One of these incisions will be made in the area of your belly button (navel). A thin, flexible tube with a tiny camera and light on the end of it (laparoscope) will be inserted into the incision. The  camera on the laparoscope sends a picture to a TV screen in the operating room. This gives your surgeon a good view inside the abdomen.  Other surgical instruments will be inserted through the other incisions.  The uterus will be cut into small pieces and removed through the small incisions.  Your incisions will be closed. AFTER THE PROCEDURE   You will be taken to a recovery area where your progress will be monitored until you are awake, stable, and taking fluids well. If there are no other problems, you will then be moved to a regular hospital room, or you will be allowed to go home.  You will likely have minimal discomfort after the surgery because the incisions are so small with the laparoscopic technique.  You will be given pain medicine while you are in the hospital and for when you go home.  If a bilateral salpingo-oophorectomy was performed before menopause, you will go through a sudden (abrupt) menopause. This can be helped with hormone medicines. Document Released: 05/29/2008 Document Revised: 10/01/2013 Document Reviewed: 06/13/2013 [facility] Patient Information 2014 [place].    Salpingectomy Salpingectomy, also called tubectomy, is the surgical removal of one of the fallopian tubes. The fallopian tubes are tubes that are connected to the uterus. These tubes transport the egg from the ovary to the uterus. A salpingectomy may be done for various reasons, including:   A tubal (ectopic) pregnancy. This is especially true if the tube ruptures.  An infected fallopian tube.  The need to remove the fallopian tube when removing an ovary with a cyst or tumor.  The need to remove the fallopian tube when removing the uterus.  Cancer of the fallopian tube or nearby organs. Removing one fallopian tube does not prevent you from becoming pregnant. It also does not cause problems with your menstrual periods.  LET [facility] CARE PROVIDER KNOW ABOUT:  Any allergies you  have.  All medicines you are taking, including vitamins, herbs, eye drops, creams, and over-the-counter medicines.  Previous problems you or members of your family have had with the use of anesthetics.  Any blood disorders you have.  Previous surgeries you have had.  Medical conditions you have. RISKS AND COMPLICATIONS  Generally, this is a safe procedure. However, as with any procedure, complications can occur. Possible complications include:  Injury to surrounding organs.  Bleeding.  Infection.  Problems related to anesthesia. BEFORE THE PROCEDURE  Ask your health care provider about changing or stopping your regular medicines. You may need to stop taking certain medicines, such as aspirin or blood thinners, at least 1 week before the surgery.  Do not eat or drink anything for at least 8 hours before the surgery.  If you smoke, do not smoke for at least 2 weeks before the surgery.  Make plans to have someone drive you home after the procedure or after your hospital stay. Also arrange for someone to help  you with activities during recovery. PROCEDURE   You will be given medicine to help you relax before the procedure (sedative). You will then be given medicine to make you sleep through the procedure (general anesthetic). These medicines will be given through an IV access tube that is put into one of your veins.  Once you are asleep, your lower abdomen will be shaved and cleaned. A thin, flexible tube (catheter) will be placed in your bladder.  The surgeon may use a laparoscopic, robotic, or open technique for this surgery:  In the laparoscopic technique, the surgery is done through two small cuts (incisions) in the abdomen. A thin, lighted tube with a tiny camera on the end (laparoscope) is inserted into one of the incisions. The tools needed for the procedure are put through the other incision.  A robotic technique may be chosen to perform complex surgery in a small space.  In the robotic technique, small incisions will be made. A camera and surgical instruments are passed through the incisions. Surgical instruments will be controlled with the help of a robotic arm.  In the open technique, the surgery is done through one large incision in the abdomen.  Using any of these techniques, the surgeon removes the fallopian tube from where it attaches to the uterus. The blood vessels will be clamped and tied.  The surgeon then uses staples or stitches to close the incision or incisions. AFTER THE PROCEDURE   You will be taken to a recovery area where your progress will be monitored for 1 3 hours.  If the laparoscopic technique was used, you may be allowed to go home after several hours. You may have some shoulder pain after the laparoscopic procedure. This is normal and usually goes away in a day or two.  If the open technique was used, you will be admitted to the hospital for a couple of days.  You will be given pain medicine if needed.  The IV access tube and catheter will be removed before you are discharged. Document Released: 04/29/2009 Document Revised: 10/01/2013 Document Reviewed: 06/04/2013 [facility] Patient Information 2014 [place], [place].    PATIENT INSTRUCTIONS POST-ANESTHESIA  IMMEDIATELY FOLLOWING SURGERY:  Do not drive or operate machinery for the first twenty four hours after surgery.  Do not make any important decisions for twenty four hours after surgery or while taking narcotic pain medications or sedatives.  If you develop intractable nausea and vomiting or a severe headache please notify your doctor immediately.  FOLLOW-UP:  Please make an appointment with your surgeon as instructed. You do not need to follow up with anesthesia unless specifically instructed to do so.  WOUND CARE INSTRUCTIONS (if applicable):  Keep a dry clean dressing on the anesthesia/puncture wound site if there is drainage.  Once the wound has quit draining you may leave  it open to air.  Generally you should leave the bandage intact for twenty four hours unless there is drainage.  If the epidural site drains for more than 36-48 hours please call the anesthesia department.  QUESTIONS?:  Please feel free to call your physician or the hospital operator if you have any questions, and they will be happy to assist you.

## 2014-02-25 ENCOUNTER — Encounter (HOSPITAL_COMMUNITY)
Admission: RE | Disposition: A | Discharge status: Home / Self Care | Payer: Self-pay | Source: Ambulatory Visit | Attending: Obstetrics & Gynecology

## 2014-02-25 ENCOUNTER — Encounter (HOSPITAL_COMMUNITY): Payer: 59 | Admitting: Anesthesiology

## 2014-02-25 ENCOUNTER — Ambulatory Visit (HOSPITAL_COMMUNITY)
Admission: RE | Admit: 2014-02-25 | Discharge: 2014-02-26 | Disposition: A | Discharge status: Home / Self Care | Payer: 59 | Source: Ambulatory Visit | Attending: Obstetrics & Gynecology | Admitting: Obstetrics & Gynecology

## 2014-02-25 ENCOUNTER — Ambulatory Visit (HOSPITAL_COMMUNITY): Payer: 59 | Admitting: Anesthesiology

## 2014-02-25 ENCOUNTER — Encounter (HOSPITAL_COMMUNITY): Payer: Self-pay | Admitting: *Deleted

## 2014-02-25 DIAGNOSIS — D252 Subserosal leiomyoma of uterus: Secondary | ICD-10-CM

## 2014-02-25 DIAGNOSIS — N920 Excessive and frequent menstruation with regular cycle: Secondary | ICD-10-CM | POA: Insufficient documentation

## 2014-02-25 DIAGNOSIS — N8000 Endometriosis of the uterus, unspecified: Secondary | ICD-10-CM

## 2014-02-25 DIAGNOSIS — Z90710 Acquired absence of both cervix and uterus: Secondary | ICD-10-CM | POA: Diagnosis present

## 2014-02-25 DIAGNOSIS — D259 Leiomyoma of uterus, unspecified: Secondary | ICD-10-CM | POA: Insufficient documentation

## 2014-02-25 DIAGNOSIS — D649 Anemia, unspecified: Secondary | ICD-10-CM | POA: Insufficient documentation

## 2014-02-25 DIAGNOSIS — N840 Polyp of corpus uteri: Secondary | ICD-10-CM

## 2014-02-25 DIAGNOSIS — F3289 Other specified depressive episodes: Secondary | ICD-10-CM | POA: Insufficient documentation

## 2014-02-25 DIAGNOSIS — Z01812 Encounter for preprocedural laboratory examination: Secondary | ICD-10-CM | POA: Insufficient documentation

## 2014-02-25 DIAGNOSIS — F329 Major depressive disorder, single episode, unspecified: Secondary | ICD-10-CM | POA: Insufficient documentation

## 2014-02-25 DIAGNOSIS — F410 Panic disorder [episodic paroxysmal anxiety] without agoraphobia: Secondary | ICD-10-CM | POA: Insufficient documentation

## 2014-02-25 DIAGNOSIS — N838 Other noninflammatory disorders of ovary, fallopian tube and broad ligament: Secondary | ICD-10-CM

## 2014-02-25 HISTORY — PX: BILATERAL SALPINGECTOMY: SHX5743

## 2014-02-25 HISTORY — PX: SUPRACERVICAL ABDOMINAL HYSTERECTOMY: SHX5393

## 2014-02-25 SURGERY — HYSTERECTOMY, SUPRACERVICAL, ABDOMINAL
Anesthesia: General | Site: Abdomen

## 2014-02-25 MED ORDER — ONDANSETRON HCL 4 MG/2ML IJ SOLN
4.0000 mg | Freq: Once | INTRAMUSCULAR | Status: AC
  Administered 2014-02-25: 4 mg via INTRAVENOUS

## 2014-02-25 MED ORDER — ONDANSETRON HCL 4 MG PO TABS
8.0000 mg | ORAL_TABLET | Freq: Four times a day (QID) | ORAL | Status: DC | PRN
Start: 2014-02-25 — End: 2014-02-26

## 2014-02-25 MED ORDER — CITALOPRAM HYDROBROMIDE 20 MG PO TABS
20.0000 mg | ORAL_TABLET | Freq: Every day | ORAL | Status: DC
  Administered 2014-02-25: 20 mg via ORAL
  Filled 2014-02-25: qty 1

## 2014-02-25 MED ORDER — METOPROLOL TARTRATE 1 MG/ML IV SOLN
INTRAVENOUS | Status: DC | PRN
  Administered 2014-02-25: 1 mg via INTRAVENOUS

## 2014-02-25 MED ORDER — PROPOFOL 10 MG/ML IV BOLUS
INTRAVENOUS | Status: DC | PRN
  Administered 2014-02-25: 150 mg via INTRAVENOUS

## 2014-02-25 MED ORDER — KCL IN DEXTROSE-NACL 20-5-0.45 MEQ/L-%-% IV SOLN
INTRAVENOUS | Status: DC
  Administered 2014-02-25 – 2014-02-26 (×4): via INTRAVENOUS

## 2014-02-25 MED ORDER — BUPIVACAINE LIPOSOME 1.3 % IJ SUSP
20.0000 mL | Freq: Once | INTRAMUSCULAR | Status: DC
  Filled 2014-02-25: qty 20

## 2014-02-25 MED ORDER — ROCURONIUM BROMIDE 100 MG/10ML IV SOLN
INTRAVENOUS | Status: DC | PRN
  Administered 2014-02-25: 35 mg via INTRAVENOUS
  Administered 2014-02-25: 10 mg via INTRAVENOUS

## 2014-02-25 MED ORDER — MIDAZOLAM HCL 2 MG/2ML IJ SOLN
INTRAMUSCULAR | Status: AC
  Filled 2014-02-25: qty 2

## 2014-02-25 MED ORDER — SODIUM CHLORIDE 0.9 % IJ SOLN
INTRAMUSCULAR | Status: DC | PRN
  Administered 2014-02-25: 40 mL

## 2014-02-25 MED ORDER — SODIUM CHLORIDE 0.9 % IJ SOLN
INTRAMUSCULAR | Status: AC
  Filled 2014-02-25: qty 40

## 2014-02-25 MED ORDER — MIDAZOLAM HCL 2 MG/2ML IJ SOLN
1.0000 mg | INTRAMUSCULAR | Status: DC | PRN
  Administered 2014-02-25: 2 mg via INTRAVENOUS

## 2014-02-25 MED ORDER — FENTANYL CITRATE 0.05 MG/ML IJ SOLN
INTRAMUSCULAR | Status: AC
  Filled 2014-02-25: qty 2

## 2014-02-25 MED ORDER — DOCUSATE SODIUM 100 MG PO CAPS
100.0000 mg | ORAL_CAPSULE | Freq: Two times a day (BID) | ORAL | Status: DC
  Administered 2014-02-25 – 2014-02-26 (×2): 100 mg via ORAL
  Filled 2014-02-25 (×2): qty 1

## 2014-02-25 MED ORDER — KETOROLAC TROMETHAMINE 30 MG/ML IJ SOLN
30.0000 mg | Freq: Once | INTRAMUSCULAR | Status: AC
  Administered 2014-02-25: 30 mg via INTRAVENOUS
  Filled 2014-02-25: qty 1

## 2014-02-25 MED ORDER — LIDOCAINE HCL (PF) 1 % IJ SOLN
INTRAMUSCULAR | Status: AC
  Filled 2014-02-25: qty 5

## 2014-02-25 MED ORDER — FENTANYL CITRATE 0.05 MG/ML IJ SOLN
INTRAMUSCULAR | Status: AC
  Filled 2014-02-25: qty 5

## 2014-02-25 MED ORDER — PROPOFOL 10 MG/ML IV BOLUS
INTRAVENOUS | Status: AC
  Filled 2014-02-25: qty 20

## 2014-02-25 MED ORDER — NEOSTIGMINE METHYLSULFATE 1 MG/ML IJ SOLN
INTRAMUSCULAR | Status: DC | PRN
  Administered 2014-02-25: 3 mg via INTRAVENOUS

## 2014-02-25 MED ORDER — NEOSTIGMINE METHYLSULFATE 1 MG/ML IJ SOLN
INTRAMUSCULAR | Status: AC
  Filled 2014-02-25: qty 1

## 2014-02-25 MED ORDER — OXYCODONE-ACETAMINOPHEN 5-325 MG PO TABS
1.0000 | ORAL_TABLET | ORAL | Status: DC | PRN
  Administered 2014-02-25: 1 via ORAL
  Administered 2014-02-26 (×2): 2 via ORAL
  Filled 2014-02-25: qty 2
  Filled 2014-02-25: qty 1
  Filled 2014-02-25: qty 2

## 2014-02-25 MED ORDER — KETOROLAC TROMETHAMINE 15 MG/ML IJ SOLN
30.0000 mg | Freq: Once | INTRAMUSCULAR | Status: AC
Start: 2014-02-25 — End: 2014-02-25
  Administered 2014-02-25: 30 mg via INTRAVENOUS
  Filled 2014-02-25: qty 2

## 2014-02-25 MED ORDER — LIDOCAINE HCL 1 % IJ SOLN
INTRAMUSCULAR | Status: DC | PRN
  Administered 2014-02-25: 40 mg via INTRADERMAL

## 2014-02-25 MED ORDER — FENTANYL CITRATE 0.05 MG/ML IJ SOLN
INTRAMUSCULAR | Status: DC | PRN
  Administered 2014-02-25 (×8): 50 ug via INTRAVENOUS

## 2014-02-25 MED ORDER — ROCURONIUM BROMIDE 50 MG/5ML IV SOLN
INTRAVENOUS | Status: AC
  Filled 2014-02-25: qty 1

## 2014-02-25 MED ORDER — GLYCOPYRROLATE 0.2 MG/ML IJ SOLN
INTRAMUSCULAR | Status: AC
  Filled 2014-02-25: qty 3

## 2014-02-25 MED ORDER — CEFAZOLIN SODIUM-DEXTROSE 2-3 GM-% IV SOLR
2.0000 g | INTRAVENOUS | Status: AC
  Administered 2014-02-25: 2 g via INTRAVENOUS
  Filled 2014-02-25: qty 50

## 2014-02-25 MED ORDER — ONDANSETRON HCL 4 MG/2ML IJ SOLN: 4.0000 mg | Freq: Once | INTRAMUSCULAR | Status: DC | PRN

## 2014-02-25 MED ORDER — BUPIVACAINE LIPOSOME 1.3 % IJ SUSP
INTRAMUSCULAR | Status: DC | PRN
  Administered 2014-02-25: 20 mL

## 2014-02-25 MED ORDER — SODIUM CHLORIDE 0.9 % IR SOLN
Status: DC | PRN
  Administered 2014-02-25 (×2): 1000 mL

## 2014-02-25 MED ORDER — ONDANSETRON 8 MG/NS 50 ML IVPB
8.0000 mg | Freq: Four times a day (QID) | INTRAVENOUS | Status: DC | PRN
  Filled 2014-02-25: qty 8

## 2014-02-25 MED ORDER — HYDROMORPHONE HCL PF 1 MG/ML IJ SOLN
1.0000 mg | INTRAMUSCULAR | Status: DC | PRN
  Administered 2014-02-25: 1 mg via INTRAVENOUS
  Filled 2014-02-25: qty 1

## 2014-02-25 MED ORDER — LACTATED RINGERS IV SOLN
INTRAVENOUS | Status: DC
  Administered 2014-02-25: 08:00:00 via INTRAVENOUS
  Administered 2014-02-25: 1000 mL via INTRAVENOUS
  Administered 2014-02-25: 08:00:00 via INTRAVENOUS

## 2014-02-25 MED ORDER — ALPRAZOLAM 0.5 MG PO TABS
0.5000 mg | ORAL_TABLET | Freq: Every day | ORAL | Status: DC
  Administered 2014-02-25: 0.5 mg via ORAL
  Filled 2014-02-25: qty 1

## 2014-02-25 MED ORDER — ONDANSETRON HCL 4 MG/2ML IJ SOLN
INTRAMUSCULAR | Status: AC
  Filled 2014-02-25: qty 2

## 2014-02-25 MED ORDER — ALUM & MAG HYDROXIDE-SIMETH 200-200-20 MG/5ML PO SUSP: 30.0000 mL | ORAL | Status: DC | PRN

## 2014-02-25 MED ORDER — FENTANYL CITRATE 0.05 MG/ML IJ SOLN
25.0000 ug | INTRAMUSCULAR | Status: DC | PRN
  Administered 2014-02-25 (×2): 50 ug via INTRAVENOUS

## 2014-02-25 MED ORDER — GLYCOPYRROLATE 0.2 MG/ML IJ SOLN
INTRAMUSCULAR | Status: DC | PRN
  Administered 2014-02-25: .5 mg via INTRAVENOUS

## 2014-02-25 SURGICAL SUPPLY — 42 items
BAG HAMPER (MISCELLANEOUS) ×3 IMPLANT
BAG URINE DRAINAGE (UROLOGICAL SUPPLIES) ×3 IMPLANT
CATH FOLEY 2WAY SLVR  5CC 14FR (CATHETERS) ×1
CATH FOLEY 2WAY SLVR 5CC 14FR (CATHETERS) ×2 IMPLANT
CLOTH BEACON ORANGE TIMEOUT ST (SAFETY) ×3 IMPLANT
COVER LIGHT HANDLE STERIS (MISCELLANEOUS) ×6 IMPLANT
DERMABOND ADVANCED (GAUZE/BANDAGES/DRESSINGS) ×2
DERMABOND ADVANCED .7 DNX12 (GAUZE/BANDAGES/DRESSINGS) ×4 IMPLANT
DRAPE WARM FLUID 44X44 (DRAPE) ×3 IMPLANT
ELECT REM PT RETURN 9FT ADLT (ELECTROSURGICAL) ×3
ELECTRODE REM PT RTRN 9FT ADLT (ELECTROSURGICAL) ×2 IMPLANT
GLOVE BIOGEL PI IND STRL 6.5 (GLOVE) ×4 IMPLANT
GLOVE BIOGEL PI IND STRL 7.0 (GLOVE) ×4 IMPLANT
GLOVE BIOGEL PI IND STRL 8 (GLOVE) ×4 IMPLANT
GLOVE BIOGEL PI INDICATOR 6.5 (GLOVE) ×2
GLOVE BIOGEL PI INDICATOR 7.0 (GLOVE) ×2
GLOVE BIOGEL PI INDICATOR 8 (GLOVE) ×2
GLOVE ECLIPSE 6.5 STRL STRAW (GLOVE) ×3 IMPLANT
GLOVE ECLIPSE 7.0 STRL STRAW (GLOVE) ×3 IMPLANT
GLOVE ECLIPSE 8.0 STRL XLNG CF (GLOVE) ×3 IMPLANT
GLOVE EXAM NITRILE LRG STRL (GLOVE) ×6 IMPLANT
GOWN STRL REUS W/TWL LRG LVL3 (GOWN DISPOSABLE) ×6 IMPLANT
GOWN STRL REUS W/TWL XL LVL3 (GOWN DISPOSABLE) ×3 IMPLANT
INST SET MAJOR GENERAL (KITS) ×3 IMPLANT
KIT ROOM TURNOVER APOR (KITS) ×3 IMPLANT
MANIFOLD NEPTUNE II (INSTRUMENTS) ×3 IMPLANT
NEEDLE HYPO 18GX1.5 BLUNT FILL (NEEDLE) ×3 IMPLANT
NEEDLE HYPO 21X1 ECLIPSE (NEEDLE) ×3 IMPLANT
NEEDLE HYPO 25X1 1.5 SAFETY (NEEDLE) ×3 IMPLANT
NS IRRIG 1000ML POUR BTL (IV SOLUTION) ×6 IMPLANT
PACK ABDOMINAL MAJOR (CUSTOM PROCEDURE TRAY) ×3 IMPLANT
PAD ARMBOARD 7.5X6 YLW CONV (MISCELLANEOUS) ×3 IMPLANT
SET BASIN LINEN APH (SET/KITS/TRAYS/PACK) ×3 IMPLANT
SUT CHROMIC 0 CT 1 (SUTURE) ×3 IMPLANT
SUT MON AB 3-0 SH 27 (SUTURE) ×3 IMPLANT
SUT VIC AB 0 CT1 27 (SUTURE) ×3
SUT VIC AB 0 CT1 27XCR 8 STRN (SUTURE) ×6 IMPLANT
SUT VIC AB 0 CTX 36 (SUTURE) ×1
SUT VIC AB 0 CTX36XBRD ANTBCTR (SUTURE) ×2 IMPLANT
SUT VICRYL 3 0 (SUTURE) ×3 IMPLANT
SYR 20CC LL (SYRINGE) ×3 IMPLANT
TRAY FOLEY CATH 16FR SILVER (SET/KITS/TRAYS/PACK) ×3 IMPLANT

## 2014-02-25 NOTE — Transfer of Care (Signed)
Immediate Anesthesia Transfer of Care Note  Patient: Vicki [name]  Procedure(s) Performed: Procedure(s): HYSTERECTOMY SUPRACERVICAL ABDOMINAL (N/A) BILATERAL SALPINGECTOMY (Bilateral)  Patient Location: PACU  Anesthesia Type:General  Level of Consciousness: awake  Airway & Oxygen Therapy: Patient Spontanous Breathing and Patient connected to face mask oxygen  Post-op Assessment: Report given to PACU RN  Post vital signs: Reviewed and stable  Complications: No apparent anesthesia complications

## 2014-02-25 NOTE — Anesthesia Procedure Notes (Signed)
Procedure Name: Intubation Date/Time: 02/25/2014 7:44 AM Performed by: Tressie Stalker E Pre-anesthesia Checklist: Patient identified, Patient being monitored, Timeout performed, Emergency Drugs available and Suction available Patient Re-evaluated:Patient Re-evaluated prior to inductionOxygen Delivery Method: Circle System Utilized Preoxygenation: Pre-oxygenation with 100% oxygen Intubation Type: IV induction Ventilation: Mask ventilation without difficulty Laryngoscope Size: Mac and 3 Grade View: Grade I Tube type: Oral Tube size: 7.0 mm Number of attempts: 1 Airway Equipment and Method: stylet Placement Confirmation: ETT inserted through vocal cords under direct vision,  positive ETCO2 and breath sounds checked- equal and bilateral Secured at: 21 cm Tube secured with: Tape Dental Injury: Teeth and Oropharynx as per pre-operative assessment

## 2014-02-25 NOTE — Progress Notes (Signed)
02/25/14 1836 Patient has foley catheter in place post-op. No order for when to d/c foley catheter. Paged MD to clarify when foley to be d/c'd. Awaiting return call. Will notify night shift RN. Donavan Foil, RN

## 2014-02-25 NOTE — Progress Notes (Signed)
Nutrition Brief Note  Patient identified on the Malnutrition Screening Tool (MST) Report  Wt Readings from Last 15 Encounters:  02/25/14 152 lb 8.9 oz (69.2 kg)  02/25/14 152 lb 8.9 oz (69.2 kg)  02/23/14 147 lb (66.679 kg)  02/18/14 151 lb (68.493 kg)  02/11/14 142 lb (64.411 kg)  07/25/13 137 lb (62.143 kg)  05/16/13 131 lb (59.421 kg)  04/18/13 135 lb (61.236 kg)  08/31/12 132 lb (59.875 kg)  04/03/12 115 lb (52.164 kg)   Pt s/p abdominal hysterectomy d/t uterine fibroids. Noted hx of wt gain over the past year.   Body mass index is 23.89 kg/(m^2). Patient meets criteria for normal weight based on current BMI.   Current diet order is clear liquid, patient is consuming approximately n/a% of meals at this time. Labs and medications reviewed.   No nutrition interventions warranted at this time. If nutrition issues arise, please consult RD.   Tiombe Tomeo A. Jimmye Norman, RD, LDN Pager: [phone]

## 2014-02-25 NOTE — Op Note (Signed)
Preoperative diagnosis:  1.  20 week fibroid uterus                                          2.  Increasing pelvic abdominal pain                                         3.  Heavy menstrual bleeding                                         4.  Patient desires to preserve her ovaries  Postoperative diagnosis:  Same as above   Procedure:  Abdominal hysterectomy, supracervical, and prophylactic removal of Fallopian tubes  Surgeon:  Florian Buff  Assistant:    Anesthesia:  General endotracheal  Preoperative clinical summary:  Patient has been known to have enlarged fibroid uterus for quite some time and we have been managing them conservatively.  However her pain has increased significantly as of late, suggesting possibly degenerative changes occuring.  Intraoperative findings: multiple enlarged fibroids, normal ovaries and normal Fallopian tubes; no other intraperitoneal abnormalities  Description of operation:  Patient was taken to the operating room and placed in the supine position where she underwent general endotracheal anesthesia.  She was then prepped and draped in the usual sterile fashion and a Foley catheter was placed for continuous bladder drainage.  A Pfannenstiel skin incision was made and carried down sharply to the rectus fascia which was scored in the midline and extended laterally.  The fascia was taken off the muscles superiorly and inferiorly without difficulty.  The muscles were divided.  The peritoneal cavity was entered.  The uterus was delivered through the incision.  Both uterine cornu were grasped with Coker clamps.  The left round ligament was suture ligated and coagulated with the electrocautery unit.  The left vesicouterine serosal flap was created.  An avascular window in in the peritoneum was created and the utero-ovarian ligament was cross clamped, cut and suture ligated.  The right round ligament was suture ligated and cut with the electrocautery unit.  The  vesicouterine serosal flap on the right was created.  An avascular window in the peritoneum was created and the right utero-ovarian ligament was cross clamped, cut and double suture ligated.  Thus both ovaries were preserved.  The uterine vessels were skeletonized bilaterally.  The uterine vessels were clamped bilaterally,  then cut and suture ligated.  Two more pedicles were taken down the cervix medial to the uterine vessels.  Each pedicle was clamped cut and suture ligated with good resulting hemostasis.  As per the preoperative plan the cervix was then transected sharply and the specimen was removed.  The cervical stump was then closed anterior to posterior for hemostasis and reduce postoperative adhesions.  The Fallopian tubes were removed by cross clamping the mesosalpinx and using a fore and aft suture technique.   The pelvic peritoneum was then closed anterior to posterior, from the right ovary all the way around to the left ovary.   The pelvis was irrigated vigorously and all pedicles were examined and found to be hemostatic.   All specimens were sent to pathology for routine evaluation.  The pelvis was  irrigated vigorously.  All counts were correct at this point x 3.  The muscles and peritoneum were reapproximated loosely.  The fascia was closed with 0 Vicryl running.  Exparel was injected into the subcutaneous tissue for post operative pain management.    The skin was closed using 3-0 Vicryl on a Keith needle in a subcuticular fashion.  Dermabond was then applied for additional wound integrity and to serve as a postoperative bacterial barrier.  The patient was awakened from anesthesia taken to the recovery room in good stable condition. All sponge instrument and needle counts were correct x 3.  The patient received Ancef and Toradol prophylactically preoperatively.  Estimated blood loss for the procedure was 200  cc.  Vicki [name] H 02/25/2014 9:47 AM

## 2014-02-25 NOTE — H&P (Signed)
Preoperative History and Physical  Vicki [name] is a 48 y.o. [id number] with Patient's last menstrual period was 02/09/2014. admitted for a abdominal supracervical and removal of both Fallopian tube, she wants to preserve her ovaries.  Her uterus is 20 weeks size and she wanted to avoid surgery but the pain has gotten too severe.  PMH:    Past Medical History  Diagnosis Date  . Anemia   . Fibroids     uterine  . Abnormal Pap smear   . BV (bacterial vaginosis) 05/16/2013  . Mental disorder     depression and panic attacks    PSH:     Past Surgical History  Procedure Laterality Date  . Tubal ligation    . Dilation and curettage of uterus      POb/GynH:      OB History   Grav Para Term Preterm Abortions TAB SAB Ect Mult Living   7 5   2  2   5       SH:   History  Substance Use Topics  . Smoking status: Never Smoker   . Smokeless tobacco: Never Used  . Alcohol Use: 4.2 oz/week    7 Cans of beer per week     Comment: 1 beer daily     FH:    Family History  Problem Relation Age of Onset  . Congestive Heart Failure Mother   . Congestive Heart Failure Maternal Grandmother   . Cancer Paternal Grandmother     ovarian     Allergies: No Known Allergies  Medications:      Current facility-administered medications:bupivacaine liposome (EXPAREL) 1.3 % injection 266 mg, 20 mL, Infiltration, Once, Florian Buff, MD;  ceFAZolin (ANCEF) IVPB 2 g/50 mL premix, 2 g, Intravenous, On Call to OR, Florian Buff, MD;  lactated ringers infusion, , Intravenous, Continuous, Lerry Liner, MD, Last Rate: 75 mL/hr at 02/25/14 0705, 1,000 mL at 02/25/14 0705 midazolam (VERSED) injection 1-2 mg, 1-2 mg, Intravenous, Q5 Min x 3 PRN, Lerry Liner, MD, 2 mg at 02/25/14 [id number]  Review of Systems:   Review of Systems  Constitutional: Negative for fever, chills, weight loss, malaise/fatigue and diaphoresis.  HENT: Negative for hearing loss, ear pain, nosebleeds, congestion, sore throat, neck  pain, tinnitus and ear discharge.   Eyes: Negative for blurred vision, double vision, photophobia, pain, discharge and redness.  Respiratory: Negative for cough, hemoptysis, sputum production, shortness of breath, wheezing and stridor.   Cardiovascular: Negative for chest pain, palpitations, orthopnea, claudication, leg swelling and PND.  Gastrointestinal: Positive for abdominal pain. Negative for heartburn, nausea, vomiting, diarrhea, constipation, blood in stool and melena.  Genitourinary: Negative for dysuria, urgency, frequency, hematuria and flank pain.  Musculoskeletal: Negative for myalgias, back pain, joint pain and falls.  Skin: Negative for itching and rash.  Neurological: Negative for dizziness, tingling, tremors, sensory change, speech change, focal weakness, seizures, loss of consciousness, weakness and headaches.  Endo/Heme/Allergies: Negative for environmental allergies and polydipsia. Does not bruise/bleed easily.  Psychiatric/Behavioral: Negative for depression, suicidal ideas, hallucinations, memory loss and substance abuse. The patient is not nervous/anxious and does not have insomnia.      PHYSICAL EXAM:  Blood pressure 118/73, pulse 83, temperature 98.5 F (36.9 C), temperature source Oral, resp. rate 19, last menstrual period 02/09/2014, SpO2 99.00%.    Vitals reviewed. Constitutional: She is oriented to person, place, and time. She appears well-developed and well-nourished.  HENT:  Head: Normocephalic and atraumatic.  Right Ear: External ear normal.  Left Ear: External ear normal.  Nose: Nose normal.  Mouth/Throat: Oropharynx is clear and moist.  Eyes: Conjunctivae and EOM are normal. Pupils are equal, round, and reactive to light. Right eye exhibits no discharge. Left eye exhibits no discharge. No scleral icterus.  Neck: Normal range of motion. Neck supple. No tracheal deviation present. No thyromegaly present.  Cardiovascular: Normal rate, regular rhythm, normal  heart sounds and intact distal pulses.  Exam reveals no gallop and no friction rub.   No murmur heard. Respiratory: Effort normal and breath sounds normal. No respiratory distress. She has no wheezes. She has no rales. She exhibits no tenderness.  GI: Soft. Bowel sounds are normal. She exhibits no distension and no mass. There is tenderness. There is no rebound and no guarding.  Genitourinary:       Vulva is normal without lesions Vagina is pink moist without discharge Cervix normal in appearance and pap is normal Uterus is enlarged to 20 weeks size due to multiple fibroids  Adnexa is negative with normal sized ovaries by sonogram  Musculoskeletal: Normal range of motion. She exhibits no edema and no tenderness.  Neurological: She is alert and oriented to person, place, and time. She has normal reflexes. She displays normal reflexes. No cranial nerve deficit. She exhibits normal muscle tone. Coordination normal.  Skin: Skin is warm and dry. No rash noted. No erythema. No pallor.  Psychiatric: She has a normal mood and affect. Her behavior is normal. Judgment and thought content normal.    Labs: Results for orders placed during the hospital encounter of 02/23/14 (from the past 336 hour(s))  CBC   Collection Time    02/23/14  9:00 AM      Result Value Ref Range   WBC 7.9  4.0 - 10.5 K/uL   RBC 4.56  3.87 - 5.11 MIL/uL   Hemoglobin 12.9  12.0 - 15.0 g/dL   HCT 38.8  36.0 - 46.0 %   MCV 85.1  78.0 - 100.0 fL   MCH 28.3  26.0 - 34.0 pg   MCHC 33.2  30.0 - 36.0 g/dL   RDW 14.0  11.5 - 15.5 %   Platelets 346  150 - 400 K/uL  COMPREHENSIVE METABOLIC PANEL   Collection Time    02/23/14  9:00 AM      Result Value Ref Range   Sodium 141  137 - 147 mEq/L   Potassium 4.4  3.7 - 5.3 mEq/L   Chloride 104  96 - 112 mEq/L   CO2 25  19 - 32 mEq/L   Glucose, Bld 92  70 - 99 mg/dL   BUN 7  6 - 23 mg/dL   Creatinine, Ser 0.88  0.50 - 1.10 mg/dL   Calcium 9.8  8.4 - 10.5 mg/dL   Total Protein  8.0  6.0 - 8.3 g/dL   Albumin 3.8  3.5 - 5.2 g/dL   AST 16  0 - 37 U/L   ALT 10  0 - 35 U/L   Alkaline Phosphatase 76  39 - 117 U/L   Total Bilirubin 0.2 (*) 0.3 - 1.2 mg/dL   GFR calc non Af Amer 76 (*) >90 mL/min   GFR calc Af Amer 89 (*) >90 mL/min  HCG, QUANTITATIVE, PREGNANCY   Collection Time    02/23/14  9:00 AM      Result Value Ref Range   hCG, Beta Chain, Quant, S <1  <5 mIU/mL  URINALYSIS, ROUTINE W REFLEX MICROSCOPIC   Collection  Time    02/23/14  9:00 AM      Result Value Ref Range   Color, Urine YELLOW  YELLOW   APPearance CLEAR  CLEAR   Specific Gravity, Urine 1.020  1.005 - 1.030   pH 6.0  5.0 - 8.0   Glucose, UA 100 (*) NEGATIVE mg/dL   Hgb urine dipstick NEGATIVE  NEGATIVE   Bilirubin Urine NEGATIVE  NEGATIVE   Ketones, ur NEGATIVE  NEGATIVE mg/dL   Protein, ur NEGATIVE  NEGATIVE mg/dL   Urobilinogen, UA 0.2  0.0 - 1.0 mg/dL   Nitrite NEGATIVE  NEGATIVE   Leukocytes, UA NEGATIVE  NEGATIVE  TYPE AND SCREEN   Collection Time    02/23/14  9:00 AM      Result Value Ref Range   ABO/RH(D) O POS     Antibody Screen NEG     Sample Expiration 02/26/2014    Results for orders placed in visit on 02/11/14 (from the past 336 hour(s))  POCT HEMOGLOBIN   Collection Time    02/11/14  3:51 PM      Result Value Ref Range   Hemoglobin 12.9  12.2 - 16.2 g/dL    EKG: Orders placed during the hospital encounter of 04/02/12  . EKG 12-LEAD  . EKG 12-LEAD  . EKG    Imaging Studies:      Assessment: 20 week fibroid uterus Abdominal pain   Patient Active Problem List   Diagnosis Date Noted  . Depression 07/25/2013  . Panic attacks 07/25/2013  . Fibroids 05/16/2013  . BV (bacterial vaginosis) 05/16/2013  . Anemia 04/03/2012  . Menorrhagia 04/03/2012  . Hypokalemia 04/03/2012  . UTI (lower urinary tract infection) 04/03/2012  . Emesis 04/03/2012    Plan: Abdominal supracervical hysterectomy and removal of both Fallopian tubes  Vicki [name]  H 02/25/2014 7:20 AM

## 2014-02-25 NOTE — Anesthesia Preprocedure Evaluation (Signed)
Anesthesia Evaluation  Patient identified by MRN, date of birth, ID band Patient awake    Reviewed: Allergy & Precautions, H&P , NPO status , Patient's Chart, lab work & pertinent test results  History of Anesthesia Complications Negative for: history of anesthetic complications  Airway Mallampati: II TM Distance: >3 FB Neck ROM: Full    Dental  (+) Teeth Intact   Pulmonary neg pulmonary ROS,  breath sounds clear to auscultation        Cardiovascular negative cardio ROS  Rhythm:Regular Rate:Normal     Neuro/Psych PSYCHIATRIC DISORDERS Anxiety Depression    GI/Hepatic negative GI ROS,   Endo/Other    Renal/GU      Musculoskeletal   Abdominal   Peds  Hematology   Anesthesia Other Findings   Reproductive/Obstetrics                           Anesthesia Physical Anesthesia Plan  ASA: II  Anesthesia Plan: General   Post-op Pain Management:    Induction: Intravenous  Airway Management Planned: Oral ETT  Additional Equipment:   Intra-op Plan:   Post-operative Plan: Extubation in OR  Informed Consent: I have reviewed the patients History and Physical, chart, labs and discussed the procedure including the risks, benefits and alternatives for the proposed anesthesia with the patient or authorized representative who has indicated his/her understanding and acceptance.     Plan Discussed with:   Anesthesia Plan Comments:         Anesthesia Quick Evaluation

## 2014-02-25 NOTE — Anesthesia Postprocedure Evaluation (Signed)
  Anesthesia Post-op Note  Patient: Vicki [name]  Procedure(s) Performed: Procedure(s): HYSTERECTOMY SUPRACERVICAL ABDOMINAL (N/A) BILATERAL SALPINGECTOMY (Bilateral)  Patient Location: PACU  Anesthesia Type:General  Level of Consciousness: awake, alert  and oriented  Airway and Oxygen Therapy: Patient Spontanous Breathing and Patient connected to face mask oxygen  Post-op Pain: none  Post-op Assessment: Post-op Vital signs reviewed, Patient's Cardiovascular Status Stable, Respiratory Function Stable, Patent Airway and No signs of Nausea or vomiting  Post-op Vital Signs: Reviewed and stable 44.3  Complications: No apparent anesthesia complications

## 2014-02-25 NOTE — Progress Notes (Signed)
02/25/14 1749 patient ambulates in room with nursing staff standby assist. Tolerated jello and broth well this afternoon, stated did not want to advance diet for supper, preferred to have liquids again. Nursing to monitor. Donavan Foil, RN

## 2014-02-26 ENCOUNTER — Encounter (HOSPITAL_COMMUNITY): Payer: Self-pay | Admitting: Obstetrics & Gynecology

## 2014-02-26 LAB — CBC
HCT: 32.9 % — ABNORMAL LOW (ref 36.0–46.0)
Hemoglobin: 10.9 g/dL — ABNORMAL LOW (ref 12.0–15.0)
MCH: 28.1 pg (ref 26.0–34.0)
MCHC: 33.1 g/dL (ref 30.0–36.0)
MCV: 84.8 fL (ref 78.0–100.0)
Platelets: 279 10*3/uL (ref 150–400)
RBC: 3.88 MIL/uL (ref 3.87–5.11)
RDW: 14.1 % (ref 11.5–15.5)
WBC: 13.2 10*3/uL — ABNORMAL HIGH (ref 4.0–10.5)

## 2014-02-26 LAB — BASIC METABOLIC PANEL
BUN: 6 mg/dL (ref 6–23)
CO2: 24 mEq/L (ref 19–32)
Calcium: 8.7 mg/dL (ref 8.4–10.5)
Chloride: 106 mEq/L (ref 96–112)
Creatinine, Ser: 0.83 mg/dL (ref 0.50–1.10)
GFR calc Af Amer: >90 mL/min (ref >90)
GFR calc non Af Amer: 82 mL/min — ABNORMAL LOW (ref >90)
Glucose, Bld: 113 mg/dL — ABNORMAL HIGH (ref 70–99)
Potassium: 4.1 mEq/L (ref 3.7–5.3)
Sodium: 138 mEq/L (ref 137–147)

## 2014-02-26 MED ORDER — OXYCODONE-ACETAMINOPHEN 5-325 MG PO TABS: 1.0000 | ORAL_TABLET | ORAL | Status: DC | PRN

## 2014-02-26 MED ORDER — ONDANSETRON HCL 8 MG PO TABS: 8.0000 mg | ORAL_TABLET | Freq: Four times a day (QID) | ORAL | Status: DC | PRN

## 2014-02-26 MED ORDER — KETOROLAC TROMETHAMINE 10 MG PO TABS: 10.0000 mg | ORAL_TABLET | Freq: Three times a day (TID) | ORAL | Status: DC | PRN

## 2014-02-26 NOTE — Discharge Instructions (Signed)
Hysterectomy °Care After °Refer to this sheet in the next few weeks. These instructions provide you with information on caring for yourself after your procedure. Your caregiver may also give you more specific instructions. Your treatment has been planned according to current medical practices, but problems sometimes occur. Call your caregiver if you have any problems or questions after your procedure. °HOME CARE INSTRUCTIONS  °Healing will take time. You may have discomfort, tenderness, swelling, and bruising at the surgical site for about 2 weeks. This is normal and will get better as time goes on. °· Only take over-the-counter or prescription medicines for pain, discomfort, or fever as directed by your caregiver. °· Do not take aspirin. It can cause bleeding. °· Do not drive when taking pain medicine. °· Follow your caregiver's advice regarding exercise, lifting, driving, and general activities. °· Resume your usual diet as directed and allowed. °· Get plenty of rest and sleep. °· Do not douche, use tampons, or have sexual intercourse for at least 6 weeks or until your caregiver gives you permission. °· Change your bandages (dressings) as directed by your caregiver. °· Monitor your temperature. °· Take showers instead of baths for 2 to 3 weeks. °· Do not drink alcohol until your caregiver gives you permission. °· If you are constipated, you may take a mild laxative with your caregiver's permission. Bran foods may help with constipation problems. Drinking enough fluids to keep your urine clear or pale yellow may help as well. °· Try to have someone home with you for 1 or 2 weeks to help around the house. °· Keep all of your follow-up appointments as directed by your caregiver. °SEEK MEDICAL CARE IF:  °· You have swelling, redness, or increasing pain in the surgical cut (incision) area. °· You have pus coming from the incision. °· You notice a bad smell coming from the incision or dressing. °· You have swelling,  redness, or pain around the intravenous (IV) site. °· Your incision breaks open. °· You feel dizzy or lightheaded. °· You have pain or bleeding when you urinate. °· You have persistent diarrhea. °· You have persistent nausea and vomiting. °· You have abnormal vaginal discharge. °· You have a rash. °· You have any type of abnormal reaction or develop an allergy to your medicine. °· Your pain is not controlled with your prescribed medicine. °SEEK IMMEDIATE MEDICAL CARE IF:  °· You have a fever. °· You have severe abdominal pain. °· You have chest pain. °· You have shortness of breath. °· You faint. °· You have pain, swelling, or redness of your leg. °· You have heavy vaginal bleeding with blood clots. °MAKE SURE YOU: °· Understand these instructions. °· Will watch your condition. °· Will get help right away if you are not doing well or get worse. °Document Released: 06/30/2005 Document Revised: 03/04/2012 Document Reviewed: 07/28/2011 °Exit[employer]® Patient Information ©2014 [employer]. ° °

## 2014-02-26 NOTE — Progress Notes (Signed)
02/26/14 1044 Patient left floor in stable condition via w/c accompanied by nurse tech. Discharged home.  Donavan Foil, RN

## 2014-02-26 NOTE — Anesthesia Postprocedure Evaluation (Signed)
Anesthesia Post Note  Patient: Vicki [name]  Procedure(s) Performed: Procedure(s) (LRB): HYSTERECTOMY SUPRACERVICAL ABDOMINAL (N/A) BILATERAL SALPINGECTOMY (Bilateral)  Anesthesia type: General  Patient location: 336  Post pain: Pain level controlled  Post assessment: Post-op Vital signs reviewed, Patient's Cardiovascular Status Stable, Respiratory Function Stable, Patent Airway, No signs of Nausea or vomiting and Pain level controlled  Last Vitals:  Filed Vitals:   02/26/14 0649  BP: 127/74  Pulse: 89  Temp: 36.9 C  Resp: 19    Post vital signs: Reviewed and stable  Level of consciousness: awake and alert   Complications: No apparent anesthesia complications

## 2014-02-26 NOTE — Addendum Note (Signed)
Addendum created 02/26/14 0853 by Vista Deck, CRNA   Modules edited: Notes Section   Notes Section:  File: [id number]

## 2014-02-26 NOTE — Progress Notes (Signed)
02/26/14 1012 Reviewed discharge instructions with patient. Given copy of AVS, prescription, f/u appointment scheduled. Reviewed post-op education via teachback, pt verbalizes understanding of instructions and when to call MD. IV site d/c'd per nurse tech, within normal limits. Pain reduced with Percocet this morning as ordered. Pt voided independently after foley catheter d/c'd. Ambulates independently with standby assist as needed. Tolerated diet with no complaints. Pt in stable condition awaiting family arrival for discharge home. Donavan Foil, RN

## 2014-02-27 ENCOUNTER — Telehealth: Payer: Self-pay

## 2014-02-27 NOTE — Telephone Encounter (Signed)
Pt asking about FMLA forms, Pt informed form at front desk for pt to pick up.

## 2014-02-27 NOTE — Care Management Note (Signed)
OIB--UR not required

## 2014-03-01 NOTE — Discharge Summary (Signed)
Physician Discharge Summary  Patient ID: Vicki [name] MRN: [id number] DOB/AGE: [birth year] 48 y.o.  Admit date: 02/25/2014 Discharge date: 02/26/2014  Admission Diagnoses: Patient Active Problem List   Diagnosis Date Noted  . S/P abdominal hysterectomy 02/25/2014  . Depression 07/25/2013  . Panic attacks 07/25/2013  . Fibroids 05/16/2013  . BV (bacterial vaginosis) 05/16/2013  . Anemia 04/03/2012  . Menorrhagia 04/03/2012  . Hypokalemia 04/03/2012  . UTI (lower urinary tract infection) 04/03/2012  . Emesis 04/03/2012    Discharge Diagnoses:  Active Problems:   S/P abdominal hysterectomy   Discharged Condition: good  Hospital Course: unremarkable  Consults: None  Significant Diagnostic Studies: labs: no  Treatments: surgery: abdominal hysterectomy, supracervical  Discharge Exam: Blood pressure 127/74, pulse 89, temperature 98.4 F (36.9 C), temperature source Oral, resp. rate 19, height 5\' 7"  (1.702 m), weight 152 lb 8.9 oz (69.2 kg), last menstrual period 02/09/2014, SpO2 98.00%. General appearance: alert, cooperative and no distress GI: soft, non-tender; bowel sounds normal; no masses,  no organomegaly Incision/Wound:clean dry intact  Disposition: 01-Home or Self Care  Discharge Orders   Future Appointments Provider Department Dept Phone   03/05/2014 9:00 AM Florian Buff, MD [facility] [phone]   Future Orders Complete By Expires   Call MD for:  persistant nausea and vomiting  As directed    Call MD for:  severe uncontrolled pain  As directed    Call MD for:  temperature >100.4  As directed    Diet - low sodium heart healthy  As directed    Discharge wound care:  As directed    Comments:     Keep clean and dry   Driving Restrictions  As directed    Comments:     None for 1 week   Increase activity slowly  As directed    Lifting restrictions  As directed    Comments:     Nothing more than 8 pounds   Sexual Activity Restrictions  As directed    Comments:     Are you kidding?       Medication List    STOP taking these medications       acetaminophen 500 MG tablet  Commonly known as:  TYLENOL     HYDROcodone-acetaminophen 5-325 MG per tablet  Commonly known as:  NORCO/VICODIN     megestrol 40 MG tablet  Commonly known as:  MEGACE     norethindrone-ethinyl estradiol 1-20 MG-MCG tablet  Commonly known as:  JUNEL FE,GILDESS FE,LOESTRIN FE      TAKE these medications       ALPRAZolam 0.5 MG tablet  Commonly known as:  XANAX  Take 1 tablet (0.5 mg total) by mouth at bedtime.     citalopram 20 MG tablet  Commonly known as:  CELEXA  Take 1 tablet (20 mg total) by mouth at bedtime.     ketorolac 10 MG tablet  Commonly known as:  TORADOL  Take 1 tablet (10 mg total) by mouth every 8 (eight) hours as needed.     ondansetron 8 MG tablet  Commonly known as:  ZOFRAN  Take 1 tablet (8 mg total) by mouth every 6 (six) hours as needed for nausea.     oxyCODONE-acetaminophen 5-325 MG per tablet  Commonly known as:  PERCOCET/ROXICET  Take 1-2 tablets by mouth every 4 (four) hours as needed for severe pain (moderate to severe pain (when tolerating fluids)).           Follow-up Information  Follow up with Florian Buff, MD In 1 week.   Specialties:  Obstetrics and Gynecology, Radiology   Contact information:   [place] [id number] [phone]       Signed: Florian Buff 03/01/2014, 9:14 PM

## 2014-03-05 ENCOUNTER — Ambulatory Visit (INDEPENDENT_AMBULATORY_CARE_PROVIDER_SITE_OTHER): Payer: Self-pay | Admitting: Obstetrics & Gynecology

## 2014-03-05 ENCOUNTER — Encounter: Payer: Self-pay | Admitting: Obstetrics & Gynecology

## 2014-03-05 VITALS — BP 110/70 | Wt 142.0 lb

## 2014-03-05 DIAGNOSIS — Z9889 Other specified postprocedural states: Secondary | ICD-10-CM

## 2014-03-05 DIAGNOSIS — Z90710 Acquired absence of both cervix and uterus: Secondary | ICD-10-CM

## 2014-03-05 MED ORDER — OXYCODONE-ACETAMINOPHEN 5-325 MG PO TABS: 1.0000 | ORAL_TABLET | ORAL | Status: DC | PRN

## 2014-03-05 NOTE — Progress Notes (Signed)
Patient ID: Vicki [name], female   DOB: 03-15-1966, 47 y.o.   MRN: [id number] POD#8 abdominal hysterectomy supracervical due to fibroids No problems at all   Feeling good normal BM and urinating No bleeding  Incision clean dry intact  Follow up 4 weeks

## 2014-04-02 ENCOUNTER — Encounter: Payer: Self-pay | Admitting: Obstetrics & Gynecology

## 2014-04-02 ENCOUNTER — Encounter: Payer: 59 | Admitting: Obstetrics & Gynecology

## 2014-04-02 ENCOUNTER — Ambulatory Visit (INDEPENDENT_AMBULATORY_CARE_PROVIDER_SITE_OTHER): Payer: 59 | Admitting: Obstetrics & Gynecology

## 2014-04-02 VITALS — BP 120/80 | Wt 144.0 lb

## 2014-04-02 DIAGNOSIS — Z90710 Acquired absence of both cervix and uterus: Secondary | ICD-10-CM

## 2014-04-02 DIAGNOSIS — B9689 Other specified bacterial agents as the cause of diseases classified elsewhere: Secondary | ICD-10-CM

## 2014-04-02 DIAGNOSIS — N760 Acute vaginitis: Secondary | ICD-10-CM

## 2014-04-02 DIAGNOSIS — A499 Bacterial infection, unspecified: Secondary | ICD-10-CM

## 2014-04-02 DIAGNOSIS — Z9889 Other specified postprocedural states: Secondary | ICD-10-CM

## 2014-04-02 MED ORDER — METRONIDAZOLE 0.75 % VA GEL: VAGINAL | Status: DC

## 2014-04-02 NOTE — Progress Notes (Signed)
Patient ID: Vicki [name], female   DOB: [birth year], 48 y.o.   MRN: [id number] Blood pressure 120/80, weight 144 lb (65.318 kg), last menstrual period 02/09/2014. 5 weeks post op TAH, supracervical and removal of Fallopian tubes Pt wanted to preserve her ovaries  Incision clean dry intact +BV on wet prep  Bimanual normal  [place] gel Follow up 1 year  Past Medical History  Diagnosis Date  . Anemia   . Fibroids     uterine  . Abnormal Pap smear   . BV (bacterial vaginosis) 05/16/2013  . Mental disorder     depression and panic attacks    Past Surgical History  Procedure Laterality Date  . Tubal ligation    . Dilation and curettage of uterus    . Supracervical abdominal hysterectomy N/A 02/25/2014    Procedure: HYSTERECTOMY SUPRACERVICAL ABDOMINAL;  Surgeon: Florian Buff, MD;  Location: [facility];  Service: Gynecology;  Laterality: N/A;  . Bilateral salpingectomy Bilateral 02/25/2014    Procedure: BILATERAL SALPINGECTOMY;  Surgeon: Florian Buff, MD;  Location: [facility];  Service: Gynecology;  Laterality: Bilateral;  . Abdominal hysterectomy      OB History   Grav Para Term Preterm Abortions TAB SAB Ect Mult Living   7 5   2  2   5       No Known Allergies  History   Social History  . Marital Status: Legally Separated    Spouse Name: N/A    Number of Children: N/A  . Years of Education: N/A   Social History Main Topics  . Smoking status: Never Smoker   . Smokeless tobacco: Never Used  . Alcohol Use: 4.2 oz/week    7 Cans of beer per week     Comment: 1 beer daily   . Drug Use: No  . Sexual Activity: Not Currently    Birth Control/ Protection: Pill   Other Topics Concern  . None   Social History Narrative  . None    Family History  Problem Relation Age of Onset  . Congestive Heart Failure Mother   . Congestive Heart Failure Maternal Grandmother   . Cancer Paternal Grandmother     ovarian

## 2014-04-03 ENCOUNTER — Telehealth: Payer: Self-pay | Admitting: *Deleted

## 2014-04-03 NOTE — Telephone Encounter (Signed)
Pt stated that she just wanted to make sure what her return to work date was. She thought that Dr. Elonda Husky told her the 17th of this month but her short term disability paper said the 15th. I advised the pt that the nurse that handles the FMLA papers was not here today and that she would be back on Monday. I left a note for Chrystal to call the pt about above.

## 2014-05-08 ENCOUNTER — Encounter (HOSPITAL_COMMUNITY): Payer: Self-pay | Admitting: Emergency Medicine

## 2014-05-08 ENCOUNTER — Emergency Department (HOSPITAL_COMMUNITY)
Admission: EM | Admit: 2014-05-08 | Discharge: 2014-05-08 | Disposition: A | Discharge status: Home / Self Care | Payer: 59 | Attending: Emergency Medicine | Admitting: Emergency Medicine

## 2014-05-08 ENCOUNTER — Emergency Department (HOSPITAL_COMMUNITY): Payer: 59

## 2014-05-08 DIAGNOSIS — L02419 Cutaneous abscess of limb, unspecified: Secondary | ICD-10-CM | POA: Insufficient documentation

## 2014-05-08 DIAGNOSIS — L0390 Cellulitis, unspecified: Secondary | ICD-10-CM

## 2014-05-08 DIAGNOSIS — F329 Major depressive disorder, single episode, unspecified: Secondary | ICD-10-CM | POA: Insufficient documentation

## 2014-05-08 DIAGNOSIS — F410 Panic disorder [episodic paroxysmal anxiety] without agoraphobia: Secondary | ICD-10-CM | POA: Insufficient documentation

## 2014-05-08 DIAGNOSIS — Z791 Long term (current) use of non-steroidal anti-inflammatories (NSAID): Secondary | ICD-10-CM | POA: Insufficient documentation

## 2014-05-08 DIAGNOSIS — Z792 Long term (current) use of antibiotics: Secondary | ICD-10-CM | POA: Insufficient documentation

## 2014-05-08 DIAGNOSIS — Z79899 Other long term (current) drug therapy: Secondary | ICD-10-CM | POA: Insufficient documentation

## 2014-05-08 DIAGNOSIS — Z8742 Personal history of other diseases of the female genital tract: Secondary | ICD-10-CM | POA: Insufficient documentation

## 2014-05-08 DIAGNOSIS — Z8619 Personal history of other infectious and parasitic diseases: Secondary | ICD-10-CM | POA: Insufficient documentation

## 2014-05-08 DIAGNOSIS — Z862 Personal history of diseases of the blood and blood-forming organs and certain disorders involving the immune mechanism: Secondary | ICD-10-CM | POA: Insufficient documentation

## 2014-05-08 DIAGNOSIS — F3289 Other specified depressive episodes: Secondary | ICD-10-CM | POA: Insufficient documentation

## 2014-05-08 DIAGNOSIS — L03119 Cellulitis of unspecified part of limb: Principal | ICD-10-CM

## 2014-05-08 MED ORDER — CEFTRIAXONE SODIUM 1 G IJ SOLR
1.0000 g | Freq: Once | INTRAMUSCULAR | Status: AC
  Administered 2014-05-08: 1 g via INTRAMUSCULAR
  Filled 2014-05-08: qty 10

## 2014-05-08 MED ORDER — LIDOCAINE HCL (PF) 1 % IJ SOLN
INTRAMUSCULAR | Status: AC
  Administered 2014-05-08: 09:00:00
  Filled 2014-05-08: qty 5

## 2014-05-08 MED ORDER — CEPHALEXIN 500 MG PO CAPS: 500.0000 mg | ORAL_CAPSULE | Freq: Four times a day (QID) | ORAL | Status: DC

## 2014-05-08 MED ORDER — HYDROCODONE-ACETAMINOPHEN 5-325 MG PO TABS: 2.0000 | ORAL_TABLET | ORAL | Status: DC | PRN

## 2014-05-08 MED ORDER — HYDROCODONE-ACETAMINOPHEN 5-325 MG PO TABS
1.0000 | ORAL_TABLET | Freq: Once | ORAL | Status: AC
  Administered 2014-05-08: 1 via ORAL
  Filled 2014-05-08: qty 1

## 2014-05-08 NOTE — ED Provider Notes (Signed)
CSN: [id number]     Arrival date & time 05/08/14  0714 History  This chart was scribed for Tanna Furry, MD by Erling Conte, ED Scribe. This patient was seen in room [facility]01/[facility]01 and the patient's care was started at 7:58 AM.    Chief Complaint  Patient presents with  . Ankle Pain     The history is provided by the patient. No language interpreter was used.   HPI Comments: Vicki [name] is a 48 y.o. female who presents to the Emergency Department complaining of mild, non radiating, right ankle pain for 5 days. Patient states that she was walking in her house with flip flops on and it gradually became painful. Patient states that she is having associated redness and swelling of the right ankle. Patient denies any injury to the area. No history of DM. Patient is not allergic to any medications.  Past Medical History  Diagnosis Date  . Anemia   . Fibroids     uterine  . Abnormal Pap smear   . BV (bacterial vaginosis) 05/16/2013  . Mental disorder     depression and panic attacks   Past Surgical History  Procedure Laterality Date  . Tubal ligation    . Dilation and curettage of uterus    . Supracervical abdominal hysterectomy N/A 02/25/2014    Procedure: HYSTERECTOMY SUPRACERVICAL ABDOMINAL;  Surgeon: Florian Buff, MD;  Location: [facility];  Service: Gynecology;  Laterality: N/A;  . Bilateral salpingectomy Bilateral 02/25/2014    Procedure: BILATERAL SALPINGECTOMY;  Surgeon: Florian Buff, MD;  Location: [facility];  Service: Gynecology;  Laterality: Bilateral;  . Abdominal hysterectomy     Family History  Problem Relation Age of Onset  . Congestive Heart Failure Mother   . Congestive Heart Failure Maternal Grandmother   . Cancer Paternal Grandmother     ovarian   History  Substance Use Topics  . Smoking status: Never Smoker   . Smokeless tobacco: Never Used  . Alcohol Use: 4.2 oz/week    7 Cans of beer per week     Comment: 1 beer daily    OB History   Grav Para Term Preterm  Abortions TAB SAB Ect Mult Living   7 5   2  2   5      Review of Systems  Constitutional: Negative for fever, chills, diaphoresis, appetite change and fatigue.  HENT: Negative for mouth sores, sore throat and trouble swallowing.   Eyes: Negative for visual disturbance.  Respiratory: Negative for cough, chest tightness, shortness of breath and wheezing.   Cardiovascular: Negative for chest pain.  Gastrointestinal: Negative for nausea, vomiting, abdominal pain, diarrhea and abdominal distention.  Endocrine: Negative for polydipsia, polyphagia and polyuria.  Genitourinary: Negative for dysuria, frequency and hematuria.  Musculoskeletal: Positive for arthralgias (right ankle pain) and joint swelling (right ankle). Negative for gait problem.  Skin: Positive for color change (redness around right ankle). Negative for pallor and rash.  Neurological: Negative for dizziness, syncope, light-headedness and headaches.  Hematological: Does not bruise/bleed easily.  Psychiatric/Behavioral: Negative for behavioral problems and confusion.  All other systems reviewed and are negative.     Allergies  Review of patient's allergies indicates no known allergies.  Home Medications   Prior to Admission medications   Medication Sig Start Date End Date Taking? Authorizing Provider  ALPRAZolam Duanne Moron) 0.5 MG tablet Take 1 tablet (0.5 mg total) by mouth at bedtime. 07/25/13   Estill Dooms, NP  citalopram (CELEXA) 20 MG tablet  Take 1 tablet (20 mg total) by mouth at bedtime. 07/25/13   Estill Dooms, NP  ketorolac (TORADOL) 10 MG tablet Take 1 tablet (10 mg total) by mouth every 8 (eight) hours as needed. 02/26/14   Florian Buff, MD  metroNIDAZOLE (METROGEL VAGINAL) 0.75 % vaginal gel Nightly x 5 nights 04/02/14   Florian Buff, MD  ondansetron (ZOFRAN) 8 MG tablet Take 1 tablet (8 mg total) by mouth every 6 (six) hours as needed for nausea. 02/26/14   Florian Buff, MD  oxyCODONE-acetaminophen  (PERCOCET/ROXICET) 5-325 MG per tablet Take 1-2 tablets by mouth every 4 (four) hours as needed for severe pain (moderate to severe pain (when tolerating fluids)). 03/05/14   Florian Buff, MD   Triage Vitals: BP 139/77  Pulse 80  Temp(Src) 97.4 F (36.3 C) (Oral)  Ht 5\' 7"  (1.702 m)  Wt 143 lb (64.864 kg)  BMI 22.39 kg/m2  SpO2 99%  LMP 02/09/2014  Physical Exam  Constitutional: She is oriented to person, place, and time. She appears well-developed and well-nourished. No distress.  HENT:  Head: Normocephalic.  Eyes: Conjunctivae are normal. Pupils are equal, round, and reactive to light. No scleral icterus.  Neck: Normal range of motion. Neck supple. No thyromegaly present.  Cardiovascular: Normal rate and regular rhythm.  Exam reveals no gallop and no friction rub.   No murmur heard. Pulmonary/Chest: Effort normal and breath sounds normal. No respiratory distress. She has no wheezes. She has no rales.  Abdominal: Soft. Bowel sounds are normal. She exhibits no distension. There is no tenderness. There is no rebound.  Musculoskeletal: Normal range of motion.  2 cm laceration to the right malleolus 4 cm to the surrounding erythema.  Foot appears normal. Limited range of motion in the ankle secondary to pain No pain, swelling, or cording of proximal lower leg  Neurological: She is alert and oriented to person, place, and time.  Skin: Skin is warm and dry. No rash noted.  Psychiatric: She has a normal mood and affect. Her behavior is normal.    ED Course  Procedures (including critical care time)  DIAGNOSTIC STUDIES: Oxygen Saturation is 99% on RA, normal by my interpretation.    COORDINATION OF CARE: 8:05 AM- Will order diagnostic imaging of right ankle. Will also order Rocephin injection and Vicodin for the pain.  Pt advised of plan for treatment and pt agrees.   Labs Review Labs Reviewed - No data to display  Imaging Review No results found.   EKG Interpretation None       MDM   Final diagnoses:  Cellulitis     I personally performed the services described in this documentation, which was scribed in my presence. The recorded information has been reviewed and is accurate.    Tanna Furry, MD 05/11/14 2107

## 2014-05-08 NOTE — Discharge Instructions (Signed)
Cellulitis °Cellulitis is an infection of the skin and the tissue under the skin. The infected area is usually red and tender. This happens most often in the arms and lower legs. °HOME CARE  °· Take your antibiotic medicine as told. Finish the medicine even if you start to feel better. °· Keep the infected arm or leg raised (elevated). °· Put a warm cloth on the area up to 4 times per day. °· Only take medicines as told by your doctor. °· Keep all doctor visits as told. °GET HELP RIGHT AWAY IF:  °· You have a fever. °· You feel very sleepy. °· You throw up (vomit) or have watery poop (diarrhea). °· You feel sick and have muscle aches and pains. °· You see red streaks on the skin coming from the infected area. °· Your red area gets bigger or turns a dark color. °· Your bone or joint under the infected area is painful after the skin heals. °· Your infection comes back in the same area or different area. °· You have a puffy (swollen) bump in the infected area. °· You have new symptoms. °MAKE SURE YOU:  °· Understand these instructions. °· Will watch your condition. °· Will get help right away if you are not doing well or get worse. °Document Released: 05/29/2008 Document Revised: 06/11/2012 Document Reviewed: 02/26/2012 °[employer]® Patient Information ©2014 [employer]. ° °

## 2014-05-08 NOTE — ED Notes (Signed)
Unknown injury to right ankle x 5 days ago with swelling and redness.

## 2014-08-06 ENCOUNTER — Encounter (HOSPITAL_COMMUNITY): Payer: Self-pay | Admitting: Emergency Medicine

## 2014-08-06 ENCOUNTER — Emergency Department (HOSPITAL_COMMUNITY)
Admission: EM | Admit: 2014-08-06 | Discharge: 2014-08-06 | Disposition: A | Discharge status: Home / Self Care | Payer: 59 | Attending: Emergency Medicine | Admitting: Emergency Medicine

## 2014-08-06 DIAGNOSIS — F410 Panic disorder [episodic paroxysmal anxiety] without agoraphobia: Secondary | ICD-10-CM | POA: Insufficient documentation

## 2014-08-06 DIAGNOSIS — R52 Pain, unspecified: Secondary | ICD-10-CM | POA: Insufficient documentation

## 2014-08-06 DIAGNOSIS — Z8742 Personal history of other diseases of the female genital tract: Secondary | ICD-10-CM | POA: Insufficient documentation

## 2014-08-06 DIAGNOSIS — B9789 Other viral agents as the cause of diseases classified elsewhere: Secondary | ICD-10-CM | POA: Insufficient documentation

## 2014-08-06 DIAGNOSIS — Z862 Personal history of diseases of the blood and blood-forming organs and certain disorders involving the immune mechanism: Secondary | ICD-10-CM | POA: Diagnosis not present

## 2014-08-06 DIAGNOSIS — Z79899 Other long term (current) drug therapy: Secondary | ICD-10-CM | POA: Diagnosis not present

## 2014-08-06 DIAGNOSIS — F3289 Other specified depressive episodes: Secondary | ICD-10-CM | POA: Insufficient documentation

## 2014-08-06 DIAGNOSIS — F329 Major depressive disorder, single episode, unspecified: Secondary | ICD-10-CM | POA: Diagnosis not present

## 2014-08-06 DIAGNOSIS — B349 Viral infection, unspecified: Secondary | ICD-10-CM

## 2014-08-06 LAB — CBC WITH DIFFERENTIAL/PLATELET
Basophils Absolute: 0 10*3/uL (ref 0.0–0.1)
Basophils Relative: 0 % (ref 0–1)
Eosinophils Absolute: 0.1 10*3/uL (ref 0.0–0.7)
Eosinophils Relative: 1 % (ref 0–5)
HCT: 43.1 % (ref 36.0–46.0)
Hemoglobin: 14.5 g/dL (ref 12.0–15.0)
Lymphocytes Relative: 24 % (ref 12–46)
Lymphs Abs: 1.8 10*3/uL (ref 0.7–4.0)
MCH: 28.1 pg (ref 26.0–34.0)
MCHC: 33.6 g/dL (ref 30.0–36.0)
MCV: 83.5 fL (ref 78.0–100.0)
Monocytes Absolute: 0.5 10*3/uL (ref 0.1–1.0)
Monocytes Relative: 7 % (ref 3–12)
Neutro Abs: 5.1 10*3/uL (ref 1.7–7.7)
Neutrophils Relative %: 68 % (ref 43–77)
Platelets: 266 10*3/uL (ref 150–400)
RBC: 5.16 MIL/uL — ABNORMAL HIGH (ref 3.87–5.11)
RDW: 15.4 % (ref 11.5–15.5)
WBC: 7.5 10*3/uL (ref 4.0–10.5)

## 2014-08-06 LAB — URINALYSIS, ROUTINE W REFLEX MICROSCOPIC
Bilirubin Urine: NEGATIVE
Glucose, UA: NEGATIVE mg/dL
Hgb urine dipstick: NEGATIVE
Ketones, ur: NEGATIVE mg/dL
Leukocytes, UA: NEGATIVE
Nitrite: NEGATIVE
Protein, ur: NEGATIVE mg/dL
Specific Gravity, Urine: 1.025 (ref 1.005–1.030)
Urobilinogen, UA: 0.2 mg/dL (ref 0.0–1.0)
pH: 5.5 (ref 5.0–8.0)

## 2014-08-06 LAB — BASIC METABOLIC PANEL
Anion gap: 13 (ref 5–15)
BUN: 12 mg/dL (ref 6–23)
CO2: 24 mEq/L (ref 19–32)
Calcium: 9.2 mg/dL (ref 8.4–10.5)
Chloride: 100 mEq/L (ref 96–112)
Creatinine, Ser: 0.9 mg/dL (ref 0.50–1.10)
GFR calc Af Amer: 86 mL/min — ABNORMAL LOW (ref >90)
GFR calc non Af Amer: 74 mL/min — ABNORMAL LOW (ref >90)
Glucose, Bld: 85 mg/dL (ref 70–99)
Potassium: 3.7 mEq/L (ref 3.7–5.3)
Sodium: 137 mEq/L (ref 137–147)

## 2014-08-06 MED ORDER — ONDANSETRON HCL 4 MG PO TABS: 4.0000 mg | ORAL_TABLET | Freq: Four times a day (QID) | ORAL | Status: DC

## 2014-08-06 MED ORDER — SODIUM CHLORIDE 0.9 % IV SOLN
1000.0000 mL | Freq: Once | INTRAVENOUS | Status: AC
  Administered 2014-08-06: 1000 mL via INTRAVENOUS

## 2014-08-06 MED ORDER — SODIUM CHLORIDE 0.9 % IV SOLN: 1000.0000 mL | INTRAVENOUS | Status: DC

## 2014-08-06 MED ORDER — ONDANSETRON HCL 4 MG/2ML IJ SOLN
4.0000 mg | Freq: Once | INTRAMUSCULAR | Status: AC
  Administered 2014-08-06: 4 mg via INTRAVENOUS
  Filled 2014-08-06: qty 2

## 2014-08-06 NOTE — ED Notes (Signed)
Pt reports nonproductive cough, n/v/d since yesterday.  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

## 2014-08-06 NOTE — ED Provider Notes (Signed)
CSN: [id number]     Arrival date & time 08/06/14  0805 History   First MD Initiated Contact with Patient 08/06/14 [phone]     Chief Complaint  Patient presents with  . Generalized Body Aches     (Consider location/radiation/quality/duration/timing/severity/associated sxs/prior Treatment) HPI Comments: Patient is a 48 year old female who presents to the emergency department with a complaint of nausea, vomiting, diarrhea, and generally not feeling well. The patient states that she works as a [employer]. She was working with a client a few days ago who had a bad cough and congestion. The patient thinks that she may have contracted an illness from this patient. The patient describes nausea that started on yesterday, 2-3 episodes of vomiting, 3 episodes of diarrhea on yesterday. She had one episode of diarrhea again this morning. She denies any blood in the vomitus, the bladder or the diarrhea. She denies having been out of the country recently. She has not measured any temperature elevations, but complains of what felt like chills accompanied by body aches. The patient has taken Tylenol, but states that she has not gotten any significant relief from her symptoms. She presents now for assistance with these problems.  The history is provided by the patient.    Past Medical History  Diagnosis Date  . Anemia   . Fibroids     uterine  . Abnormal Pap smear   . BV (bacterial vaginosis) 05/16/2013  . Mental disorder     depression and panic attacks   Past Surgical History  Procedure Laterality Date  . Tubal ligation    . Dilation and curettage of uterus    . Supracervical abdominal hysterectomy N/A 02/25/2014    Procedure: HYSTERECTOMY SUPRACERVICAL ABDOMINAL;  Surgeon: Florian Buff, MD;  Location: [facility];  Service: Gynecology;  Laterality: N/A;  . Bilateral salpingectomy Bilateral 02/25/2014    Procedure: BILATERAL SALPINGECTOMY;  Surgeon: Florian Buff, MD;  Location: [facility];  Service:  Gynecology;  Laterality: Bilateral;  . Abdominal hysterectomy     Family History  Problem Relation Age of Onset  . Congestive Heart Failure Mother   . Congestive Heart Failure Maternal Grandmother   . Cancer Paternal Grandmother     ovarian   History  Substance Use Topics  . Smoking status: Never Smoker   . Smokeless tobacco: Never Used  . Alcohol Use: 4.2 oz/week    7 Cans of beer per week     Comment: 1 beer daily    OB History   Grav Para Term Preterm Abortions TAB SAB Ect Mult Living   7 5   2  2   5      Review of Systems  Constitutional: Negative for activity change.       All ROS Neg except as noted in HPI  HENT: Negative for nosebleeds.   Eyes: Negative for photophobia and discharge.  Respiratory: Positive for cough. Negative for shortness of breath and wheezing.   Cardiovascular: Negative for chest pain and palpitations.  Gastrointestinal: Positive for nausea, vomiting and diarrhea. Negative for abdominal pain and blood in stool.  Genitourinary: Negative for dysuria, frequency and hematuria.  Musculoskeletal: Positive for myalgias. Negative for arthralgias, back pain and neck pain.  Skin: Negative.   Neurological: Negative for dizziness, seizures and speech difficulty.  Psychiatric/Behavioral: Negative for hallucinations and confusion.      Allergies  Review of patient's allergies indicates no known allergies.  Home Medications   Prior to Admission medications   Medication Sig Start  Date End Date Taking? Authorizing Provider  ALPRAZolam Duanne Moron) 0.5 MG tablet Take 1 tablet (0.5 mg total) by mouth at bedtime. 07/25/13  Yes Estill Dooms, NP  citalopram (CELEXA) 20 MG tablet Take 1 tablet (20 mg total) by mouth at bedtime. 07/25/13  Yes Estill Dooms, NP   BP 156/85  Pulse 69  Temp(Src) 98.3 F (36.8 C) (Oral)  Resp 18  Ht 5\' 7"  (1.702 m)  Wt 139 lb (63.05 kg)  BMI 21.77 kg/m2  SpO2 100%  LMP 02/09/2014 Physical Exam  Nursing note and vitals  reviewed. Constitutional: She is oriented to person, place, and time. She appears well-developed and well-nourished.  Non-toxic appearance.  HENT:  Head: Normocephalic.  Right Ear: Tympanic membrane and external ear normal.  Left Ear: Tympanic membrane and external ear normal.  Eyes: EOM and lids are normal. Pupils are equal, round, and reactive to light.  Neck: Normal range of motion. Neck supple. Carotid bruit is not present.  Cardiovascular: Normal rate, regular rhythm, normal heart sounds, intact distal pulses and normal pulses.   Pulmonary/Chest: Breath sounds normal. No respiratory distress.  Abdominal: Soft. Bowel sounds are normal. There is no tenderness. There is no guarding.  Musculoskeletal: Normal range of motion.  Lymphadenopathy:       Head (right side): No submandibular adenopathy present.       Head (left side): No submandibular adenopathy present.    She has no cervical adenopathy.  Neurological: She is alert and oriented to person, place, and time. She has normal strength. No cranial nerve deficit or sensory deficit.  Skin: Skin is warm and dry.  Psychiatric: She has a normal mood and affect. Her speech is normal.    ED Course  Procedures (including critical care time) Labs Review Labs Reviewed  URINALYSIS, ROUTINE W REFLEX MICROSCOPIC  CBC WITH DIFFERENTIAL  BASIC METABOLIC PANEL    Imaging Review No results found.   EKG Interpretation None      MDM Vital signs are well within normal limits. Pulse oximetry 100% on room air. Within normal limits by my interpretation. Urinalysis is within normal limits.    Recheck. Patient laughing and talking with visit her. In no acute distress. Tolerating fluids without problem. Nausea much improved per the patient.  Complete blood count within normal limits. Basic metabolic panel within normal limits with exception of the glomerular filtration rate being slightly low at 86. Patient advised of lab results. Patient  encouraged to increase fluids, use a mask until symptoms have resolved. Patient is to return if any changes, problems, or concerns.    Final diagnoses:  None    *I have reviewed nursing notes, vital signs, and all appropriate lab and imaging results for this patient.Lenox Ahr, PA-C 08/06/14 1016

## 2014-08-06 NOTE — ED Notes (Signed)
Pt reports n/v/d,non-productive cough since yesterday. Pt reports generalized body aches. Airway patent. Pt denies any known fevers.

## 2014-08-06 NOTE — ED Provider Notes (Signed)
Medical screening examination/treatment/procedure(s) were conducted as a shared visit with non-physician practitioner(s) and myself.  I personally evaluated the patient during the encounter.   EKG Interpretation None     Patient is nontoxic. No acute distress. Normal labs  Nat Christen, MD 08/06/14 1527

## 2014-08-06 NOTE — Discharge Instructions (Signed)
Your vital signs are within normal limits. Your urine analysis is within normal limits. Your blood work does not reveal any acute changes. Suspect that you have a viral illness. Please wash hands frequently. Please use a mask until symptoms have resolved. Please increase fluids. Use Tylenol every 4 hours, or ibuprofen every 6 hours for body aches or any temperature elevations. Please use Zofran for nausea if needed. Please see Dr. Orson Ape or return to the emergency department if any changes, problems, or concerns.

## 2014-09-29 ENCOUNTER — Emergency Department (HOSPITAL_COMMUNITY)
Admission: EM | Admit: 2014-09-29 | Discharge: 2014-09-29 | Disposition: A | Discharge status: Home / Self Care | Payer: 59 | Attending: Emergency Medicine | Admitting: Emergency Medicine

## 2014-09-29 ENCOUNTER — Encounter (HOSPITAL_COMMUNITY): Payer: Self-pay | Admitting: Emergency Medicine

## 2014-09-29 DIAGNOSIS — R109 Unspecified abdominal pain: Secondary | ICD-10-CM | POA: Insufficient documentation

## 2014-09-29 DIAGNOSIS — Z9851 Tubal ligation status: Secondary | ICD-10-CM | POA: Insufficient documentation

## 2014-09-29 DIAGNOSIS — N939 Abnormal uterine and vaginal bleeding, unspecified: Secondary | ICD-10-CM

## 2014-09-29 DIAGNOSIS — Z9889 Other specified postprocedural states: Secondary | ICD-10-CM | POA: Insufficient documentation

## 2014-09-29 DIAGNOSIS — F410 Panic disorder [episodic paroxysmal anxiety] without agoraphobia: Secondary | ICD-10-CM | POA: Insufficient documentation

## 2014-09-29 DIAGNOSIS — F329 Major depressive disorder, single episode, unspecified: Secondary | ICD-10-CM | POA: Insufficient documentation

## 2014-09-29 DIAGNOSIS — Z90710 Acquired absence of both cervix and uterus: Secondary | ICD-10-CM | POA: Insufficient documentation

## 2014-09-29 DIAGNOSIS — Z8619 Personal history of other infectious and parasitic diseases: Secondary | ICD-10-CM | POA: Insufficient documentation

## 2014-09-29 DIAGNOSIS — Z862 Personal history of diseases of the blood and blood-forming organs and certain disorders involving the immune mechanism: Secondary | ICD-10-CM | POA: Insufficient documentation

## 2014-09-29 HISTORY — DX: Anxiety disorder, unspecified: F41.9

## 2014-09-29 LAB — CBC WITH DIFFERENTIAL/PLATELET
Basophils Absolute: 0 10*3/uL (ref 0.0–0.1)
Basophils Relative: 1 % (ref 0–1)
Eosinophils Absolute: 0.1 10*3/uL (ref 0.0–0.7)
Eosinophils Relative: 1 % (ref 0–5)
HCT: 42 % (ref 36.0–46.0)
Hemoglobin: 14.4 g/dL (ref 12.0–15.0)
Lymphocytes Relative: 23 % (ref 12–46)
Lymphs Abs: 1.2 10*3/uL (ref 0.7–4.0)
MCH: 29.1 pg (ref 26.0–34.0)
MCHC: 34.3 g/dL (ref 30.0–36.0)
MCV: 85 fL (ref 78.0–100.0)
Monocytes Absolute: 0.3 10*3/uL (ref 0.1–1.0)
Monocytes Relative: 6 % (ref 3–12)
Neutro Abs: 3.7 10*3/uL (ref 1.7–7.7)
Neutrophils Relative %: 69 % (ref 43–77)
Platelets: 280 10*3/uL (ref 150–400)
RBC: 4.94 MIL/uL (ref 3.87–5.11)
RDW: 14.5 % (ref 11.5–15.5)
WBC: 5.3 10*3/uL (ref 4.0–10.5)

## 2014-09-29 LAB — WET PREP, GENITAL
Trich, Wet Prep: NONE SEEN
Yeast Wet Prep HPF POC: NONE SEEN

## 2014-09-29 LAB — BASIC METABOLIC PANEL
Anion gap: 11 (ref 5–15)
BUN: 13 mg/dL (ref 6–23)
CO2: 26 mEq/L (ref 19–32)
Calcium: 9.2 mg/dL (ref 8.4–10.5)
Chloride: 101 mEq/L (ref 96–112)
Creatinine, Ser: 0.9 mg/dL (ref 0.50–1.10)
GFR calc Af Amer: 86 mL/min — ABNORMAL LOW (ref >90)
GFR calc non Af Amer: 74 mL/min — ABNORMAL LOW (ref >90)
Glucose, Bld: 89 mg/dL (ref 70–99)
Potassium: 3.9 mEq/L (ref 3.7–5.3)
Sodium: 138 mEq/L (ref 137–147)

## 2014-09-29 LAB — URINALYSIS, ROUTINE W REFLEX MICROSCOPIC
Bilirubin Urine: NEGATIVE
Glucose, UA: NEGATIVE mg/dL
Ketones, ur: NEGATIVE mg/dL
Leukocytes, UA: NEGATIVE
Nitrite: NEGATIVE
Protein, ur: NEGATIVE mg/dL
Specific Gravity, Urine: 1.02 (ref 1.005–1.030)
Urobilinogen, UA: 1 mg/dL (ref 0.0–1.0)
pH: 6 (ref 5.0–8.0)

## 2014-09-29 LAB — URINE MICROSCOPIC-ADD ON

## 2014-09-29 MED ORDER — METRONIDAZOLE 500 MG PO TABS: 500.0000 mg | ORAL_TABLET | Freq: Two times a day (BID) | ORAL | Status: DC

## 2014-09-29 NOTE — Discharge Instructions (Signed)

## 2014-09-29 NOTE — ED Notes (Signed)
Pt states partial hysterectomy was in March and she has recently began spotting, bright red in color. Also states lower abdominal pain.

## 2014-09-29 NOTE — ED Provider Notes (Signed)
CSN: [id number]     Arrival date & time 09/29/14  0759 History   First MD Initiated Contact with Patient 09/29/14 [phone]     Chief Complaint  Patient presents with  . Vaginal Bleeding     (Consider location/radiation/quality/duration/timing/severity/associated sxs/prior Treatment) HPI   GLENDELL SCHLOTTMAN is a 48 y.o. female who is s/p partial hysterectomy in March 2015 presents to the Emergency Department complaining of vaginal bleeding that began yesterday.  She describes the bleeding as slight and intermittent and much less than a typical period.  She also c/o lower abdominal cramping since yesterday.  She denies vomiting, fever, dysuria, vaginal discharge or recent trauma.     Past Medical History  Diagnosis Date  . Anemia   . Fibroids     uterine  . Abnormal Pap smear   . BV (bacterial vaginosis) 05/16/2013  . Mental disorder     depression and panic attacks  . Anxiety    Past Surgical History  Procedure Laterality Date  . Tubal ligation    . Dilation and curettage of uterus    . Supracervical abdominal hysterectomy N/A 02/25/2014    Procedure: HYSTERECTOMY SUPRACERVICAL ABDOMINAL;  Surgeon: Florian Buff, MD;  Location: [facility];  Service: Gynecology;  Laterality: N/A;  . Bilateral salpingectomy Bilateral 02/25/2014    Procedure: BILATERAL SALPINGECTOMY;  Surgeon: Florian Buff, MD;  Location: [facility];  Service: Gynecology;  Laterality: Bilateral;  . Abdominal hysterectomy     Family History  Problem Relation Age of Onset  . Congestive Heart Failure Mother   . Congestive Heart Failure Maternal Grandmother   . Cancer Paternal Grandmother     ovarian   History  Substance Use Topics  . Smoking status: Never Smoker   . Smokeless tobacco: Never Used  . Alcohol Use: 4.2 oz/week    7 Cans of beer per week     Comment: 1 beer daily    OB History   Grav Para Term Preterm Abortions TAB SAB Ect Mult Living   7 5   2  2   5      Review of Systems  Constitutional: Negative for  fever, activity change and appetite change.  Respiratory: Negative for chest tightness and shortness of breath.   Cardiovascular: Negative for chest pain.  Gastrointestinal: Positive for abdominal pain. Negative for nausea and vomiting.  Genitourinary: Positive for vaginal bleeding. Negative for dysuria, hematuria, flank pain and vaginal discharge.  Musculoskeletal: Negative for back pain and joint swelling.  Skin: Negative for rash.  Neurological: Negative for syncope, speech difficulty, weakness and headaches.  Psychiatric/Behavioral: The patient is not nervous/anxious.   All other systems reviewed and are negative.     Allergies  Review of patient's allergies indicates no known allergies.  Home Medications   Prior to Admission medications   Medication Sig Start Date End Date Taking? Authorizing Provider  ALPRAZolam Duanne Moron) 0.5 MG tablet Take 1 tablet (0.5 mg total) by mouth at bedtime. 07/25/13   Estill Dooms, NP  citalopram (CELEXA) 20 MG tablet Take 1 tablet (20 mg total) by mouth at bedtime. 07/25/13   Estill Dooms, NP  ondansetron (ZOFRAN) 4 MG tablet Take 1 tablet (4 mg total) by mouth every 6 (six) hours. 08/06/14   Lenox Ahr, PA-C   BP 155/91  Pulse 75  Temp(Src) 97.7 F (36.5 C) (Oral)  Resp 16  Ht 5\' 7"  (1.702 m)  Wt 145 lb (65.772 kg)  BMI 22.71 kg/m2  SpO2 100%  LMP 02/09/2014 Physical Exam  Nursing note and vitals reviewed. Constitutional: She is oriented to person, place, and time. She appears well-developed and well-nourished. No distress.  HENT:  Head: Normocephalic and atraumatic.  Neck: Normal range of motion. Neck supple.  Cardiovascular: Normal rate, regular rhythm, normal heart sounds and intact distal pulses.   No murmur heard. Pulmonary/Chest: Effort normal and breath sounds normal. No respiratory distress.  Abdominal: Soft. She exhibits no distension and no mass. There is no tenderness. There is no rebound and no guarding.   Genitourinary: There is no rash or tenderness on the right labia. There is no rash or tenderness on the left labia. Right adnexum displays no mass and no tenderness. Left adnexum displays no mass and no tenderness. There is bleeding around the vagina. No tenderness around the vagina. No foreign body around the vagina. No signs of injury around the vagina.  Uterus is surgically absent.  Slight dark appearing blood in the vaginal vault.  Musculoskeletal: Normal range of motion.  Lymphadenopathy:    She has no cervical adenopathy.  Neurological: She is alert and oriented to person, place, and time. She exhibits normal muscle tone. Coordination normal.  Skin: Skin is warm and dry. No rash noted.    ED Course  Procedures (including critical care time) Labs Review Labs Reviewed  WET PREP, GENITAL - Abnormal; Notable for the following:    Clue Cells Wet Prep HPF POC MANY (*)    WBC, Wet Prep HPF POC MANY (*)    All other components within normal limits  URINALYSIS, ROUTINE W REFLEX MICROSCOPIC - Abnormal; Notable for the following:    Hgb urine dipstick TRACE (*)    All other components within normal limits  BASIC METABOLIC PANEL - Abnormal; Notable for the following:    GFR calc non Af Amer 74 (*)    GFR calc Af Amer 86 (*)    All other components within normal limits  URINE MICROSCOPIC-ADD ON - Abnormal; Notable for the following:    Squamous Epithelial / LPF MANY (*)    Bacteria, UA MANY (*)    All other components within normal limits  GC/CHLAMYDIA PROBE AMP  CBC WITH DIFFERENTIAL    Imaging Review No results found.   EKG Interpretation None      MDM   Final diagnoses:  Vaginal bleeding, abnormal    Pt is well appearing, non-toxic.  Abdomen is soft, no concerning sx's for PID or acute abdomen.  Consulted Dr. Glo Herring.  Will see pt in his office this week.  Pt has appt on Thursday at 11:30 am.  Pt agrees to plan and appears stable for discharge.       Seferina Brokaw L.  Vanessa [place], PA-C 10/01/14 1635

## 2014-09-30 LAB — GC/CHLAMYDIA PROBE AMP
CT Probe RNA: NEGATIVE
GC Probe RNA: NEGATIVE

## 2014-10-01 ENCOUNTER — Encounter: Payer: Self-pay | Admitting: *Deleted

## 2014-10-01 ENCOUNTER — Ambulatory Visit: Payer: 59 | Admitting: Obstetrics & Gynecology

## 2014-10-03 NOTE — ED Provider Notes (Signed)
Medical screening examination/treatment/procedure(s) were conducted as a shared visit with non-physician practitioner(s) and myself.  I personally evaluated the patient during the encounter.   EKG Interpretation None     Status post partial hysterectomy in March 2015 with vaginal bleeding. No acute abdomen. Will refer to gynecology  Nat Christen, MD 10/03/14 1054

## 2014-10-26 ENCOUNTER — Encounter (HOSPITAL_COMMUNITY): Payer: Self-pay | Admitting: Emergency Medicine

## 2015-03-07 ENCOUNTER — Encounter (HOSPITAL_COMMUNITY): Payer: Self-pay | Admitting: Emergency Medicine

## 2015-03-07 ENCOUNTER — Emergency Department (HOSPITAL_COMMUNITY)
Admission: EM | Admit: 2015-03-07 | Discharge: 2015-03-07 | Disposition: A | Discharge status: Home / Self Care | Payer: PRIVATE HEALTH INSURANCE | Attending: Emergency Medicine | Admitting: Emergency Medicine

## 2015-03-07 DIAGNOSIS — H9202 Otalgia, left ear: Secondary | ICD-10-CM | POA: Diagnosis present

## 2015-03-07 DIAGNOSIS — Z79899 Other long term (current) drug therapy: Secondary | ICD-10-CM | POA: Diagnosis not present

## 2015-03-07 DIAGNOSIS — Z8742 Personal history of other diseases of the female genital tract: Secondary | ICD-10-CM | POA: Insufficient documentation

## 2015-03-07 DIAGNOSIS — Z862 Personal history of diseases of the blood and blood-forming organs and certain disorders involving the immune mechanism: Secondary | ICD-10-CM | POA: Diagnosis not present

## 2015-03-07 DIAGNOSIS — F419 Anxiety disorder, unspecified: Secondary | ICD-10-CM | POA: Diagnosis not present

## 2015-03-07 DIAGNOSIS — Z8619 Personal history of other infectious and parasitic diseases: Secondary | ICD-10-CM | POA: Insufficient documentation

## 2015-03-07 MED ORDER — AZITHROMYCIN 250 MG PO TABS: 250.0000 mg | ORAL_TABLET | Freq: Every day | ORAL | Status: DC

## 2015-03-07 NOTE — ED Provider Notes (Signed)
CSN: [id number]     Arrival date & time 03/07/15  0630 History   First MD Initiated Contact with Patient 03/07/15 [phone]     Chief Complaint  Patient presents with  . Otalgia     (Consider location/radiation/quality/duration/timing/severity/associated sxs/prior Treatment) HPI Comments: Patient here complaining of left ear pain and fullness. No drainage noted. Has had recent URI symptoms. Denies any headache. Pain characterized as sharp and persistent. Denies any sore throat. No treatment use prior to arrival. Nothing makes her symptoms better or worse  Patient is a 49 y.o. female presenting with ear pain. The history is provided by the patient.  Otalgia   Past Medical History  Diagnosis Date  . Anemia   . Fibroids     uterine  . Abnormal Pap smear   . BV (bacterial vaginosis) 05/16/2013  . Mental disorder     depression and panic attacks  . Anxiety    Past Surgical History  Procedure Laterality Date  . Tubal ligation    . Dilation and curettage of uterus    . Supracervical abdominal hysterectomy N/A 02/25/2014    Procedure: HYSTERECTOMY SUPRACERVICAL ABDOMINAL;  Surgeon: Florian Buff, MD;  Location: [facility];  Service: Gynecology;  Laterality: N/A;  . Bilateral salpingectomy Bilateral 02/25/2014    Procedure: BILATERAL SALPINGECTOMY;  Surgeon: Florian Buff, MD;  Location: [facility];  Service: Gynecology;  Laterality: Bilateral;  . Abdominal hysterectomy     Family History  Problem Relation Age of Onset  . Congestive Heart Failure Mother   . Congestive Heart Failure Maternal Grandmother   . Cancer Paternal Grandmother     ovarian   History  Substance Use Topics  . Smoking status: Never Smoker   . Smokeless tobacco: Never Used  . Alcohol Use: 4.2 oz/week    7 Cans of beer per week     Comment: 1 beer daily    OB History    Gravida Para Term Preterm AB TAB SAB Ectopic Multiple Living   7 5   2  2   5      Review of Systems  HENT: Positive for ear pain.   All other systems  reviewed and are negative.     Allergies  Review of patient's allergies indicates no known allergies.  Home Medications   Prior to Admission medications   Medication Sig Start Date End Date Taking? Authorizing Provider  metroNIDAZOLE (FLAGYL) 500 MG tablet Take 1 tablet (500 mg total) by mouth 2 (two) times daily. For 10 days 09/29/14   Tammi Triplett, PA-C   BP 147/78 mmHg  Pulse 74  Temp(Src) 98.2 F (36.8 C) (Oral)  Resp 14  Ht 5' 7"  (1.702 m)  Wt 145 lb (65.772 kg)  BMI 22.71 kg/m2  SpO2 98%  LMP 02/09/2014 Physical Exam  Constitutional: She is oriented to person, place, and time. She appears well-developed and well-nourished.  Non-toxic appearance.  HENT:  Head: Normocephalic and atraumatic.  Right Ear: Tympanic membrane normal.  Left Ear: No mastoid tenderness.  Moderate cerumen noted in ear canal. TM is slightly erythematous. External canal normal  Eyes: Conjunctivae are normal. Pupils are equal, round, and reactive to light.  Neck: Normal range of motion.  Cardiovascular: Normal rate.   Pulmonary/Chest: Effort normal.  Neurological: She is alert and oriented to person, place, and time.  Skin: Skin is warm and dry.  Psychiatric: She has a normal mood and affect.  Nursing note and vitals reviewed.   ED Course  Procedures (including  critical care time) Labs Review Labs Reviewed - No data to display  Imaging Review No results found.   EKG Interpretation None      MDM   Final diagnoses:  None    Patient with probable otitis media versus cerumen impaction. Patient will be placed on Z-Pak as well as instructed to purchase an earwax removal kit.    Lacretia Leigh, MD 03/07/15 [phone]

## 2015-03-07 NOTE — Discharge Instructions (Signed)
Purchase an earwax removal kit from your local pharmacy    Cerumen Impaction A cerumen impaction is when the wax in your ear forms a plug. This plug usually causes reduced hearing. Sometimes it also causes an earache or dizziness. Removing a cerumen impaction can be difficult and painful. The wax sticks to the ear canal. The canal is sensitive and bleeds easily. If you try to remove a heavy wax buildup with a cotton tipped swab, you may push it in further. Irrigation with water, suction, and small ear curettes may be used to clear out the wax. If the impaction is fixed to the skin in the ear canal, ear drops may be needed for a few days to loosen the wax. People who build up a lot of wax frequently can use ear wax removal products available in your local drugstore. SEEK MEDICAL CARE IF:  You develop an earache, increased hearing loss, or marked dizziness. Document Released: 01/18/2005 Document Revised: 03/04/2012 Document Reviewed: 03/10/2010 [facility] Patient Information 2015 [place], [place]. This information is not intended to replace advice given to you by your health care provider. Make sure you discuss any questions you have with your health care provider. Otitis Media Otitis media is redness, soreness, and inflammation of the middle ear. Otitis media may be caused by allergies or, most commonly, by infection. Often it occurs as a complication of the common cold. SIGNS AND SYMPTOMS Symptoms of otitis media may include:  Earache.  Fever.  Ringing in your ear.  Headache.  Leakage of fluid from the ear. DIAGNOSIS To diagnose otitis media, your health care provider will examine your ear with an otoscope. This is an instrument that allows your health care provider to see into your ear in order to examine your eardrum. Your health care provider also will ask you questions about your symptoms. TREATMENT  Typically, otitis media resolves on its own within 3-5 days. Your health care provider  may prescribe medicine to ease your symptoms of pain. If otitis media does not resolve within 5 days or is recurrent, your health care provider may prescribe antibiotic medicines if he or she suspects that a bacterial infection is the cause. HOME CARE INSTRUCTIONS   If you were prescribed an antibiotic medicine, finish it all even if you start to feel better.  Take medicines only as directed by your health care provider.  Keep all follow-up visits as directed by your health care provider. SEEK MEDICAL CARE IF:  You have otitis media only in one ear, or bleeding from your nose, or both.  You notice a lump on your neck.  You are not getting better in 3-5 days.  You feel worse instead of better. SEEK IMMEDIATE MEDICAL CARE IF:   You have pain that is not controlled with medicine.  You have swelling, redness, or pain around your ear or stiffness in your neck.  You notice that part of your face is paralyzed.  You notice that the bone behind your ear (mastoid) is tender when you touch it. MAKE SURE YOU:   Understand these instructions.  Will watch your condition.  Will get help right away if you are not doing well or get worse. Document Released: 09/15/2004 Document Revised: 04/27/2014 Document Reviewed: 07/08/2013 [facility] Patient Information 2015 [place], [place]. This information is not intended to replace advice given to you by your health care provider. Make sure you discuss any questions you have with your health care provider.

## 2015-03-07 NOTE — ED Notes (Signed)
Pt c/o left ear ache that began Friday. Denies any drainage.

## 2015-03-07 NOTE — ED Notes (Signed)
Pain to left ear x2 days. Has taken tylenol x 1 dose at 12am.

## 2015-05-11 ENCOUNTER — Emergency Department (HOSPITAL_COMMUNITY)
Admission: EM | Admit: 2015-05-11 | Discharge: 2015-05-11 | Disposition: A | Discharge status: Home / Self Care | Payer: PRIVATE HEALTH INSURANCE | Attending: Emergency Medicine | Admitting: Emergency Medicine

## 2015-05-11 ENCOUNTER — Encounter (HOSPITAL_COMMUNITY): Payer: Self-pay | Admitting: Emergency Medicine

## 2015-05-11 ENCOUNTER — Emergency Department (HOSPITAL_COMMUNITY): Payer: PRIVATE HEALTH INSURANCE

## 2015-05-11 DIAGNOSIS — Z8742 Personal history of other diseases of the female genital tract: Secondary | ICD-10-CM | POA: Diagnosis not present

## 2015-05-11 DIAGNOSIS — I471 Supraventricular tachycardia: Secondary | ICD-10-CM | POA: Diagnosis not present

## 2015-05-11 DIAGNOSIS — Z8719 Personal history of other diseases of the digestive system: Secondary | ICD-10-CM | POA: Diagnosis not present

## 2015-05-11 DIAGNOSIS — Z792 Long term (current) use of antibiotics: Secondary | ICD-10-CM | POA: Insufficient documentation

## 2015-05-11 DIAGNOSIS — Z8659 Personal history of other mental and behavioral disorders: Secondary | ICD-10-CM | POA: Insufficient documentation

## 2015-05-11 DIAGNOSIS — R0602 Shortness of breath: Secondary | ICD-10-CM | POA: Diagnosis present

## 2015-05-11 DIAGNOSIS — Z86018 Personal history of other benign neoplasm: Secondary | ICD-10-CM | POA: Diagnosis not present

## 2015-05-11 DIAGNOSIS — Z862 Personal history of diseases of the blood and blood-forming organs and certain disorders involving the immune mechanism: Secondary | ICD-10-CM | POA: Diagnosis not present

## 2015-05-11 LAB — RAPID URINE DRUG SCREEN, HOSP PERFORMED
Amphetamines: NOT DETECTED
Barbiturates: NOT DETECTED
Benzodiazepines: NOT DETECTED
Cocaine: NOT DETECTED
Opiates: NOT DETECTED
Tetrahydrocannabinol: NOT DETECTED

## 2015-05-11 LAB — CBC WITH DIFFERENTIAL/PLATELET
Basophils Absolute: 0 10*3/uL (ref 0.0–0.1)
Basophils Relative: 1 % (ref 0–1)
Eosinophils Absolute: 0.1 10*3/uL (ref 0.0–0.7)
Eosinophils Relative: 2 % (ref 0–5)
HCT: 42.4 % (ref 36.0–46.0)
Hemoglobin: 14.2 g/dL (ref 12.0–15.0)
Lymphocytes Relative: 38 % (ref 12–46)
Lymphs Abs: 2.5 10*3/uL (ref 0.7–4.0)
MCH: 29.3 pg (ref 26.0–34.0)
MCHC: 33.5 g/dL (ref 30.0–36.0)
MCV: 87.6 fL (ref 78.0–100.0)
Monocytes Absolute: 0.3 10*3/uL (ref 0.1–1.0)
Monocytes Relative: 4 % (ref 3–12)
Neutro Abs: 3.6 10*3/uL (ref 1.7–7.7)
Neutrophils Relative %: 55 % (ref 43–77)
Platelets: 233 10*3/uL (ref 150–400)
RBC: 4.84 MIL/uL (ref 3.87–5.11)
RDW: 14.1 % (ref 11.5–15.5)
WBC: 6.5 10*3/uL (ref 4.0–10.5)

## 2015-05-11 LAB — I-STAT TROPONIN, ED: Troponin i, poc: 0.07 ng/mL (ref 0.00–0.08)

## 2015-05-11 LAB — COMPREHENSIVE METABOLIC PANEL
ALT: 50 U/L (ref 14–54)
AST: 117 U/L — ABNORMAL HIGH (ref 15–41)
Albumin: 3.9 g/dL (ref 3.5–5.0)
Alkaline Phosphatase: 74 U/L (ref 38–126)
Anion gap: 10 (ref 5–15)
BUN: 14 mg/dL (ref 6–20)
CO2: 25 mmol/L (ref 22–32)
Calcium: 9 mg/dL (ref 8.9–10.3)
Chloride: 104 mmol/L (ref 101–111)
Creatinine, Ser: 1.16 mg/dL — ABNORMAL HIGH (ref 0.44–1.00)
GFR calc Af Amer: >60 mL/min (ref >60)
GFR calc non Af Amer: 54 mL/min — ABNORMAL LOW (ref >60)
Glucose, Bld: 142 mg/dL — ABNORMAL HIGH (ref 65–99)
Potassium: 3.7 mmol/L (ref 3.5–5.1)
Sodium: 139 mmol/L (ref 135–145)
Total Bilirubin: 0.9 mg/dL (ref 0.3–1.2)
Total Protein: 7.3 g/dL (ref 6.5–8.1)

## 2015-05-11 LAB — TSH: TSH: 2.223 u[IU]/mL (ref 0.350–4.500)

## 2015-05-11 LAB — TROPONIN I: Troponin I: <0.03 ng/mL (ref <0.031)

## 2015-05-11 LAB — MAGNESIUM: Magnesium: 1.8 mg/dL (ref 1.7–2.4)

## 2015-05-11 LAB — T4, FREE: Free T4: 0.78 ng/dL (ref 0.61–1.12)

## 2015-05-11 MED ORDER — METOPROLOL TARTRATE 1 MG/ML IV SOLN
5.0000 mg | Freq: Once | INTRAVENOUS | Status: DC
Start: 2015-05-11 — End: 2015-05-11

## 2015-05-11 MED ORDER — ADENOSINE 6 MG/2ML IV SOLN
6.0000 mg | Freq: Once | INTRAVENOUS | Status: AC
  Administered 2015-05-11: 6 mg via INTRAVENOUS

## 2015-05-11 MED ORDER — DILTIAZEM HCL 30 MG PO TABS: ORAL_TABLET | ORAL | Status: DC

## 2015-05-11 MED ORDER — ADENOSINE 6 MG/2ML IV SOLN
INTRAVENOUS | Status: AC
  Filled 2015-05-11: qty 4

## 2015-05-11 MED ORDER — METOPROLOL TARTRATE 1 MG/ML IV SOLN
INTRAVENOUS | Status: AC
  Filled 2015-05-11: qty 5

## 2015-05-11 NOTE — ED Notes (Signed)
Patient assisted to restroom obtained urine specimen. Patient resting at this time.

## 2015-05-11 NOTE — Discharge Instructions (Signed)
Call the cardiologist's office to get an appointment to finish evaluating your racing heart tonight. You had "supraventricular tachycardia with heart rate up to 215". If you feel your heart start to race like that again, take the diltiazem pill and sit down and if you aren't improving in the next 20-30 minutes call 911 to bring you to the ED.     Supraventricular Tachycardia Supraventricular tachycardia (SVT) is when the heart beats very fast. SVT can last for a long time (sustained) or it can start and stop suddenly (nonsustained). HOME CARE   Take your heart medicine as told by your doctor. Check with your doctor before taking cold, diet, or herbal medicine.  Do not smoke.  Do not drink large amounts of caffeine.Caffeine is found in coffee, tea, soda (pop, cola), and chocolate.  Keep all doctor visits as told. GET HELP RIGHT AWAY IF:   You have chest pain or pressure.  You cannot catch your breath.  You are dizzy or lightheaded.  You feel like you will pass out (faint).  You are sweaty (diaphoretic) and feel sick to your stomach (nauseous) or throw up (vomit).  If you have the above problems, call your local emergency services (911 in [place] right away. Do not drive yourself to the hospital. MAKE SURE YOU:   Understand these instructions.  Will watch your condition.  Will get help right away if you are not doing well or get worse. Document Released: 12/11/2005 Document Revised: 03/04/2012 Document Reviewed: 03/17/2009 [facility] Patient Information 2015 [place], [place]. This information is not intended to replace advice given to you by your health care provider. Make sure you discuss any questions you have with your health care provider.

## 2015-05-11 NOTE — ED Provider Notes (Signed)
CSN: [id number]     Arrival date & time 05/11/15  0103 History   First MD Initiated Contact with Patient 05/11/15 0120     Chief Complaint  Patient presents with  . Shortness of Breath     (Consider location/radiation/quality/duration/timing/severity/associated sxs/prior Treatment) HPI  Patient states she was getting ready to go to bed about 11 PM tonight and she had acute onset of shortness of breath, feeling lightheaded, feeling dizzy, and having a chest heaviness. She states she's never had this sensation before. She states she has a history of panic attacks and has had her heart racing with that however  this feels different.  PCP Will be Dr Everette Rank  Past Medical History  Diagnosis Date  . Anemia   . Fibroids     uterine  . Abnormal Pap smear   . BV (bacterial vaginosis) 05/16/2013  . Mental disorder     depression and panic attacks  . Anxiety    Past Surgical History  Procedure Laterality Date  . Tubal ligation    . Dilation and curettage of uterus    . Supracervical abdominal hysterectomy N/A 02/25/2014    Procedure: HYSTERECTOMY SUPRACERVICAL ABDOMINAL;  Surgeon: Florian Buff, MD;  Location: [facility];  Service: Gynecology;  Laterality: N/A;  . Bilateral salpingectomy Bilateral 02/25/2014    Procedure: BILATERAL SALPINGECTOMY;  Surgeon: Florian Buff, MD;  Location: [facility];  Service: Gynecology;  Laterality: Bilateral;  . Abdominal hysterectomy     Family History  Problem Relation Age of Onset  . Congestive Heart Failure Mother   . Congestive Heart Failure Maternal Grandmother   . Cancer Paternal Grandmother     ovarian   History  Substance Use Topics  . Smoking status: Never Smoker   . Smokeless tobacco: Never Used  . Alcohol Use: 4.2 oz/week    7 Cans of beer per week     Comment: 1 beer daily    Drank 1 beer night as usual employed  OB History    Gravida Para Term Preterm AB TAB SAB Ectopic Multiple Living   7 5   2  2   5      Review of Systems  All  other systems reviewed and are negative.     Allergies  Review of patient's allergies indicates no known allergies.  Home Medications   Prior to Admission medications   Medication Sig Start Date End Date Taking? Authorizing Provider  azithromycin (ZITHROMAX) 250 MG tablet Take 1 tablet (250 mg total) by mouth daily. Take first 2 tablets together, then 1 every day until finished. 03/07/15   Lacretia Leigh, MD  diltiazem (CARDIZEM) 30 MG tablet Take 1 prn heart racing 05/11/15   Rolland Porter, MD  metroNIDAZOLE (FLAGYL) 500 MG tablet Take 1 tablet (500 mg total) by mouth 2 (two) times daily. For 10 days 09/29/14   Kem Parkinson, PA-C   Denies medications  BP 94/66 mmHg  Pulse 215  Temp(Src) 97.9 F (36.6 C)  Resp 21  Ht 5\' 7"  (1.702 m)  Wt 140 lb (63.504 kg)  BMI 21.92 kg/m2  SpO2 100%  LMP 02/09/2014  Vital signs normal except for tachycardia and mild hypotension  Physical Exam  Constitutional: She is oriented to person, place, and time. She appears well-developed and well-nourished.  Non-toxic appearance. She does not appear ill. She appears distressed.  HENT:  Head: Normocephalic and atraumatic.  Right Ear: External ear normal.  Left Ear: External ear normal.  Nose: Nose normal. No mucosal  edema or rhinorrhea.  Mouth/Throat: Oropharynx is clear and moist and mucous membranes are normal. No dental abscesses or uvula swelling.  Eyes: Conjunctivae and EOM are normal. Pupils are equal, round, and reactive to light.  Neck: Normal range of motion and full passive range of motion without pain. Neck supple.  Cardiovascular: Normal heart sounds.  An irregular rhythm present. Tachycardia present.  Exam reveals no gallop and no friction rub.   No murmur heard. Heart rate noted to be 180s to 205  Pulmonary/Chest: Effort normal and breath sounds normal. No respiratory distress. She has no wheezes. She has no rhonchi. She has no rales. She exhibits no tenderness and no crepitus.  Abdominal:  Soft. Normal appearance and bowel sounds are normal. She exhibits no distension. There is no tenderness. There is no rebound and no guarding.  Musculoskeletal: Normal range of motion. She exhibits no edema or tenderness.  Moves all extremities well.   Neurological: She is alert and oriented to person, place, and time. She has normal strength. No cranial nerve deficit.  Skin: Skin is warm, dry and intact. No rash noted. No erythema. No pallor.  Psychiatric: She has a normal mood and affect. Her speech is normal and behavior is normal. Her mood appears not anxious.  Nursing note and vitals reviewed.   ED Course  Procedures (including critical care time)  Medications  adenosine (ADENOCARD) 6 MG/2ML injection 6 mg (6 mg Intravenous Given 05/11/15 0129)   Patient was given adenosine 6 mg rapid IV push. She had some irregular beats and then converted to sinus tachycardia with heart rate 140s and slowly came down to 104 at 1:30 AM since this was her first episode laboratory evaluation and chest x-ray was done.  Recheck at 3:30 AM patient has heart rate of 100 up to 110. Patient states she's feeling much improved. Second troponin ordered and I am going to talk to cardiology about her resting mild tachycardia.  03:51 Dr Jules Husbands, cardiology, recommends just giving her a prn diltiazem 30 mg to use if her heart races again until she can have her cardiology referral.   Pt was ambulated by nursing staff with pulse ox 95-100% and heart rate 100-106. She denied having any shortness breath or chest pain.   Labs Review Results for orders placed or performed during the hospital encounter of 05/11/15  Comprehensive metabolic panel  Result Value Ref Range   Sodium 139 135 - 145 mmol/L   Potassium 3.7 3.5 - 5.1 mmol/L   Chloride 104 101 - 111 mmol/L   CO2 25 22 - 32 mmol/L   Glucose, Bld 142 (H) 65 - 99 mg/dL   BUN 14 6 - 20 mg/dL   Creatinine, Ser 1.16 (H) 0.44 - 1.00 mg/dL   Calcium 9.0 8.9 - 10.3  mg/dL   Total Protein 7.3 6.5 - 8.1 g/dL   Albumin 3.9 3.5 - 5.0 g/dL   AST 117 (H) 15 - 41 U/L   ALT 50 14 - 54 U/L   Alkaline Phosphatase 74 38 - 126 U/L   Total Bilirubin 0.9 0.3 - 1.2 mg/dL   GFR calc non Af Amer 54 (L) >60 mL/min   GFR calc Af Amer >60 >60 mL/min   Anion gap 10 5 - 15  Troponin I  Result Value Ref Range   Troponin I <0.03 <0.031 ng/mL  CBC with Differential  Result Value Ref Range   WBC 6.5 4.0 - 10.5 K/uL   RBC 4.84 3.87 - 5.11  MIL/uL   Hemoglobin 14.2 12.0 - 15.0 g/dL   HCT 42.4 36.0 - 46.0 %   MCV 87.6 78.0 - 100.0 fL   MCH 29.3 26.0 - 34.0 pg   MCHC 33.5 30.0 - 36.0 g/dL   RDW 14.1 11.5 - 15.5 %   Platelets 233 150 - 400 K/uL   Neutrophils Relative % 55 43 - 77 %   Neutro Abs 3.6 1.7 - 7.7 K/uL   Lymphocytes Relative 38 12 - 46 %   Lymphs Abs 2.5 0.7 - 4.0 K/uL   Monocytes Relative 4 3 - 12 %   Monocytes Absolute 0.3 0.1 - 1.0 K/uL   Eosinophils Relative 2 0 - 5 %   Eosinophils Absolute 0.1 0.0 - 0.7 K/uL   Basophils Relative 1 0 - 1 %   Basophils Absolute 0.0 0.0 - 0.1 K/uL  TSH  Result Value Ref Range   TSH 2.223 0.350 - 4.500 uIU/mL  Urine rapid drug screen (hosp performed)  Result Value Ref Range   Opiates NONE DETECTED NONE DETECTED   Cocaine NONE DETECTED NONE DETECTED   Benzodiazepines NONE DETECTED NONE DETECTED   Amphetamines NONE DETECTED NONE DETECTED   Tetrahydrocannabinol NONE DETECTED NONE DETECTED   Barbiturates NONE DETECTED NONE DETECTED  Magnesium  Result Value Ref Range   Magnesium 1.8 1.7 - 2.4 mg/dL  I-Stat Troponin, ED (not at [facility])  Result Value Ref Range   Troponin i, poc 0.07 0.00 - 0.08 ng/mL   Comment 3            Laboratory interpretation all normal    Imaging Review Dg Chest Port 1 View  05/11/2015   CLINICAL DATA:  Acute onset of sharp central chest pain. Initial encounter.  EXAM: PORTABLE CHEST - 1 VIEW  COMPARISON:  None.  FINDINGS: The lungs are well-aerated. Vascular congestion is noted. There is no  evidence of focal opacification, pleural effusion or pneumothorax.  The cardiomediastinal silhouette is within normal limits. No acute osseous abnormalities are seen.  IMPRESSION: Vascular congestion noted; lungs remain grossly clear.   Electronically Signed   By: Garald Balding M.D.   On: 05/11/2015 02:32     EKG Interpretation   Date/Time:  Tuesday May 11 2015 01:13:41 EDT Ventricular Rate:  204 PR Interval:    QRS Duration: 62 QT Interval:  210 QTC Calculation: 387 R Axis:   22 Text Interpretation:  Since last tracing 03 Apr 2012 Supraventricular  tachycardia Marked ST abnormality, possible inferior subendocardial injury  Marked ST abnormality, possible anterior subendocardial injury Confirmed  by Alizia Greif  MD-I, Carvin Almas ([id number]) on 05/11/2015 1:21:15 AM   # 2 post adenosine EKG  ED ECG REPORT   Date: 05/11/2015  Rate: 114  Rhythm: sinus tachycardia  QRS Axis: normal  Intervals: normal  ST/T Wave abnormalities: nonspecific ST changes  Conduction Disutrbances:none  Narrative Interpretation:   Old EKG Reviewed: changes noted from earlier same day, SVT is now resolved       MDM   Final diagnoses:  SVT (supraventricular tachycardia)   New Prescriptions   DILTIAZEM (CARDIZEM) 30 MG TABLET    Take 1 prn heart racing    Plan discharge  Rolland Porter, MD, Barbette Or, MD 05/11/15 [phone]

## 2015-05-11 NOTE — ED Notes (Signed)
Ambulated patient around nurses station with pulse ox. Heart rate in range of 100 to 106. Oxygen saturations 95 to 100. Patient states that she is not having any shortness of breath or chest pain at this time. Patient has steady gait.

## 2015-05-11 NOTE — ED Notes (Signed)
Pt c/o sob that started around 2300

## 2015-09-15 ENCOUNTER — Emergency Department (HOSPITAL_COMMUNITY)
Admission: EM | Admit: 2015-09-15 | Discharge: 2015-09-15 | Disposition: A | Discharge status: Home / Self Care | Payer: PRIVATE HEALTH INSURANCE | Attending: Emergency Medicine | Admitting: Emergency Medicine

## 2015-09-15 ENCOUNTER — Encounter (HOSPITAL_COMMUNITY): Payer: Self-pay | Admitting: *Deleted

## 2015-09-15 DIAGNOSIS — Z862 Personal history of diseases of the blood and blood-forming organs and certain disorders involving the immune mechanism: Secondary | ICD-10-CM | POA: Insufficient documentation

## 2015-09-15 DIAGNOSIS — Z207 Contact with and (suspected) exposure to pediculosis, acariasis and other infestations: Secondary | ICD-10-CM | POA: Diagnosis not present

## 2015-09-15 DIAGNOSIS — Z8619 Personal history of other infectious and parasitic diseases: Secondary | ICD-10-CM | POA: Diagnosis not present

## 2015-09-15 DIAGNOSIS — Z8742 Personal history of other diseases of the female genital tract: Secondary | ICD-10-CM | POA: Diagnosis not present

## 2015-09-15 DIAGNOSIS — Z2089 Contact with and (suspected) exposure to other communicable diseases: Secondary | ICD-10-CM

## 2015-09-15 DIAGNOSIS — Z86018 Personal history of other benign neoplasm: Secondary | ICD-10-CM | POA: Insufficient documentation

## 2015-09-15 DIAGNOSIS — Z8659 Personal history of other mental and behavioral disorders: Secondary | ICD-10-CM | POA: Insufficient documentation

## 2015-09-15 DIAGNOSIS — T148XXA Other injury of unspecified body region, initial encounter: Secondary | ICD-10-CM

## 2015-09-15 DIAGNOSIS — L988 Other specified disorders of the skin and subcutaneous tissue: Secondary | ICD-10-CM | POA: Diagnosis present

## 2015-09-15 MED ORDER — BACITRACIN ZINC 500 UNIT/GM EX OINT
TOPICAL_OINTMENT | CUTANEOUS | Status: AC
  Filled 2015-09-15: qty 2.7

## 2015-09-15 MED ORDER — HYDROXYZINE HCL 50 MG PO TABS: 50.0000 mg | ORAL_TABLET | Freq: Three times a day (TID) | ORAL | Status: DC | PRN

## 2015-09-15 MED ORDER — PERMETHRIN 5 % EX CREA: TOPICAL_CREAM | CUTANEOUS | Status: DC

## 2015-09-15 MED ORDER — BACITRACIN ZINC 500 UNIT/GM EX OINT
TOPICAL_OINTMENT | Freq: Once | CUTANEOUS | Status: AC
  Administered 2015-09-15: 1 via TOPICAL

## 2015-09-15 MED ORDER — HYDROXYZINE HCL 25 MG PO TABS
50.0000 mg | ORAL_TABLET | Freq: Once | ORAL | Status: AC
  Administered 2015-09-15: 50 mg via ORAL
  Filled 2015-09-15: qty 2

## 2015-09-15 NOTE — ED Notes (Signed)
Pt c/o multiple red, painful areas; one to right chest and 2 places to arm; pt has large reddened area to right elbow; pt states yesterday it had some clear drainage coming from right elbow

## 2015-09-15 NOTE — Discharge Instructions (Signed)
You were seen today for itchy red areas on her body. You may have been bitten by some. Given your exposure to scabies, it is reasonable to treat. You can take Atarax as needed for itching. If you develop increasing redness, fevers, any new or worsening symptoms she should be reevaluated.  Scabies Scabies are small bugs (mites) that burrow under the skin and cause red bumps and severe itching. These bugs can only be seen with a microscope. Scabies are highly contagious. They can spread easily from person to person by direct contact. They are also spread through sharing clothing or linens that have the scabies mites living in them. It is not unusual for an entire family to become infected through shared towels, clothing, or bedding.  HOME CARE INSTRUCTIONS   Your caregiver may prescribe a cream or lotion to kill the mites. If cream is prescribed, massage the cream into the entire body from the neck to the bottom of both feet. Also massage the cream into the scalp and face if your child is less than 3 year old. Avoid the eyes and mouth. Do not wash your hands after application.  Leave the cream on for 8 to 12 hours. Your child should bathe or shower after the 8 to 12 hour application period. Sometimes it is helpful to apply the cream to your child right before bedtime.  One treatment is usually effective and will eliminate approximately 95% of infestations. For severe cases, your caregiver may decide to repeat the treatment in 1 week. Everyone in your household should be treated with one application of the cream.  New rashes or burrows should not appear within 24 to 48 hours after successful treatment. However, the itching and rash may last for 2 to 4 weeks after successful treatment. Your caregiver may prescribe a medicine to help with the itching or to help the rash go away more quickly.  Scabies can live on clothing or linens for up to 3 days. All of your child's recently used clothing, towels, stuffed  toys, and bed linens should be washed in hot water and then dried in a dryer for at least 20 minutes on high heat. Items that cannot be washed should be enclosed in a plastic bag for at least 3 days.  To help relieve itching, bathe your child in a cool bath or apply cool washcloths to the affected areas.  Your child may return to school after treatment with the prescribed cream. SEEK MEDICAL CARE IF:   The itching persists longer than 4 weeks after treatment.  The rash spreads or becomes infected. Signs of infection include red blisters or yellow-tan crust. Document Released: 12/11/2005 Document Revised: 03/04/2012 Document Reviewed: 04/21/2009 [facility] Patient Information 2015 [place], [place]. This information is not intended to replace advice given to you by your health care provider. Make sure you discuss any questions you have with your health care provider.

## 2015-09-15 NOTE — ED Provider Notes (Addendum)
CSN: [id number]     Arrival date & time 09/15/15  0049 History   First MD Initiated Contact with Patient 09/15/15 0056     Chief Complaint  Patient presents with  . Arm Swelling     (Consider location/radiation/quality/duration/timing/severity/associated sxs/prior Treatment) HPI  This is a 49 year old female who presents with redness to her right arm and chest. Patient reports that she may have been bit by something. She noted itchy painful spots to the right elbow, right upper arm, and right chest. She is taking Benadryl without relief.  The spots are now painful, 5/10. Patient reports that she has had a recent exposure to scabies. She has not noted any other lesions specifically on her hands. She denies any fevers.   Past Medical History  Diagnosis Date  . Anemia   . Fibroids     uterine  . Abnormal Pap smear   . BV (bacterial vaginosis) 05/16/2013  . Mental disorder     depression and panic attacks  . Anxiety    Past Surgical History  Procedure Laterality Date  . Tubal ligation    . Dilation and curettage of uterus    . Supracervical abdominal hysterectomy N/A 02/25/2014    Procedure: HYSTERECTOMY SUPRACERVICAL ABDOMINAL;  Surgeon: Florian Buff, MD;  Location: [facility];  Service: Gynecology;  Laterality: N/A;  . Bilateral salpingectomy Bilateral 02/25/2014    Procedure: BILATERAL SALPINGECTOMY;  Surgeon: Florian Buff, MD;  Location: [facility];  Service: Gynecology;  Laterality: Bilateral;  . Abdominal hysterectomy     Family History  Problem Relation Age of Onset  . Congestive Heart Failure Mother   . Congestive Heart Failure Maternal Grandmother   . Cancer Paternal Grandmother     ovarian   Social History  Substance Use Topics  . Smoking status: Never Smoker   . Smokeless tobacco: Never Used  . Alcohol Use: 4.2 oz/week    7 Cans of beer per week     Comment: 1 beer daily    OB History    Gravida Para Term Preterm AB TAB SAB Ectopic Multiple Living   7 5   2  2   5       Review of Systems  Constitutional: Negative for fever.  Skin: Positive for color change.       Bites  All other systems reviewed and are negative.     Allergies  Review of patient's allergies indicates no known allergies.  Home Medications   Prior to Admission medications   Medication Sig Start Date End Date Taking? Authorizing Provider  diltiazem (CARDIZEM) 30 MG tablet Take 1 prn heart racing 05/11/15  Yes Rolland Porter, MD  hydrOXYzine (ATARAX/VISTARIL) 50 MG tablet Take 1 tablet (50 mg total) by mouth every 8 (eight) hours as needed for itching. 09/15/15   Merryl Hacker, MD  permethrin (ELIMITE) 5 % cream Thoroughly massage cream from head to soles of feet; leave on for 8 to 14 hours before removing (shower or bath) 09/15/15   Merryl Hacker, MD   BP 154/84 mmHg  Pulse 83  Temp(Src) 97.8 F (36.6 C) (Oral)  Resp 16  Ht 5\' 7"  (1.702 m)  Wt 140 lb (63.504 kg)  BMI 21.92 kg/m2  SpO2 100%  LMP 02/09/2014 Physical Exam  Constitutional: She is oriented to person, place, and time. She appears well-developed and well-nourished.  HENT:  Head: Normocephalic and atraumatic.  Cardiovascular: Normal rate and regular rhythm.   Pulmonary/Chest: Effort normal. No respiratory distress.  Neurological: She is alert and oriented to person, place, and time.  Skin: Skin is warm and dry.  2 mm excoriated lesion on the right distal humerus, no surrounding erythema or fluctuance, similar lesion over skin of the proximal humerus and right chest, no hives noted, no lesions noted in the web spaces of the hands  Psychiatric: She has a normal mood and affect.  Nursing note and vitals reviewed.   ED Course  Procedures (including critical care time) Labs Review Labs Reviewed - No data to display  Imaging Review No results found. I have personally reviewed and evaluated these images and lab results as  part of my medical decision-making.   EKG Interpretation None      MDM   Final  diagnoses:  Scabies exposure  Excoriation   Patient presents with multiple "bites." She reports they're itchy and now painful.  Reports recent exposure to scabies. She has several very small excoriated skin openings on the arm and right chest, no obvious cellulitis or abscess. Unclear whether this is actually scabies. No other lesions noted. Given exposure, feel is reasonable to treat. Also discussed with patient Atarax for itching and bacitracin to the lesions to prevent infection.  After history, exam, and medical workup I feel the patient has been appropriately medically screened and is safe for discharge home. Pertinent diagnoses were discussed with the patient. Patient was given return precautions.   Merryl Hacker, MD 09/15/15 0762  Merryl Hacker, MD 09/15/15 [phone]

## 2016-01-19 ENCOUNTER — Emergency Department (HOSPITAL_COMMUNITY): Payer: PRIVATE HEALTH INSURANCE

## 2016-01-19 ENCOUNTER — Emergency Department (HOSPITAL_COMMUNITY)
Admission: EM | Admit: 2016-01-19 | Discharge: 2016-01-19 | Disposition: A | Discharge status: Home / Self Care | Payer: PRIVATE HEALTH INSURANCE | Attending: Emergency Medicine | Admitting: Emergency Medicine

## 2016-01-19 ENCOUNTER — Encounter (HOSPITAL_COMMUNITY): Payer: Self-pay | Admitting: Emergency Medicine

## 2016-01-19 DIAGNOSIS — Z86018 Personal history of other benign neoplasm: Secondary | ICD-10-CM | POA: Diagnosis not present

## 2016-01-19 DIAGNOSIS — R0602 Shortness of breath: Secondary | ICD-10-CM | POA: Insufficient documentation

## 2016-01-19 DIAGNOSIS — R0789 Other chest pain: Secondary | ICD-10-CM | POA: Diagnosis not present

## 2016-01-19 DIAGNOSIS — R079 Chest pain, unspecified: Secondary | ICD-10-CM | POA: Diagnosis present

## 2016-01-19 DIAGNOSIS — F99 Mental disorder, not otherwise specified: Secondary | ICD-10-CM | POA: Insufficient documentation

## 2016-01-19 DIAGNOSIS — Z8742 Personal history of other diseases of the female genital tract: Secondary | ICD-10-CM | POA: Diagnosis not present

## 2016-01-19 DIAGNOSIS — F419 Anxiety disorder, unspecified: Secondary | ICD-10-CM | POA: Insufficient documentation

## 2016-01-19 DIAGNOSIS — Z862 Personal history of diseases of the blood and blood-forming organs and certain disorders involving the immune mechanism: Secondary | ICD-10-CM | POA: Insufficient documentation

## 2016-01-19 DIAGNOSIS — I10 Essential (primary) hypertension: Secondary | ICD-10-CM | POA: Insufficient documentation

## 2016-01-19 HISTORY — DX: Essential (primary) hypertension: I10

## 2016-01-19 LAB — D-DIMER, QUANTITATIVE: D-Dimer, Quant: 0.3 ug/mL-FEU (ref 0.00–0.50)

## 2016-01-19 LAB — BASIC METABOLIC PANEL
Anion gap: 6 (ref 5–15)
BUN: 10 mg/dL (ref 6–20)
CO2: 27 mmol/L (ref 22–32)
Calcium: 9.3 mg/dL (ref 8.9–10.3)
Chloride: 105 mmol/L (ref 101–111)
Creatinine, Ser: 0.96 mg/dL (ref 0.44–1.00)
GFR calc Af Amer: >60 mL/min (ref >60)
GFR calc non Af Amer: >60 mL/min (ref >60)
Glucose, Bld: 76 mg/dL (ref 65–99)
Potassium: 4 mmol/L (ref 3.5–5.1)
Sodium: 138 mmol/L (ref 135–145)

## 2016-01-19 LAB — TROPONIN I
Troponin I: <0.03 ng/mL (ref <0.031)
Troponin I: <0.03 ng/mL (ref <0.031)

## 2016-01-19 LAB — CBC
HCT: 41.9 % (ref 36.0–46.0)
Hemoglobin: 14 g/dL (ref 12.0–15.0)
MCH: 29 pg (ref 26.0–34.0)
MCHC: 33.4 g/dL (ref 30.0–36.0)
MCV: 86.9 fL (ref 78.0–100.0)
Platelets: 242 10*3/uL (ref 150–400)
RBC: 4.82 MIL/uL (ref 3.87–5.11)
RDW: 14 % (ref 11.5–15.5)
WBC: 6.9 10*3/uL (ref 4.0–10.5)

## 2016-01-19 MED ORDER — GI COCKTAIL ~~LOC~~
30.0000 mL | Freq: Once | ORAL | Status: AC
  Administered 2016-01-19: 30 mL via ORAL
  Filled 2016-01-19: qty 30

## 2016-01-19 MED ORDER — KETOROLAC TROMETHAMINE 30 MG/ML IJ SOLN
30.0000 mg | Freq: Once | INTRAMUSCULAR | Status: AC
  Administered 2016-01-19: 30 mg via INTRAVENOUS
  Filled 2016-01-19: qty 1

## 2016-01-19 MED ORDER — OMEPRAZOLE 20 MG PO CPDR: 20.0000 mg | DELAYED_RELEASE_CAPSULE | Freq: Every day | ORAL | Status: DC

## 2016-01-19 NOTE — Discharge Instructions (Signed)
Nonspecific Chest Pain  There is no evidence of a heart attack or blood clot in the lung. Follow-up with your doctor for a stress test. Return to the ED if he develop new or worsening symptoms. Chest pain can be caused by many different conditions. There is always a chance that your pain could be related to something serious, such as a heart attack or a blood clot in your lungs. Chest pain can also be caused by conditions that are not life-threatening. If you have chest pain, it is very important to follow up with your health care provider. CAUSES  Chest pain can be caused by:  Heartburn.  Pneumonia or bronchitis.  Anxiety or stress.  Inflammation around your heart (pericarditis) or lung (pleuritis or pleurisy).  A blood clot in your lung.  A collapsed lung (pneumothorax). It can develop suddenly on its own (spontaneous pneumothorax) or from trauma to the chest.  Shingles infection (varicella-zoster virus).  Heart attack.  Damage to the bones, muscles, and cartilage that make up your chest wall. This can include:  Bruised bones due to injury.  Strained muscles or cartilage due to frequent or repeated coughing or overwork.  Fracture to one or more ribs.  Sore cartilage due to inflammation (costochondritis). RISK FACTORS  Risk factors for chest pain may include:  Activities that increase your risk for trauma or injury to your chest.  Respiratory infections or conditions that cause frequent coughing.  Medical conditions or overeating that can cause heartburn.  Heart disease or family history of heart disease.  Conditions or health behaviors that increase your risk of developing a blood clot.  Having had chicken pox (varicella zoster). SIGNS AND SYMPTOMS Chest pain can feel like:  Burning or tingling on the surface of your chest or deep in your chest.  Crushing, pressure, aching, or squeezing pain.  Dull or sharp pain that is worse when you move, cough, or take a deep  breath.  Pain that is also felt in your back, neck, shoulder, or arm, or pain that spreads to any of these areas. Your chest pain may come and go, or it may stay constant. DIAGNOSIS Lab tests or other studies may be needed to find the cause of your pain. Your health care provider may have you take a test called an ambulatory ECG (electrocardiogram). An ECG records your heartbeat patterns at the time the test is performed. You may also have other tests, such as:  Transthoracic echocardiogram (TTE). During echocardiography, sound waves are used to create a picture of all of the heart structures and to look at how blood flows through your heart.  Transesophageal echocardiogram (TEE).This is a more advanced imaging test that obtains images from inside your body. It allows your health care provider to see your heart in finer detail.  Cardiac monitoring. This allows your health care provider to monitor your heart rate and rhythm in real time.  Holter monitor. This is a portable device that records your heartbeat and can help to diagnose abnormal heartbeats. It allows your health care provider to track your heart activity for several days, if needed.  Stress tests. These can be done through exercise or by taking medicine that makes your heart beat more quickly.  Blood tests.  Imaging tests. TREATMENT  Your treatment depends on what is causing your chest pain. Treatment may include:  Medicines. These may include:  Acid blockers for heartburn.  Anti-inflammatory medicine.  Pain medicine for inflammatory conditions.  Antibiotic medicine, if an infection  is present.  Medicines to dissolve blood clots.  Medicines to treat coronary artery disease.  Supportive care for conditions that do not require medicines. This may include:  Resting.  Applying heat or cold packs to injured areas.  Limiting activities until pain decreases. HOME CARE INSTRUCTIONS  If you were prescribed an  antibiotic medicine, finish it all even if you start to feel better.  Avoid any activities that bring on chest pain.  Do not use any tobacco products, including cigarettes, chewing tobacco, or electronic cigarettes. If you need help quitting, ask your health care provider.  Do not drink alcohol.  Take medicines only as directed by your health care provider.  Keep all follow-up visits as directed by your health care provider. This is important. This includes any further testing if your chest pain does not go away.  If heartburn is the cause for your chest pain, you may be told to keep your head raised (elevated) while sleeping. This reduces the chance that acid will go from your stomach into your esophagus.  Make lifestyle changes as directed by your health care provider. These may include:  Getting regular exercise. Ask your health care provider to suggest some activities that are safe for you.  Eating a heart-healthy diet. A registered dietitian can help you to learn healthy eating options.  Maintaining a healthy weight.  Managing diabetes, if necessary.  Reducing stress. SEEK MEDICAL CARE IF:  Your chest pain does not go away after treatment.  You have a rash with blisters on your chest.  You have a fever. SEEK IMMEDIATE MEDICAL CARE IF:   Your chest pain is worse.  You have an increasing cough, or you cough up blood.  You have severe abdominal pain.  You have severe weakness.  You faint.  You have chills.  You have sudden, unexplained chest discomfort.  You have sudden, unexplained discomfort in your arms, back, neck, or jaw.  You have shortness of breath at any time.  You suddenly start to sweat, or your skin gets clammy.  You feel nauseous or you vomit.  You suddenly feel light-headed or dizzy.  Your heart begins to beat quickly, or it feels like it is skipping beats. These symptoms may represent a serious problem that is an emergency. Do not wait to  see if the symptoms will go away. Get medical help right away. Call your local emergency services (911 in the [place] Do not drive yourself to the hospital.   This information is not intended to replace advice given to you by your health care provider. Make sure you discuss any questions you have with your health care provider.   Document Released: 09/20/2005 Document Revised: 01/01/2015 Document Reviewed: 07/17/2014 Elsevier Interactive Patient Education [employer].

## 2016-01-19 NOTE — ED Provider Notes (Signed)
CSN: [id number]     Arrival date & time 01/19/16  1440 History   First MD Initiated Contact with Patient 01/19/16 1533     Chief Complaint  Patient presents with  . Chest Pain     (Consider location/radiation/quality/duration/timing/severity/associated sxs/prior Treatment) HPI Comments: Patient awoke around 9:00 with left sided chest pain that is central and radiates to L side and L back. Pain is constant, nothing makes it better or worse. It radiates underneath her left breast. She describes shortness of breath with nausea and lightheadedness. She denies any diaphoresis. The pain is not worse with exertion. There is no focal weakness, numbness or tingling. She denies any cardiac history. She endorses a history of hypertension and anxiety. She denies any history of smoking. No heart or lung problems. She had pain like this several weeks ago but did not have it evaluated. She states her blood pressure was elevated earlier this morning but it is normal now. She took her blood pressure medications today. Denies any headache, focal weakness, numbness, tingling.  Patient is a 50 y.o. female presenting with chest pain. The history is provided by the patient.  Chest Pain Associated symptoms: shortness of breath   Associated symptoms: no abdominal pain, no cough, no dizziness, no fatigue, no headache, no nausea, not vomiting and no weakness     Past Medical History  Diagnosis Date  . Anemia   . Fibroids     uterine  . Abnormal Pap smear   . BV (bacterial vaginosis) 05/16/2013  . Mental disorder     depression and panic attacks  . Anxiety   . Hypertension    Past Surgical History  Procedure Laterality Date  . Tubal ligation    . Dilation and curettage of uterus    . Supracervical abdominal hysterectomy N/A 02/25/2014    Procedure: HYSTERECTOMY SUPRACERVICAL ABDOMINAL;  Surgeon: Florian Buff, MD;  Location: [facility];  Service: Gynecology;  Laterality: N/A;  . Bilateral salpingectomy Bilateral  02/25/2014    Procedure: BILATERAL SALPINGECTOMY;  Surgeon: Florian Buff, MD;  Location: [facility];  Service: Gynecology;  Laterality: Bilateral;  . Abdominal hysterectomy     Family History  Problem Relation Age of Onset  . Congestive Heart Failure Mother   . Congestive Heart Failure Maternal Grandmother   . Cancer Paternal Grandmother     ovarian   Social History  Substance Use Topics  . Smoking status: Never Smoker   . Smokeless tobacco: Never Used  . Alcohol Use: 4.2 oz/week    7 Cans of beer per week     Comment: 1 beer daily    OB History    Gravida Para Term Preterm AB TAB SAB Ectopic Multiple Living   7 5   2  2   5      Review of Systems  Constitutional: Negative for activity change, appetite change and fatigue.  HENT: Negative for congestion and rhinorrhea.   Eyes: Negative for visual disturbance.  Respiratory: Positive for chest tightness and shortness of breath. Negative for cough.   Cardiovascular: Positive for chest pain.  Gastrointestinal: Negative for nausea, vomiting and abdominal pain.  Genitourinary: Negative for dysuria, hematuria, vaginal bleeding and vaginal discharge.  Musculoskeletal: Negative for myalgias and arthralgias.  Skin: Negative for rash.  Neurological: Negative for dizziness, weakness and headaches.  A complete 10 system review of systems was obtained and all systems are negative except as noted in the HPI and PMH.      Allergies  Review  of patient's allergies indicates no known allergies.  Home Medications   Prior to Admission medications   Medication Sig Start Date End Date Taking? Authorizing Provider  diltiazem (CARDIZEM) 30 MG tablet Take 1 prn heart racing Patient taking differently: Take 30 mg by mouth daily as needed (heart racing). Take 1 prn heart racing 05/11/15   Rolland Porter, MD  hydrOXYzine (ATARAX/VISTARIL) 50 MG tablet Take 1 tablet (50 mg total) by mouth every 8 (eight) hours as needed for itching. Patient not taking:  Reported on 01/19/2016 09/15/15   Merryl Hacker, MD  omeprazole (PRILOSEC) 20 MG capsule Take 1 capsule (20 mg total) by mouth daily. 01/19/16   Ezequiel Essex, MD  permethrin (ELIMITE) 5 % cream Thoroughly massage cream from head to soles of feet; leave on for 8 to 14 hours before removing (shower or bath) Patient not taking: Reported on 01/19/2016 09/15/15   Merryl Hacker, MD   BP 142/82 mmHg  Pulse 61  Temp(Src) 98.3 F (36.8 C) (Oral)  Resp 16  Ht 5\' 7"  (1.702 m)  Wt 140 lb (63.504 kg)  BMI 21.92 kg/m2  SpO2 100%  LMP 02/09/2014 Physical Exam  Constitutional: She is oriented to person, place, and time. She appears well-developed and well-nourished. No distress.  HENT:  Head: Normocephalic and atraumatic.  Mouth/Throat: Oropharynx is clear and moist. No oropharyngeal exudate.  Eyes: Conjunctivae and EOM are normal. Pupils are equal, round, and reactive to light.  Neck: Normal range of motion. Neck supple.  No meningismus.  Cardiovascular: Normal rate, regular rhythm, normal heart sounds and intact distal pulses.   No murmur heard. Pulmonary/Chest: Effort normal and breath sounds normal. No respiratory distress. She exhibits no tenderness.  There is no tenderness to palpation along her left side her chest wall. There is no visible rash.  Abdominal: Soft. There is no tenderness. There is no rebound and no guarding.  Musculoskeletal: Normal range of motion. She exhibits no edema or tenderness.  Neurological: She is alert and oriented to person, place, and time. No cranial nerve deficit. She exhibits normal muscle tone. Coordination normal.  No ataxia on finger to nose bilaterally. No pronator drift. 5/5 strength throughout. CN 2-12 intact.Equal grip strength. Sensation intact.   Skin: Skin is warm.  Psychiatric: She has a normal mood and affect. Her behavior is normal.  Nursing note and vitals reviewed.   ED Course  Procedures (including critical care time) Labs Review Labs  Reviewed  BASIC METABOLIC PANEL  CBC  TROPONIN I  D-DIMER, QUANTITATIVE (NOT AT [facility])  TROPONIN I    Imaging Review Dg Chest 2 View  01/19/2016  CLINICAL DATA:  One day history of chest pain EXAM: CHEST  2 VIEW COMPARISON:  February 10, 2015 FINDINGS: Lungs are clear. Heart size and pulmonary vascularity are normal. No pneumothorax. No adenopathy. No bone lesions. IMPRESSION: No edema or consolidation. Electronically Signed   By: Lowella Grip III M.D.   On: 01/19/2016 15:11   I have personally reviewed and evaluated these images and lab results as part of my medical decision-making.   EKG Interpretation   Date/Time:  Wednesday January 19 2016 14:47:28 EST Ventricular Rate:  86 PR Interval:  126 QRS Duration: 72 QT Interval:  394 QTC Calculation: 471 R Axis:   63 Text Interpretation:  Normal sinus rhythm Right atrial enlargement Septal  infarct , age undetermined Abnormal ECG No significant change was found  Confirmed by Wyvonnia Dusky  MD, Tayonna Bacha [phone]) on 01/19/2016 4:00:19 PM  MDM   Final diagnoses:  Atypical chest pain   Ongoing left-sided chest pain since this morning constant since 9 AM. Radiates to left side and left back. There is no rash. No shortness of breath. EKG nonischemic.  Troponin negative. D-dimer negative. Chest x-ray negative. Pain is not reproducible.  Somewhat improved after GI cocktail. Patient with ongoing chest pain since 9 AM approximately 6 hours. No elevation of troponin. Low suspicion for ACS. Doubt PE is negative d-dimer.  Troponin negative 2. We'll start PPI for possible GI etiology. It is also possible this represents shingles without any rash visible. Follow-up with PCP for possible stress test. Return precautions discussed.   Ezequiel Essex, MD 01/20/16 [phone]

## 2016-01-19 NOTE — ED Notes (Signed)
Pt reports mid sternal CP that radiates to LT shoulder, SOB, lightheadedness, and nausea since 0900 this morning. Pt reports elevated BP ([id number] systolic) earlier this morning. Pt reports taking BP medication today.

## 2016-07-06 ENCOUNTER — Encounter (HOSPITAL_COMMUNITY): Payer: Self-pay | Admitting: Cardiology

## 2016-07-06 ENCOUNTER — Emergency Department (HOSPITAL_COMMUNITY)
Admission: EM | Admit: 2016-07-06 | Discharge: 2016-07-06 | Disposition: A | Discharge status: Home / Self Care | Payer: PRIVATE HEALTH INSURANCE | Attending: Emergency Medicine | Admitting: Emergency Medicine

## 2016-07-06 DIAGNOSIS — W57XXXA Bitten or stung by nonvenomous insect and other nonvenomous arthropods, initial encounter: Secondary | ICD-10-CM | POA: Diagnosis not present

## 2016-07-06 DIAGNOSIS — I10 Essential (primary) hypertension: Secondary | ICD-10-CM | POA: Insufficient documentation

## 2016-07-06 DIAGNOSIS — Z791 Long term (current) use of non-steroidal anti-inflammatories (NSAID): Secondary | ICD-10-CM | POA: Insufficient documentation

## 2016-07-06 DIAGNOSIS — Y929 Unspecified place or not applicable: Secondary | ICD-10-CM | POA: Insufficient documentation

## 2016-07-06 DIAGNOSIS — Y939 Activity, unspecified: Secondary | ICD-10-CM | POA: Diagnosis not present

## 2016-07-06 DIAGNOSIS — Z79899 Other long term (current) drug therapy: Secondary | ICD-10-CM | POA: Insufficient documentation

## 2016-07-06 DIAGNOSIS — Y999 Unspecified external cause status: Secondary | ICD-10-CM | POA: Insufficient documentation

## 2016-07-06 DIAGNOSIS — S00261A Insect bite (nonvenomous) of right eyelid and periocular area, initial encounter: Secondary | ICD-10-CM | POA: Insufficient documentation

## 2016-07-06 MED ORDER — CETIRIZINE HCL 10 MG PO TABS: 10.0000 mg | ORAL_TABLET | Freq: Every day | ORAL | Status: DC

## 2016-07-06 MED ORDER — IBUPROFEN 600 MG PO TABS: 600.0000 mg | ORAL_TABLET | Freq: Four times a day (QID) | ORAL | Status: DC | PRN

## 2016-07-06 NOTE — ED Notes (Signed)
Patient with no complaints at this time. Respirations even and unlabored. Skin warm/dry. Discharge instructions reviewed with patient at this time. Patient given opportunity to voice concerns/ask questions. Patient discharged at this time and left Emergency Department with steady gait.   

## 2016-07-06 NOTE — ED Notes (Signed)
Right eye and cheek swelling.  Thinks she was bit by an insect yesterday.

## 2016-07-06 NOTE — Discharge Instructions (Signed)
Your symptoms are consistent with an insect bite.  Avoid rubbing the site as this will make your swelling worse.  Applying cool compresses throughout the day and avoiding lying flat for sleeping will help with swelling.

## 2016-07-06 NOTE — ED Provider Notes (Signed)
CSN: [id number]     Arrival date & time 07/06/16  [id number] History   First MD Initiated Contact with Patient 07/06/16 [phone]     Chief Complaint  Patient presents with  . Facial Swelling     (Consider location/radiation/quality/duration/timing/severity/associated sxs/prior Treatment) The history is provided by the patient.   Vicki [name] is a 50 y.o. female presenting with itching and swelling of her right cheek and upper eyelid.  She endorses was outside yesterday evening on her work break and felt a little itching at these sites prior to bed, but woke today with increased swelling.  She denies pain, drainage, eye foreign body sensation or vision changes.  She has has had no fevers and has had no medicines prior to arrival. She suspects an insect bite but did not visualize the culprit insect.  No other household members with similar sx.     Past Medical History  Diagnosis Date  . Anemia   . Fibroids     uterine  . Abnormal Pap smear   . BV (bacterial vaginosis) 05/16/2013  . Mental disorder     depression and panic attacks  . Anxiety   . Hypertension    Past Surgical History  Procedure Laterality Date  . Tubal ligation    . Dilation and curettage of uterus    . Supracervical abdominal hysterectomy N/A 02/25/2014    Procedure: HYSTERECTOMY SUPRACERVICAL ABDOMINAL;  Surgeon: Florian Buff, MD;  Location: [facility];  Service: Gynecology;  Laterality: N/A;  . Bilateral salpingectomy Bilateral 02/25/2014    Procedure: BILATERAL SALPINGECTOMY;  Surgeon: Florian Buff, MD;  Location: [facility];  Service: Gynecology;  Laterality: Bilateral;  . Abdominal hysterectomy     Family History  Problem Relation Age of Onset  . Congestive Heart Failure Mother   . Congestive Heart Failure Maternal Grandmother   . Cancer Paternal Grandmother     ovarian   Social History  Substance Use Topics  . Smoking status: Never Smoker   . Smokeless tobacco: Never Used  . Alcohol Use: 4.2 oz/week    7 Cans of  beer per week     Comment: 1 beer daily    OB History    Gravida Para Term Preterm AB TAB SAB Ectopic Multiple Living   7 5   2  2   5      Review of Systems  Constitutional: Negative for fever and chills.  Eyes: Positive for itching. Negative for pain, discharge and visual disturbance.  Respiratory: Negative for shortness of breath and wheezing.   Skin: Positive for rash. Negative for color change and wound.  Neurological: Negative for numbness.      Allergies  Review of patient's allergies indicates no known allergies.  Home Medications   Prior to Admission medications   Medication Sig Start Date End Date Taking? Authorizing Provider  cetirizine (ZYRTEC) 10 MG tablet Take 1 tablet (10 mg total) by mouth daily. 07/06/16   Evalee Jefferson, PA-C  diltiazem (CARDIZEM) 30 MG tablet Take 1 prn heart racing Patient taking differently: Take 30 mg by mouth daily as needed (heart racing). Take 1 prn heart racing 05/11/15   Rolland Porter, MD  hydrOXYzine (ATARAX/VISTARIL) 50 MG tablet Take 1 tablet (50 mg total) by mouth every 8 (eight) hours as needed for itching. Patient not taking: Reported on 01/19/2016 09/15/15   Merryl Hacker, MD  ibuprofen (ADVIL,MOTRIN) 600 MG tablet Take 1 tablet (600 mg total) by mouth every 6 (six) hours as needed  for moderate pain (and swelling). 07/06/16   Evalee Jefferson, PA-C  omeprazole (PRILOSEC) 20 MG capsule Take 1 capsule (20 mg total) by mouth daily. 01/19/16   Ezequiel Essex, MD  permethrin (ELIMITE) 5 % cream Thoroughly massage cream from head to soles of feet; leave on for 8 to 14 hours before removing (shower or bath) Patient not taking: Reported on 01/19/2016 09/15/15   Merryl Hacker, MD   BP 147/97 mmHg  Pulse 65  Temp(Src) 98.1 F (36.7 C) (Oral)  Resp 16  Ht 5\' 7"  (1.702 m)  Wt 64.411 kg  BMI 22.24 kg/m2  SpO2 100%  LMP 02/09/2014 Physical Exam  Constitutional: She appears well-developed and well-nourished. No distress.  HENT:  Head:  Normocephalic.  Mouth/Throat: Uvula is midline, oropharynx is clear and moist and mucous membranes are normal.  Eyes: EOM are normal. Pupils are equal, round, and reactive to light. Left eye exhibits no chemosis and no discharge. Left conjunctiva is not injected. No scleral icterus.  Neck: Neck supple.  Cardiovascular: Normal rate.   Pulmonary/Chest: Effort normal. She has no wheezes.  Musculoskeletal: Normal range of motion. She exhibits no edema.  Lymphadenopathy:    She has no cervical adenopathy.  No head or neck adenopathy  Skin: Rash noted.  Quarter sized raised wheal right maxilla with edema of right upper eyelid.  No puncture wounds, erythema, warmth, drainage, no induration.      ED Course  Procedures (including critical care time) Labs Review Labs Reviewed - No data to display  Imaging Review No results found. I have personally reviewed and evaluated these images and lab results as part of my medical decision-making.   EKG Interpretation None      MDM   Final diagnoses:  Insect bite    Zyrtec, ibuprofen, cool compresses, avoid rubbing the site.  Prn f/u if sx persist or worsen.  Suspect insect bite with localized reaction.  No sign of infection at this time.    Evalee Jefferson, PA-C 07/06/16 0915  Isla Pence, MD 07/06/16 1048

## 2016-12-25 DIAGNOSIS — R000 Tachycardia, unspecified: Secondary | ICD-10-CM

## 2016-12-25 HISTORY — DX: Tachycardia, unspecified: R00.0

## 2016-12-26 ENCOUNTER — Emergency Department (HOSPITAL_COMMUNITY)
Admission: EM | Admit: 2016-12-26 | Discharge: 2016-12-26 | Disposition: A | Discharge status: Home / Self Care | Payer: PRIVATE HEALTH INSURANCE | Attending: Emergency Medicine | Admitting: Emergency Medicine

## 2016-12-26 ENCOUNTER — Emergency Department (HOSPITAL_COMMUNITY): Payer: PRIVATE HEALTH INSURANCE

## 2016-12-26 ENCOUNTER — Encounter (HOSPITAL_COMMUNITY): Payer: Self-pay | Admitting: Emergency Medicine

## 2016-12-26 DIAGNOSIS — J069 Acute upper respiratory infection, unspecified: Secondary | ICD-10-CM | POA: Diagnosis not present

## 2016-12-26 DIAGNOSIS — I10 Essential (primary) hypertension: Secondary | ICD-10-CM | POA: Insufficient documentation

## 2016-12-26 DIAGNOSIS — B9789 Other viral agents as the cause of diseases classified elsewhere: Secondary | ICD-10-CM

## 2016-12-26 DIAGNOSIS — R05 Cough: Secondary | ICD-10-CM | POA: Diagnosis present

## 2016-12-26 DIAGNOSIS — Z79899 Other long term (current) drug therapy: Secondary | ICD-10-CM | POA: Diagnosis not present

## 2016-12-26 LAB — RAPID STREP SCREEN (MED CTR MEBANE ONLY): Streptococcus, Group A Screen (Direct): NEGATIVE

## 2016-12-26 MED ORDER — FLUTICASONE PROPIONATE 50 MCG/ACT NA SUSP: 2.0000 | Freq: Every day | NASAL | 0 refills | Status: DC

## 2016-12-26 MED ORDER — BENZONATATE 100 MG PO CAPS: 100.0000 mg | ORAL_CAPSULE | Freq: Three times a day (TID) | ORAL | 0 refills | Status: DC

## 2016-12-26 MED ORDER — IBUPROFEN 400 MG PO TABS: 400.0000 mg | ORAL_TABLET | Freq: Four times a day (QID) | ORAL | 0 refills | Status: DC | PRN

## 2016-12-26 MED ORDER — IBUPROFEN 400 MG PO TABS
400.0000 mg | ORAL_TABLET | Freq: Once | ORAL | Status: AC
  Administered 2016-12-26: 400 mg via ORAL
  Filled 2016-12-26: qty 1

## 2016-12-26 NOTE — ED Triage Notes (Signed)
Cough, congestion started today

## 2016-12-26 NOTE — ED Provider Notes (Signed)
[place] DEPT Provider Note   CSN: [id number] Arrival date & time: 12/26/16  [id number]     History   Chief Complaint Chief Complaint  Patient presents with  . Cough    HPI Vicki [name] is a 51 y.o. female.  HPI  This is a 52 year old female who presents with cough and sore throat. Patient reports one-day history of nonproductive cough. Also reports sore throat. Reports nasal congestion. Denies fevers. Denies nausea, vomiting, diarrhea. Denies shortness of breath. Reports that she feels like she has "increased chest congestion." She is taking Tylenol home with minimal relief.  Past Medical History:  Diagnosis Date  . Abnormal Pap smear   . Anemia   . Anxiety   . BV (bacterial vaginosis) 05/16/2013  . Fibroids    uterine  . Hypertension   . Mental disorder    depression and panic attacks    Patient Active Problem List   Diagnosis Date Noted  . S/P abdominal hysterectomy 02/25/2014  . Depression 07/25/2013  . Panic attacks 07/25/2013  . Fibroids 05/16/2013  . BV (bacterial vaginosis) 05/16/2013  . Anemia 04/03/2012  . Menorrhagia 04/03/2012  . Hypokalemia 04/03/2012  . UTI (lower urinary tract infection) 04/03/2012  . Emesis 04/03/2012    Past Surgical History:  Procedure Laterality Date  . ABDOMINAL HYSTERECTOMY    . BILATERAL SALPINGECTOMY Bilateral 02/25/2014   Procedure: BILATERAL SALPINGECTOMY;  Surgeon: Florian Buff, MD;  Location: [facility];  Service: Gynecology;  Laterality: Bilateral;  . DILATION AND CURETTAGE OF UTERUS    . SUPRACERVICAL ABDOMINAL HYSTERECTOMY N/A 02/25/2014   Procedure: HYSTERECTOMY SUPRACERVICAL ABDOMINAL;  Surgeon: Florian Buff, MD;  Location: [facility];  Service: Gynecology;  Laterality: N/A;  . TUBAL LIGATION      OB History    Gravida Para Term Preterm AB Living   7 5     2 5    SAB TAB Ectopic Multiple Live Births   2               Home Medications    Prior to Admission medications   Medication Sig Start Date End Date  Taking? Authorizing Provider  benzonatate (TESSALON) 100 MG capsule Take 1 capsule (100 mg total) by mouth every 8 (eight) hours. 12/26/16   Merryl Hacker, MD  cetirizine (ZYRTEC) 10 MG tablet Take 1 tablet (10 mg total) by mouth daily. 07/06/16   Evalee Jefferson, PA-C  diltiazem (CARDIZEM) 30 MG tablet Take 1 prn heart racing Patient taking differently: Take 30 mg by mouth daily as needed (heart racing). Take 1 prn heart racing 05/11/15   Rolland Porter, MD  fluticasone (FLONASE) 50 MCG/ACT nasal spray Place 2 sprays into both nostrils daily. 12/26/16   Merryl Hacker, MD  hydrOXYzine (ATARAX/VISTARIL) 50 MG tablet Take 1 tablet (50 mg total) by mouth every 8 (eight) hours as needed for itching. Patient not taking: Reported on 01/19/2016 09/15/15   Merryl Hacker, MD  ibuprofen (ADVIL,MOTRIN) 400 MG tablet Take 1 tablet (400 mg total) by mouth every 6 (six) hours as needed. 12/26/16   Merryl Hacker, MD  omeprazole (PRILOSEC) 20 MG capsule Take 1 capsule (20 mg total) by mouth daily. 01/19/16   Ezequiel Essex, MD  permethrin (ELIMITE) 5 % cream Thoroughly massage cream from head to soles of feet; leave on for 8 to 14 hours before removing (shower or bath) Patient not taking: Reported on 01/19/2016 09/15/15   Merryl Hacker, MD    Family History Family  History  Problem Relation Age of Onset  . Congestive Heart Failure Mother   . Congestive Heart Failure Maternal Grandmother   . Cancer Paternal Grandmother     ovarian    Social History Social History  Substance Use Topics  . Smoking status: Never Smoker  . Smokeless tobacco: Never Used  . Alcohol use 4.2 oz/week    7 Cans of beer per week     Comment: 1 beer daily      Allergies   Patient has no known allergies.   Review of Systems Review of Systems  Constitutional: Negative for chills and fever.  HENT: Positive for congestion and sore throat.   Respiratory: Positive for cough. Negative for shortness of breath and wheezing.     Cardiovascular: Negative for chest pain.  Gastrointestinal: Negative for abdominal pain, diarrhea, nausea and vomiting.  Genitourinary: Negative for dysuria.  All other systems reviewed and are negative.    Physical Exam Updated Vital Signs BP 167/90 (BP Location: Right Arm)   Pulse 86   Temp 98.2 F (36.8 C) (Oral)   Resp 18   Ht 5\' 7"  (1.702 m)   Wt 145 lb (65.8 kg)   LMP 02/09/2014   SpO2 100%   BMI 22.71 kg/m   Physical Exam  Constitutional: She is oriented to person, place, and time. No distress.  HENT:  Head: Normocephalic and atraumatic.  Mild erythema posterior oropharynx, no exudate noted, no palatal petechiae, uvula midline  Eyes: Pupils are equal, round, and reactive to light.  Neck: Neck supple.  Cardiovascular: Normal rate, regular rhythm and normal heart sounds.   No murmur heard. Pulmonary/Chest: Effort normal and breath sounds normal. No respiratory distress. She has no wheezes.  Abdominal: Soft. Bowel sounds are normal. There is no tenderness. There is no guarding.  Lymphadenopathy:    She has no cervical adenopathy.  Neurological: She is alert and oriented to person, place, and time.  Skin: Skin is warm and dry.  Psychiatric: She has a normal mood and affect.  Nursing note and vitals reviewed.    ED Treatments / Results  Labs (all labs ordered are listed, but only abnormal results are displayed) Labs Reviewed  RAPID STREP SCREEN (NOT AT [facility])  CULTURE, GROUP A STREP [facility])    EKG  EKG Interpretation None       Radiology Dg Chest 2 View  Result Date: 12/26/2016 CLINICAL DATA:  Cough, congestion for a few days. EXAM: CHEST  2 VIEW COMPARISON:  Chest radiograph January 19, 2016 FINDINGS: Cardiomediastinal silhouette is normal. No pleural effusions or focal consolidations. Trachea projects midline and there is no pneumothorax. Soft tissue planes and included osseous structures are non-suspicious. IMPRESSION: No active cardiopulmonary disease.  Electronically Signed   By: Elon Alas M.D.   On: 12/26/2016 04:56    Procedures Procedures (including critical care time)  Medications Ordered in ED Medications  ibuprofen (ADVIL,MOTRIN) tablet 400 mg (400 mg Oral Given 12/26/16 0424)     Initial Impression / Assessment and Plan / ED Course  I have reviewed the triage vital signs and the nursing notes.  Pertinent labs & imaging results that were available during my care of the patient were reviewed by me and considered in my medical decision making (see chart for details).  Clinical Course     Patient presents with upper respiratory symptoms 1 day. Nontoxic-appearing. Vital signs reassuring. Afebrile. Strep screen negative. X-ray without evidence of pneumonia. Patient likely with viral upper respiratory symptoms. Doubt flu  given patient is afebrile and otherwise well-appearing. Supportive measures including Flonase, Tessalon Perles, and ibuprofen.  After history, exam, and medical workup I feel the patient has been appropriately medically screened and is safe for discharge home. Pertinent diagnoses were discussed with the patient. Patient was given return precautions.   Final Clinical Impressions(s) / ED Diagnoses   Final diagnoses:  Viral URI with cough    New Prescriptions New Prescriptions   BENZONATATE (TESSALON) 100 MG CAPSULE    Take 1 capsule (100 mg total) by mouth every 8 (eight) hours.   FLUTICASONE (FLONASE) 50 MCG/ACT NASAL SPRAY    Place 2 sprays into both nostrils daily.   IBUPROFEN (ADVIL,MOTRIN) 400 MG TABLET    Take 1 tablet (400 mg total) by mouth every 6 (six) hours as needed.     Merryl Hacker, MD 12/26/16 [phone]

## 2016-12-27 ENCOUNTER — Encounter (HOSPITAL_COMMUNITY): Payer: Self-pay | Admitting: Emergency Medicine

## 2016-12-27 ENCOUNTER — Emergency Department (HOSPITAL_COMMUNITY)
Admission: EM | Admit: 2016-12-27 | Discharge: 2016-12-27 | Disposition: A | Discharge status: Home / Self Care | Payer: PRIVATE HEALTH INSURANCE | Attending: Emergency Medicine | Admitting: Emergency Medicine

## 2016-12-27 DIAGNOSIS — J069 Acute upper respiratory infection, unspecified: Secondary | ICD-10-CM | POA: Diagnosis not present

## 2016-12-27 DIAGNOSIS — Z79899 Other long term (current) drug therapy: Secondary | ICD-10-CM | POA: Diagnosis not present

## 2016-12-27 DIAGNOSIS — R05 Cough: Secondary | ICD-10-CM | POA: Diagnosis present

## 2016-12-27 DIAGNOSIS — B9789 Other viral agents as the cause of diseases classified elsewhere: Secondary | ICD-10-CM

## 2016-12-27 DIAGNOSIS — Z791 Long term (current) use of non-steroidal anti-inflammatories (NSAID): Secondary | ICD-10-CM | POA: Insufficient documentation

## 2016-12-27 DIAGNOSIS — I10 Essential (primary) hypertension: Secondary | ICD-10-CM | POA: Insufficient documentation

## 2016-12-27 NOTE — ED Provider Notes (Signed)
[facility] DEPT Provider Note   CSN: [id number] Arrival date & time: 12/27/16  1002     History   Chief Complaint Chief Complaint  Patient presents with  . Cough    HPI Vicki [name] is a 51 y.o. female.  HPI   51 year old female presents complaining of cough and sore throat. Patient was seen in the ED yesterday for complaints of a one day history of nonproductive cough and sore throat along with nasal congestion. No associated fever nausea vomiting diarrhea or shortness of breath. She has a negative strep screen, and her x-ray shows no signs of pneumonia. She was afebrile. She was diagnosed with a viral respiratory infection and was discharge with Flonase, Tessalon Perles, and ibuprofen. Patient returned today requesting to be out of work for another day. 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 Symptoms remained the same. No other complaint.  Past Medical History:  Diagnosis Date  . Abnormal Pap smear   . Anemia   . Anxiety   . BV (bacterial vaginosis) 05/16/2013  . Fibroids    uterine  . Hypertension   . Mental disorder    depression and panic attacks    Patient Active Problem List   Diagnosis Date Noted  . S/P abdominal hysterectomy 02/25/2014  . Depression 07/25/2013  . Panic attacks 07/25/2013  . Fibroids 05/16/2013  . BV (bacterial vaginosis) 05/16/2013  . Anemia 04/03/2012  . Menorrhagia 04/03/2012  . Hypokalemia 04/03/2012  . UTI (lower urinary tract infection) 04/03/2012  . Emesis 04/03/2012    Past Surgical History:  Procedure Laterality Date  . ABDOMINAL HYSTERECTOMY    . BILATERAL SALPINGECTOMY Bilateral 02/25/2014   Procedure: BILATERAL SALPINGECTOMY;  Surgeon: Florian Buff, MD;  Location: [facility];  Service: Gynecology;  Laterality: Bilateral;  . DILATION AND CURETTAGE OF UTERUS    . SUPRACERVICAL ABDOMINAL HYSTERECTOMY N/A 02/25/2014   Procedure: HYSTERECTOMY SUPRACERVICAL  ABDOMINAL;  Surgeon: Florian Buff, MD;  Location: [facility];  Service: Gynecology;  Laterality: N/A;  . TUBAL LIGATION      OB History    Gravida Para Term Preterm AB Living   7 5     2 5    SAB TAB Ectopic Multiple Live Births   2               Home Medications    Prior to Admission medications   Medication Sig Start Date End Date Taking? Authorizing Provider  benzonatate (TESSALON) 100 MG capsule Take 1 capsule (100 mg total) by mouth every 8 (eight) hours. 12/26/16   Merryl Hacker, MD  cetirizine (ZYRTEC) 10 MG tablet Take 1 tablet (10 mg total) by mouth daily. 07/06/16   Evalee Jefferson, PA-C  diltiazem (CARDIZEM) 30 MG tablet Take 1 prn heart racing Patient taking differently: Take 30 mg by mouth daily as needed (heart racing). Take 1 prn heart racing 05/11/15   Rolland Porter, MD  fluticasone (FLONASE) 50 MCG/ACT nasal spray Place 2 sprays into both nostrils daily. 12/26/16   Merryl Hacker, MD  hydrOXYzine (ATARAX/VISTARIL) 50 MG tablet Take 1 tablet (50 mg total) by mouth every 8 (eight) hours as needed for itching. Patient not taking: Reported on 01/19/2016 09/15/15   Merryl Hacker, MD  ibuprofen (ADVIL,MOTRIN) 400 MG tablet Take 1 tablet (400 mg total) by mouth every 6 (six) hours as needed. 12/26/16   Merryl Hacker, MD  omeprazole ([place]) 20  MG capsule Take 1 capsule (20 mg total) by mouth daily. 01/19/16   Ezequiel Essex, MD  permethrin (ELIMITE) 5 % cream Thoroughly massage cream from head to soles of feet; leave on for 8 to 14 hours before removing (shower or bath) Patient not taking: Reported on 01/19/2016 09/15/15   Merryl Hacker, MD    Family History Family History  Problem Relation Age of Onset  . Congestive Heart Failure Mother   . Congestive Heart Failure Maternal Grandmother   . Cancer Paternal Grandmother     ovarian    Social History Social History  Substance Use Topics  . Smoking status: Never Smoker  . Smokeless tobacco: Never Used  . Alcohol use 4.2  oz/week    7 Cans of beer per week     Comment: 1 beer daily      Allergies   Patient has no known allergies.   Review of Systems Review of Systems  All other systems reviewed and are negative.    Physical Exam Updated Vital Signs LMP 02/09/2014   Physical Exam  Constitutional: She appears well-developed and well-nourished. No distress.  HENT:  Head: Atraumatic.  Ears: Normal TM Nose: Mild rhinorrhea Throat: Mild posterior oropharyngeal erythema without trismus and no tonsillar enlargement or exudates   Eyes: Conjunctivae are normal.  Neck: Neck supple.  Cardiovascular: Normal rate and regular rhythm.   Pulmonary/Chest: Effort normal and breath sounds normal.  Neurological: She is alert.  Skin: No rash noted.  Psychiatric: She has a normal mood and affect.  Nursing note and vitals reviewed.    ED Treatments / Results  Labs (all labs ordered are listed, but only abnormal results are displayed) Labs Reviewed - No data to display  EKG  EKG Interpretation None       Radiology Dg Chest 2 View  Result Date: 12/26/2016 CLINICAL DATA:  Cough, congestion for a few days. EXAM: CHEST  2 VIEW COMPARISON:  Chest radiograph January 19, 2016 FINDINGS: Cardiomediastinal silhouette is normal. No pleural effusions or focal consolidations. Trachea projects midline and there is no pneumothorax. Soft tissue planes and included osseous structures are non-suspicious. IMPRESSION: No active cardiopulmonary disease. Electronically Signed   By: Elon Alas M.D.   On: 12/26/2016 04:56    Procedures Procedures (including critical care time)  Medications Ordered in ED Medications - No data to display   Initial Impression / Assessment and Plan / ED Course  I have reviewed the triage vital signs and the nursing notes.  Pertinent labs & imaging results that were available during my care of the patient were reviewed by me and considered in my medical decision making (see chart  for details).  Clinical Course     BP 155/100 (BP Location: Left Arm)   Pulse 97   Temp 98 F (36.7 C) (Oral)   Resp 20   Ht 5\' 7"  (1.702 m) Comment: Simultaneous filing. User may not have seen previous data.  Wt 65.8 kg Comment: Simultaneous filing. User may not have seen previous data.  LMP 02/09/2014   SpO2 100%   BMI 22.71 kg/m    Final Clinical Impressions(s) / ED Diagnoses   Final diagnoses:  Viral URI with cough    New Prescriptions New Prescriptions   No medications on file   10:19 AM Pt work in a nursing facility.  She has a viral infection and requesting a work note to be extend until tomorrow to decrease risk of spreading her infection to other residents. Work  note provided as requested.   Domenic Moras, PA-C 12/27/16 [place], MD 12/30/16 [phone]

## 2016-12-27 NOTE — ED Triage Notes (Signed)
PT c/o cough, nasal congestion, sore throat continued x3 days and states no relief from the medications that were prescribed in the ED yesterday. PT denies any new complaints today.

## 2016-12-28 LAB — CULTURE, GROUP A STREP (THRC)

## 2017-03-29 ENCOUNTER — Encounter (HOSPITAL_COMMUNITY): Payer: Self-pay | Admitting: Emergency Medicine

## 2017-03-29 ENCOUNTER — Emergency Department (HOSPITAL_COMMUNITY)
Admission: EM | Admit: 2017-03-29 | Discharge: 2017-03-29 | Disposition: A | Discharge status: Home / Self Care | Payer: PRIVATE HEALTH INSURANCE | Attending: Emergency Medicine | Admitting: Emergency Medicine

## 2017-03-29 ENCOUNTER — Emergency Department (HOSPITAL_COMMUNITY): Payer: PRIVATE HEALTH INSURANCE

## 2017-03-29 DIAGNOSIS — R51 Headache: Secondary | ICD-10-CM | POA: Diagnosis not present

## 2017-03-29 DIAGNOSIS — I10 Essential (primary) hypertension: Secondary | ICD-10-CM | POA: Diagnosis not present

## 2017-03-29 DIAGNOSIS — R519 Headache, unspecified: Secondary | ICD-10-CM

## 2017-03-29 DIAGNOSIS — R0789 Other chest pain: Secondary | ICD-10-CM | POA: Diagnosis present

## 2017-03-29 LAB — COMPREHENSIVE METABOLIC PANEL
ALT: 15 U/L (ref 14–54)
AST: 21 U/L (ref 15–41)
Albumin: 4.1 g/dL (ref 3.5–5.0)
Alkaline Phosphatase: 69 U/L (ref 38–126)
Anion gap: 8 (ref 5–15)
BUN: 10 mg/dL (ref 6–20)
CO2: 26 mmol/L (ref 22–32)
Calcium: 9.4 mg/dL (ref 8.9–10.3)
Chloride: 105 mmol/L (ref 101–111)
Creatinine, Ser: 0.88 mg/dL (ref 0.44–1.00)
GFR calc Af Amer: >60 mL/min (ref >60)
GFR calc non Af Amer: >60 mL/min (ref >60)
Glucose, Bld: 87 mg/dL (ref 65–99)
Potassium: 3.7 mmol/L (ref 3.5–5.1)
Sodium: 139 mmol/L (ref 135–145)
Total Bilirubin: 0.5 mg/dL (ref 0.3–1.2)
Total Protein: 7.6 g/dL (ref 6.5–8.1)

## 2017-03-29 LAB — CBC WITH DIFFERENTIAL/PLATELET
Basophils Absolute: 0 10*3/uL (ref 0.0–0.1)
Basophils Relative: 0 %
Eosinophils Absolute: 0.2 10*3/uL (ref 0.0–0.7)
Eosinophils Relative: 3 %
HCT: 42.1 % (ref 36.0–46.0)
Hemoglobin: 14.4 g/dL (ref 12.0–15.0)
Lymphocytes Relative: 35 %
Lymphs Abs: 1.9 10*3/uL (ref 0.7–4.0)
MCH: 29.5 pg (ref 26.0–34.0)
MCHC: 34.2 g/dL (ref 30.0–36.0)
MCV: 86.3 fL (ref 78.0–100.0)
Monocytes Absolute: 0.4 10*3/uL (ref 0.1–1.0)
Monocytes Relative: 7 %
Neutro Abs: 3 10*3/uL (ref 1.7–7.7)
Neutrophils Relative %: 55 %
Platelets: 225 10*3/uL (ref 150–400)
RBC: 4.88 MIL/uL (ref 3.87–5.11)
RDW: 15 % (ref 11.5–15.5)
WBC: 5.4 10*3/uL (ref 4.0–10.5)

## 2017-03-29 LAB — TROPONIN I
Troponin I: <0.03 ng/mL (ref <0.03)
Troponin I: <0.03 ng/mL (ref <0.03)

## 2017-03-29 MED ORDER — ACETAMINOPHEN 325 MG PO TABS
650.0000 mg | ORAL_TABLET | Freq: Once | ORAL | Status: AC
  Administered 2017-03-29: 650 mg via ORAL
  Filled 2017-03-29: qty 2

## 2017-03-29 MED ORDER — DIPHENHYDRAMINE HCL 25 MG PO CAPS
50.0000 mg | ORAL_CAPSULE | Freq: Once | ORAL | Status: DC
  Filled 2017-03-29: qty 2

## 2017-03-29 MED ORDER — METOCLOPRAMIDE HCL 10 MG PO TABS: 10.0000 mg | ORAL_TABLET | Freq: Four times a day (QID) | ORAL | 0 refills | Status: DC | PRN

## 2017-03-29 MED ORDER — KETOROLAC TROMETHAMINE 30 MG/ML IJ SOLN
30.0000 mg | Freq: Once | INTRAMUSCULAR | Status: AC
  Administered 2017-03-29: 30 mg via INTRAMUSCULAR
  Filled 2017-03-29: qty 1

## 2017-03-29 MED ORDER — METOCLOPRAMIDE HCL 5 MG/ML IJ SOLN
10.0000 mg | Freq: Once | INTRAMUSCULAR | Status: AC
  Administered 2017-03-29: 10 mg via INTRAMUSCULAR
  Filled 2017-03-29: qty 2

## 2017-03-29 NOTE — ED Provider Notes (Signed)
[facility] DEPT Provider Note   CSN: [id number] Arrival date & time: 03/29/17  1631     History   Chief Complaint Chief Complaint  Patient presents with  . Chest Pain    HPI Vicki [name] is a 51 y.o. female.  HPI  Pt was seen at 1745. Per pt, c/o gradual onset and persistence of constant chest "pain" since she woke up this morning at 0630. Pt describes the CP as located in her left chest, constant, and "aching." Pt also states she also developed a posterior headache, described as "throbbing," which has continued throughout the day. Pt states she took her BP and "it was high." Pt took her BP meds but did not go to work. Pt states she was going to see her PMD, but she came to the ED instead. Denies taking any other medications. Denies headache was sudden or maximal in onset or at any time. Denies palpitations, no SOB/cough, no abd pain, no N/V/D, no back pain, no neck pain, no injury, no visual changes, no focal motor weakness, no tingling/numbness in extremities, no facial droop, no ataxia, no syncope/near syncope.   Past Medical History:  Diagnosis Date  . Abnormal Pap smear   . Anemia   . Anxiety   . BV (bacterial vaginosis) 05/16/2013  . Fibroids    uterine  . Hypertension   . Mental disorder    depression and panic attacks    Patient Active Problem List   Diagnosis Date Noted  . S/P abdominal hysterectomy 02/25/2014  . Depression 07/25/2013  . Panic attacks 07/25/2013  . Fibroids 05/16/2013  . BV (bacterial vaginosis) 05/16/2013  . Anemia 04/03/2012  . Menorrhagia 04/03/2012  . Hypokalemia 04/03/2012  . UTI (lower urinary tract infection) 04/03/2012  . Emesis 04/03/2012    Past Surgical History:  Procedure Laterality Date  . ABDOMINAL HYSTERECTOMY    . BILATERAL SALPINGECTOMY Bilateral 02/25/2014   Procedure: BILATERAL SALPINGECTOMY;  Surgeon: Florian Buff, MD;  Location: [facility];  Service: Gynecology;  Laterality: Bilateral;  . DILATION AND CURETTAGE OF  UTERUS    . SUPRACERVICAL ABDOMINAL HYSTERECTOMY N/A 02/25/2014   Procedure: HYSTERECTOMY SUPRACERVICAL ABDOMINAL;  Surgeon: Florian Buff, MD;  Location: [facility];  Service: Gynecology;  Laterality: N/A;  . TUBAL LIGATION      OB History    Gravida Para Term Preterm AB Living   7 5     2 5    SAB TAB Ectopic Multiple Live Births   2               Home Medications    Prior to Admission medications   Medication Sig Start Date End Date Taking? Authorizing Provider  benzonatate (TESSALON) 100 MG capsule Take 1 capsule (100 mg total) by mouth every 8 (eight) hours. 12/26/16   Merryl Hacker, MD  cetirizine (ZYRTEC) 10 MG tablet Take 1 tablet (10 mg total) by mouth daily. 07/06/16   Evalee Jefferson, PA-C  diltiazem (CARDIZEM) 30 MG tablet Take 1 prn heart racing Patient taking differently: Take 30 mg by mouth daily as needed (heart racing). Take 1 prn heart racing 05/11/15   Rolland Porter, MD  fluticasone (FLONASE) 50 MCG/ACT nasal spray Place 2 sprays into both nostrils daily. 12/26/16   Merryl Hacker, MD  hydrOXYzine (ATARAX/VISTARIL) 50 MG tablet Take 1 tablet (50 mg total) by mouth every 8 (eight) hours as needed for itching. Patient not taking: Reported on 01/19/2016 09/15/15   Merryl Hacker, MD  ibuprofen (ADVIL,MOTRIN) 400 MG tablet Take 1 tablet (400 mg total) by mouth every 6 (six) hours as needed. 12/26/16   Merryl Hacker, MD  omeprazole (PRILOSEC) 20 MG capsule Take 1 capsule (20 mg total) by mouth daily. 01/19/16   Ezequiel Essex, MD  permethrin (ELIMITE) 5 % cream Thoroughly massage cream from head to soles of feet; leave on for 8 to 14 hours before removing (shower or bath) Patient not taking: Reported on 01/19/2016 09/15/15   Merryl Hacker, MD    Family History Family History  Problem Relation Age of Onset  . Congestive Heart Failure Mother   . Congestive Heart Failure Maternal Grandmother   . Cancer Paternal Grandmother     ovarian    Social History Social History    Substance Use Topics  . Smoking status: Never Smoker  . Smokeless tobacco: Never Used  . Alcohol use 4.2 oz/week    7 Cans of beer per week     Comment: 1 beer daily      Allergies   Patient has no known allergies.   Review of Systems Review of Systems ROS: Statement: All systems negative except as marked or noted in the HPI; Constitutional: Negative for fever and chills. ; ; Eyes: Negative for eye pain, redness and discharge. ; ; ENMT: Negative for ear pain, hoarseness, nasal congestion, sinus pressure and sore throat. ; ; Cardiovascular: +CP. Negative for palpitations, diaphoresis, dyspnea and peripheral edema. ; ; Respiratory: Negative for cough, wheezing and stridor. ; ; Gastrointestinal: Negative for nausea, vomiting, diarrhea, abdominal pain, blood in stool, hematemesis, jaundice and rectal bleeding. . ; ; Genitourinary: Negative for dysuria, flank pain and hematuria. ; ; Musculoskeletal: Negative for back pain and neck pain. Negative for swelling and trauma.; ; Skin: Negative for pruritus, rash, abrasions, blisters, bruising and skin lesion.; ; Neuro: +headache. Negative for lightheadedness and neck stiffness. Negative for weakness, altered level of consciousness, altered mental status, extremity weakness, paresthesias, involuntary movement, seizure and syncope.       Physical Exam Updated Vital Signs BP (!) 158/81   Pulse 60   Temp 97.9 F (36.6 C) (Oral)   Resp 18   Ht 5\' 7"  (1.702 m)   Wt 145 lb (65.8 kg)   LMP 02/09/2014   SpO2 100%   BMI 22.71 kg/m    BP 139/76   Pulse (!) 59   Temp 97.9 F (36.6 C) (Oral)   Resp 14   Ht 5\' 7"  (1.702 m)   Wt 145 lb (65.8 kg)   LMP 02/09/2014   SpO2 100%   BMI 22.71 kg/m    Physical Exam 1750: Physical examination:  Nursing notes reviewed; Vital signs and O2 SAT reviewed;  Constitutional: Well developed, Well nourished, Well hydrated, In no acute distress; Head:  Normocephalic, atraumatic; Eyes: EOMI, PERRL, No scleral  icterus; ENMT: Mouth and pharynx normal, Mucous membranes moist; Neck: Supple, Full range of motion, No lymphadenopathy. No meningeal signs.; Cardiovascular: Regular rate and rhythm, No gallop; Respiratory: Breath sounds clear & equal bilaterally, No wheezes.  Speaking full sentences with ease, Normal respiratory effort/excursion; Chest: Nontender, Movement normal; Abdomen: Soft, Nontender, Nondistended, Normal bowel sounds; Genitourinary: No CVA tenderness; Spine:  No midline CS, TS, LS tenderness. +TTP left cervical paraspinal muscles.;; Extremities: Pulses normal, No tenderness, No edema, No calf edema or asymmetry.; Neuro: AA&Ox3, Major CN grossly intact. No facial droop. Speech clear. No gross focal motor or sensory deficits in extremities. Climbs on and off stretcher easily by  herself. Gait steady; Skin: Color normal, Warm, Dry.   ED Treatments / Results  Labs (all labs ordered are listed, but only abnormal results are displayed)   EKG  EKG Interpretation  Date/Time:  Thursday March 29 2017 16:35:58 EDT Ventricular Rate:  61 PR Interval:  132 QRS Duration: 70 QT Interval:  420 QTC Calculation: 422 R Axis:   54 Text Interpretation:  Normal sinus rhythm Possible Left atrial enlargement Cannot rule out Anterior infarct , age undetermined Baseline wander When compared with ECG of 01/19/2016 No significant change was found Confirmed by [facility]  MD, Nunzio Cory [id number]) on 03/29/2017 5:52:21 PM       Radiology   Procedures Procedures (including critical care time)  Medications Ordered in ED Medications  metoCLOPramide (REGLAN) injection 10 mg (not administered)  diphenhydrAMINE (BENADRYL) capsule 50 mg (not administered)  ketorolac (TORADOL) 30 MG/ML injection 30 mg (not administered)     Initial Impression / Assessment and Plan / ED Course  I have reviewed the triage vital signs and the nursing notes.  Pertinent labs & imaging results that were available during my care of the  patient were reviewed by me and considered in my medical decision making (see chart for details).  MDM Reviewed: previous chart, nursing note and vitals Reviewed previous: labs and ECG Interpretation: labs, ECG, x-ray and CT scan   Results for orders placed or performed during the hospital encounter of 03/29/17  CBC with Differential  Result Value Ref Range   WBC 5.4 4.0 - 10.5 K/uL   RBC 4.88 3.87 - 5.11 MIL/uL   Hemoglobin 14.4 12.0 - 15.0 g/dL   HCT 42.1 36.0 - 46.0 %   MCV 86.3 78.0 - 100.0 fL   MCH 29.5 26.0 - 34.0 pg   MCHC 34.2 30.0 - 36.0 g/dL   RDW 15.0 11.5 - 15.5 %   Platelets 225 150 - 400 K/uL   Neutrophils Relative % 55 %   Neutro Abs 3.0 1.7 - 7.7 K/uL   Lymphocytes Relative 35 %   Lymphs Abs 1.9 0.7 - 4.0 K/uL   Monocytes Relative 7 %   Monocytes Absolute 0.4 0.1 - 1.0 K/uL   Eosinophils Relative 3 %   Eosinophils Absolute 0.2 0.0 - 0.7 K/uL   Basophils Relative 0 %   Basophils Absolute 0.0 0.0 - 0.1 K/uL  Comprehensive metabolic panel  Result Value Ref Range   Sodium 139 135 - 145 mmol/L   Potassium 3.7 3.5 - 5.1 mmol/L   Chloride 105 101 - 111 mmol/L   CO2 26 22 - 32 mmol/L   Glucose, Bld 87 65 - 99 mg/dL   BUN 10 6 - 20 mg/dL   Creatinine, Ser 0.88 0.44 - 1.00 mg/dL   Calcium 9.4 8.9 - 10.3 mg/dL   Total Protein 7.6 6.5 - 8.1 g/dL   Albumin 4.1 3.5 - 5.0 g/dL   AST 21 15 - 41 U/L   ALT 15 14 - 54 U/L   Alkaline Phosphatase 69 38 - 126 U/L   Total Bilirubin 0.5 0.3 - 1.2 mg/dL   GFR calc non Af Amer >60 >60 mL/min   GFR calc Af Amer >60 >60 mL/min   Anion gap 8 5 - 15  Troponin I  Result Value Ref Range   Troponin I <0.03 <0.03 ng/mL  Troponin I  Result Value Ref Range   Troponin I <0.03 <0.03 ng/mL   Dg Chest 2 View Result Date: 03/29/2017 CLINICAL DATA:  Chest pain, elevated  blood pressure, blurred vision EXAM: CHEST  2 VIEW COMPARISON:  Chest x-ray of 12/26/2016 FINDINGS: No active infiltrate or effusion is seen. Mediastinal and hilar  contours are unremarkable. The heart is within normal limits in size. No bony abnormality is seen. IMPRESSION: No active cardiopulmonary disease. Electronically Signed   By: Ivar Drape M.D.   On: 03/29/2017 16:58   Ct Head Wo Contrast Result Date: 03/29/2017 CLINICAL DATA:  Severe headache beginning this morning. EXAM: CT HEAD WITHOUT CONTRAST TECHNIQUE: Contiguous axial images were obtained from the base of the skull through the vertex without intravenous contrast. COMPARISON:  None. FINDINGS: Brain: No evidence of acute infarction, hemorrhage, hydrocephalus, extra-axial collection or mass lesion/mass effect. Vascular: No hyperdense vessel or unexpected calcification. Skull: Normal. Negative for fracture or focal lesion. Sinuses/Orbits: No acute finding. Other: None. IMPRESSION: Negative noncontrast head CT. Electronically Signed   By: Earle Gell M.D.   On: 03/29/2017 18:42    2030:  Doubt PE as cause for symptoms with low risk [place].  Doubt ACS as cause for symptoms with normal troponin x2 and unchanged EKG from previous after 10+ hours of constant symptoms. Pt states she feels better after meds and wants to go home now. Continue to tx symptomatically at this time. Dx and testing d/w pt.  Questions answered.  Verb understanding, agreeable to d/c home with outpt f/u.    Final Clinical Impressions(s) / ED Diagnoses   Final diagnoses:  None    New Prescriptions New Prescriptions   No medications on file     Francine Graven, DO 04/01/17 0000

## 2017-03-29 NOTE — ED Triage Notes (Signed)
PT c/o left sided chest pressure that started this am with some SOB.

## 2017-03-29 NOTE — Discharge Instructions (Signed)
Apply moist heat or ice to the area(s) of discomfort, for 15 minutes at a time, several times per day for the next few days.  Do not fall asleep on a heating or ice pack. Take over the counter tylenol, ibuprofen and benadryl, as directed on packaging, with the prescription given to you today, as needed for headache.  Keep a headache diary.  Call your regular medical doctor tomorrow to schedule a follow up appointment within the next 2 days.  Return to the Emergency Department immediately sooner if worsening.

## 2018-03-13 ENCOUNTER — Encounter (HOSPITAL_COMMUNITY): Payer: Self-pay | Admitting: Cardiology

## 2018-03-13 ENCOUNTER — Emergency Department (HOSPITAL_COMMUNITY)
Admission: EM | Admit: 2018-03-13 | Discharge: 2018-03-13 | Disposition: A | Discharge status: Home / Self Care | Payer: PRIVATE HEALTH INSURANCE | Attending: Emergency Medicine | Admitting: Emergency Medicine

## 2018-03-13 DIAGNOSIS — I471 Supraventricular tachycardia: Secondary | ICD-10-CM | POA: Insufficient documentation

## 2018-03-13 DIAGNOSIS — Z79899 Other long term (current) drug therapy: Secondary | ICD-10-CM | POA: Insufficient documentation

## 2018-03-13 DIAGNOSIS — I10 Essential (primary) hypertension: Secondary | ICD-10-CM | POA: Insufficient documentation

## 2018-03-13 DIAGNOSIS — E876 Hypokalemia: Secondary | ICD-10-CM | POA: Insufficient documentation

## 2018-03-13 LAB — CBC WITH DIFFERENTIAL/PLATELET
Basophils Absolute: 0 10*3/uL (ref 0.0–0.1)
Basophils Relative: 0 %
Eosinophils Absolute: 0.1 10*3/uL (ref 0.0–0.7)
Eosinophils Relative: 1 %
HCT: 44.7 % (ref 36.0–46.0)
Hemoglobin: 14.5 g/dL (ref 12.0–15.0)
Lymphocytes Relative: 32 %
Lymphs Abs: 2.1 10*3/uL (ref 0.7–4.0)
MCH: 27.9 pg (ref 26.0–34.0)
MCHC: 32.4 g/dL (ref 30.0–36.0)
MCV: 86.1 fL (ref 78.0–100.0)
Monocytes Absolute: 0.3 10*3/uL (ref 0.1–1.0)
Monocytes Relative: 5 %
Neutro Abs: 4.2 10*3/uL (ref 1.7–7.7)
Neutrophils Relative %: 62 %
Platelets: 253 10*3/uL (ref 150–400)
RBC: 5.19 MIL/uL — ABNORMAL HIGH (ref 3.87–5.11)
RDW: 14.5 % (ref 11.5–15.5)
WBC: 6.7 10*3/uL (ref 4.0–10.5)

## 2018-03-13 LAB — BASIC METABOLIC PANEL
Anion gap: 12 (ref 5–15)
BUN: 14 mg/dL (ref 6–20)
CO2: 23 mmol/L (ref 22–32)
Calcium: 9.6 mg/dL (ref 8.9–10.3)
Chloride: 104 mmol/L (ref 101–111)
Creatinine, Ser: 1.07 mg/dL — ABNORMAL HIGH (ref 0.44–1.00)
GFR calc Af Amer: >60 mL/min (ref >60)
GFR calc non Af Amer: 58 mL/min — ABNORMAL LOW (ref >60)
Glucose, Bld: 91 mg/dL (ref 65–99)
Potassium: 3.3 mmol/L — ABNORMAL LOW (ref 3.5–5.1)
Sodium: 139 mmol/L (ref 135–145)

## 2018-03-13 LAB — MAGNESIUM: Magnesium: 2.1 mg/dL (ref 1.7–2.4)

## 2018-03-13 NOTE — ED Triage Notes (Signed)
EMS transported .  Initial heart rate SVT 212.  1 dose of Adenosine given and heart rate down.  No complaints upon arrival to the ED.

## 2018-03-13 NOTE — ED Provider Notes (Signed)
[facility] EMERGENCY DEPARTMENT Provider Note   CSN: [id number] Arrival date & time: 03/13/18  1530     History   Chief Complaint Chief Complaint  Patient presents with  . Tachycardia    HPI Vicki [name] is a 52 y.o. female.  HPI   52 year old female with palpitations.  Onset shortly before arrival while at work.  She is brought to the emergency room by EMS.  Initial heart rate per EMS was in the 200s.  She was given a dose of adenosine with conversion to sinus rhythm.  She states that she now feels markedly better.  Denies any acute pain.  No dyspnea.  She reports a past history of similar type symptoms but this is over a year ago.  Past Medical History:  Diagnosis Date  . Abnormal Pap smear   . Anemia   . Anxiety   . BV (bacterial vaginosis) 05/16/2013  . Fibroids    uterine  . Hypertension   . Mental disorder    depression and panic attacks    Patient Active Problem List   Diagnosis Date Noted  . S/P abdominal hysterectomy 02/25/2014  . Depression 07/25/2013  . Panic attacks 07/25/2013  . Fibroids 05/16/2013  . BV (bacterial vaginosis) 05/16/2013  . Anemia 04/03/2012  . Menorrhagia 04/03/2012  . Hypokalemia 04/03/2012  . UTI (lower urinary tract infection) 04/03/2012  . Emesis 04/03/2012    Past Surgical History:  Procedure Laterality Date  . ABDOMINAL HYSTERECTOMY    . BILATERAL SALPINGECTOMY Bilateral 02/25/2014   Procedure: BILATERAL SALPINGECTOMY;  Surgeon: Florian Buff, MD;  Location: [facility];  Service: Gynecology;  Laterality: Bilateral;  . DILATION AND CURETTAGE OF UTERUS    . SUPRACERVICAL ABDOMINAL HYSTERECTOMY N/A 02/25/2014   Procedure: HYSTERECTOMY SUPRACERVICAL ABDOMINAL;  Surgeon: Florian Buff, MD;  Location: [facility];  Service: Gynecology;  Laterality: N/A;  . TUBAL LIGATION      OB History    Gravida Para Term Preterm AB Living   7 5     2 5    SAB TAB Ectopic Multiple Live Births   2               Home Medications    Prior to  Admission medications   Medication Sig Start Date End Date Taking? Authorizing Provider  citalopram (CELEXA) 20 MG tablet Take 20 mg by mouth daily.   Yes [provider]  diclofenac (VOLTAREN) 75 MG EC tablet Take 75 mg by mouth 2 (two) times daily as needed for spasms. 05/21/17  Yes [provider]  diphenhydrAMINE-APAP, sleep, (TYLENOL PM EXTRA STRENGTH) 50-1000 MG/30ML LIQD Take 1 Dose by mouth daily as needed (fever and congestion).   Yes [provider]  hydrochlorothiazide (HYDRODIURIL) 25 MG tablet Take 25 mg by mouth daily.   Yes [provider]  ALPRAZolam Duanne Moron) 0.5 MG tablet Take 0.5 mg by mouth daily.    [provider]    Family History Family History  Problem Relation Age of Onset  . Congestive Heart Failure Mother   . Congestive Heart Failure Maternal Grandmother   . Cancer Paternal Grandmother        ovarian    Social History Social History   Tobacco Use  . Smoking status: Never Smoker  . Smokeless tobacco: Never Used  Substance Use Topics  . Alcohol use: Yes    Alcohol/week: 4.2 oz    Types: 7 Cans of beer per week    Comment: 1 beer daily   .  Drug use: No     Allergies   Patient has no known allergies.   Review of Systems Review of Systems  All systems reviewed and negative, other than as noted in HPI.  Physical Exam Updated Vital Signs BP (!) 127/96   Pulse 97   Temp 97.6 F (36.4 C) (Oral)   Resp 20   Ht 5\' 7"  (1.702 m)   Wt 68 kg (150 lb)   LMP 02/09/2014   SpO2 99%   BMI 23.49 kg/m   Physical Exam  Constitutional: She appears well-developed and well-nourished. No distress.  HENT:  Head: Normocephalic and atraumatic.  Eyes: Conjunctivae are normal. Right eye exhibits no discharge. Left eye exhibits no discharge.  Neck: Neck supple.  Cardiovascular: Normal rate, regular rhythm and normal heart sounds. Exam reveals no gallop and no friction rub.  No murmur heard. Pulmonary/Chest: Effort  normal and breath sounds normal. No respiratory distress.  Abdominal: Soft. She exhibits no distension. There is no tenderness.  Musculoskeletal: She exhibits no edema or tenderness.  Neurological: She is alert.  Skin: Skin is warm and dry.  Psychiatric: She has a normal mood and affect. Her behavior is normal. Thought content normal.  Nursing note and vitals reviewed.    ED Treatments / Results  Labs (all labs ordered are listed, but only abnormal results are displayed) Labs Reviewed  CBC WITH DIFFERENTIAL/PLATELET - Abnormal; Notable for the following components:      Result Value   RBC 5.19 (*)    All other components within normal limits  BASIC METABOLIC PANEL - Abnormal; Notable for the following components:   Potassium 3.3 (*)    Creatinine, Ser 1.07 (*)    GFR calc non Af Amer 58 (*)    All other components within normal limits  MAGNESIUM    EKG  EKG Interpretation  Date/Time:  Wednesday March 13 2018 15:38:12 EDT Ventricular Rate:  94 PR Interval:    QRS Duration: 76 QT Interval:  358 QTC Calculation: 448 R Axis:   36 Text Interpretation:  Sinus rhythm Confirmed by Virgel Manifold [phone]) on 03/13/2018 3:44:17 PM       Radiology No results found.  Procedures Procedures (including critical care time)  Medications Ordered in ED Medications - No data to display   Initial Impression / Assessment and Plan / ED Course  I have reviewed the triage vital signs and the nursing notes.  Pertinent labs & imaging results that were available during my care of the patient were reviewed by me and considered in my medical decision making (see chart for details).     52 year old female with SVT.  Converted with adenosine by EMS.  Sinus rhythm.  No further complaints.  mild hypokalemia.  Supplemented.  Cardiology follow-up.  Final Clinical Impressions(s) / ED Diagnoses   Final diagnoses:  SVT (supraventricular tachycardia) [facility])    ED Discharge Orders    None         Virgel Manifold, MD 03/14/18 [id number]

## 2018-03-27 ENCOUNTER — Other Ambulatory Visit: Payer: Self-pay

## 2018-03-27 ENCOUNTER — Encounter (HOSPITAL_COMMUNITY): Payer: Self-pay | Admitting: Emergency Medicine

## 2018-03-27 ENCOUNTER — Emergency Department (HOSPITAL_COMMUNITY)
Admission: EM | Admit: 2018-03-27 | Discharge: 2018-03-27 | Disposition: A | Discharge status: Home / Self Care | Payer: Self-pay | Attending: Emergency Medicine | Admitting: Emergency Medicine

## 2018-03-27 ENCOUNTER — Emergency Department (HOSPITAL_COMMUNITY): Payer: Self-pay

## 2018-03-27 DIAGNOSIS — R0789 Other chest pain: Secondary | ICD-10-CM | POA: Insufficient documentation

## 2018-03-27 DIAGNOSIS — Z79899 Other long term (current) drug therapy: Secondary | ICD-10-CM | POA: Insufficient documentation

## 2018-03-27 DIAGNOSIS — I10 Essential (primary) hypertension: Secondary | ICD-10-CM | POA: Insufficient documentation

## 2018-03-27 DIAGNOSIS — R42 Dizziness and giddiness: Secondary | ICD-10-CM | POA: Insufficient documentation

## 2018-03-27 DIAGNOSIS — E860 Dehydration: Secondary | ICD-10-CM | POA: Insufficient documentation

## 2018-03-27 LAB — COMPREHENSIVE METABOLIC PANEL
ALT: 16 U/L (ref 14–54)
AST: 22 U/L (ref 15–41)
Albumin: 4 g/dL (ref 3.5–5.0)
Alkaline Phosphatase: 92 U/L (ref 38–126)
Anion gap: 11 (ref 5–15)
BUN: 11 mg/dL (ref 6–20)
CO2: 25 mmol/L (ref 22–32)
Calcium: 9.8 mg/dL (ref 8.9–10.3)
Chloride: 104 mmol/L (ref 101–111)
Creatinine, Ser: 1.14 mg/dL — ABNORMAL HIGH (ref 0.44–1.00)
GFR calc Af Amer: >60 mL/min (ref >60)
GFR calc non Af Amer: 54 mL/min — ABNORMAL LOW (ref >60)
Glucose, Bld: 102 mg/dL — ABNORMAL HIGH (ref 65–99)
Potassium: 3.9 mmol/L (ref 3.5–5.1)
Sodium: 140 mmol/L (ref 135–145)
Total Bilirubin: 0.6 mg/dL (ref 0.3–1.2)
Total Protein: 7.9 g/dL (ref 6.5–8.1)

## 2018-03-27 LAB — CBC
HCT: 45.8 % (ref 36.0–46.0)
Hemoglobin: 14.9 g/dL (ref 12.0–15.0)
MCH: 27.9 pg (ref 26.0–34.0)
MCHC: 32.5 g/dL (ref 30.0–36.0)
MCV: 85.6 fL (ref 78.0–100.0)
Platelets: 254 10*3/uL (ref 150–400)
RBC: 5.35 MIL/uL — ABNORMAL HIGH (ref 3.87–5.11)
RDW: 14.5 % (ref 11.5–15.5)
WBC: 6.3 10*3/uL (ref 4.0–10.5)

## 2018-03-27 LAB — URINALYSIS, ROUTINE W REFLEX MICROSCOPIC
Bilirubin Urine: NEGATIVE
Glucose, UA: NEGATIVE mg/dL
Hgb urine dipstick: NEGATIVE
Ketones, ur: NEGATIVE mg/dL
Leukocytes, UA: NEGATIVE
Nitrite: NEGATIVE
Protein, ur: NEGATIVE mg/dL
Specific Gravity, Urine: 1.004 — ABNORMAL LOW (ref 1.005–1.030)
pH: 5 (ref 5.0–8.0)

## 2018-03-27 LAB — TROPONIN I: Troponin I: <0.03 ng/mL (ref <0.03)

## 2018-03-27 LAB — D-DIMER, QUANTITATIVE: D-Dimer, Quant: 0.32 ug/mL-FEU (ref 0.00–0.50)

## 2018-03-27 MED ORDER — SODIUM CHLORIDE 0.9 % IV BOLUS
500.0000 mL | Freq: Once | INTRAVENOUS | Status: AC
  Administered 2018-03-27: 500 mL via INTRAVENOUS

## 2018-03-27 MED ORDER — MECLIZINE HCL 12.5 MG PO TABS: 12.5000 mg | ORAL_TABLET | Freq: Three times a day (TID) | ORAL | 0 refills | Status: DC | PRN

## 2018-03-27 MED ORDER — FLUTICASONE PROPIONATE 50 MCG/ACT NA SUSP: 1.0000 | Freq: Every day | NASAL | 0 refills | Status: DC

## 2018-03-27 NOTE — ED Provider Notes (Signed)
[facility] EMERGENCY DEPARTMENT Provider Note   CSN: [id number] Arrival date & time: 03/27/18  1034     History   Chief Complaint Chief Complaint  Patient presents with  . Dizziness    HPI Vicki [name] is a 52 y.o. female presenting for evaluation of dizziness and chest pain.  Patient states that she woke up yesterday with dizziness.  She describes it as feeling like the room is spinning.  This occurs when she turns her head or changes positions.  No dizziness at rest.  She reports history of similar several years ago when she had an ear infection.  She has not taken anything for her symptoms.  She reports recent allergies and mild nasal congestion.  No fevers, chills, ear pain, eye pain, sore throat, cough.  Several sick contacts.  This morning, patient states she developed left-sided chest pain.  This is present and worse with inspiration and movement.  Nothing makes it better or worse.  It does not radiate.  It is constant.  She states this is how she felt prior to having an episode of SVT several weeks ago.  She never followed up with cardiologist.  She denies associated shortness of breath, nausea, vomiting.  She denies abdominal pain, urinary symptoms, abnormal bowel movements.  No leg pain or swelling.  No recent travel, surgeries, immobilization, trauma.  No history of cancer or prior DVT.  Not on OCPs.  Previous SVT, no other cardiac history.  History of hypertension, no diabetes.   HPI  Past Medical History:  Diagnosis Date  . Abnormal Pap smear   . Anemia   . Anxiety   . BV (bacterial vaginosis) 05/16/2013  . Fibroids    uterine  . Hypertension   . Mental disorder    depression and panic attacks    Patient Active Problem List   Diagnosis Date Noted  . S/P abdominal hysterectomy 02/25/2014  . Depression 07/25/2013  . Panic attacks 07/25/2013  . Fibroids 05/16/2013  . BV (bacterial vaginosis) 05/16/2013  . Anemia 04/03/2012  . Menorrhagia 04/03/2012  .  Hypokalemia 04/03/2012  . UTI (lower urinary tract infection) 04/03/2012  . Emesis 04/03/2012    Past Surgical History:  Procedure Laterality Date  . ABDOMINAL HYSTERECTOMY    . BILATERAL SALPINGECTOMY Bilateral 02/25/2014   Procedure: BILATERAL SALPINGECTOMY;  Surgeon: Florian Buff, MD;  Location: [facility];  Service: Gynecology;  Laterality: Bilateral;  . DILATION AND CURETTAGE OF UTERUS    . SUPRACERVICAL ABDOMINAL HYSTERECTOMY N/A 02/25/2014   Procedure: HYSTERECTOMY SUPRACERVICAL ABDOMINAL;  Surgeon: Florian Buff, MD;  Location: [facility];  Service: Gynecology;  Laterality: N/A;  . TUBAL LIGATION       OB History    Gravida  7   Para  5   Term      Preterm      AB  2   Living  5     SAB  2   TAB      Ectopic      Multiple      Live Births               Home Medications    Prior to Admission medications   Medication Sig Start Date End Date Taking? Authorizing Provider  ALPRAZolam Duanne Moron) 0.5 MG tablet Take 0.5 mg by mouth daily.    [provider]  citalopram (CELEXA) 20 MG tablet Take 20 mg by mouth daily.    [provider]  diclofenac (VOLTAREN)  75 MG EC tablet Take 75 mg by mouth 2 (two) times daily as needed for spasms. 05/21/17   [provider]  diphenhydrAMINE-APAP, sleep, (TYLENOL PM EXTRA STRENGTH) 50-1000 MG/30ML LIQD Take 1 Dose by mouth daily as needed (fever and congestion).    [provider]  fluticasone (FLONASE) 50 MCG/ACT nasal spray Place 1 spray into both nostrils daily. 03/27/18   Niki Payment, PA-C  hydrochlorothiazide (HYDRODIURIL) 25 MG tablet Take 25 mg by mouth daily.    [provider]  meclizine (ANTIVERT) 12.5 MG tablet Take 1 tablet (12.5 mg total) by mouth 3 (three) times daily as needed for dizziness. 03/27/18   Marvelle Caudill, PA-C    Family History Family History  Problem Relation Age of Onset  . Congestive Heart Failure Mother   . Congestive Heart Failure Maternal Grandmother    . Cancer Paternal Grandmother        ovarian    Social History Social History   Tobacco Use  . Smoking status: Never Smoker  . Smokeless tobacco: Never Used  Substance Use Topics  . Alcohol use: Yes    Alcohol/week: 4.2 oz    Types: 7 Cans of beer per week    Comment: 1 beer daily   . Drug use: No     Allergies   Patient has no known allergies.   Review of Systems Review of Systems  Cardiovascular: Positive for chest pain.  Neurological: Positive for dizziness.  All other systems reviewed and are negative.    Physical Exam Updated Vital Signs BP 138/77   Pulse 64   Temp 97.8 F (36.6 C) (Oral)   Resp 18   Ht 5\' 7"  (1.702 m)   Wt 68.5 kg (151 lb)   LMP 02/09/2014   SpO2 100%   BMI 23.65 kg/m   Physical Exam  Constitutional: She is oriented to person, place, and time. She appears well-developed and well-nourished. No distress.  Pt resting comfortably in bed in NAD  HENT:  Head: Normocephalic and atraumatic.  Right Ear: Tympanic membrane, external ear and ear canal normal.  Left Ear: Tympanic membrane, external ear and ear canal normal.  Nose: Mucosal edema present.  Mouth/Throat: Uvula is midline, oropharynx is clear and moist and mucous membranes are normal.  Eyes: Pupils are equal, round, and reactive to light. Conjunctivae and EOM are normal.  EOMI and PERRLA. No nystagmus  Neck: Normal range of motion. Neck supple.  Cardiovascular: Normal rate, regular rhythm and intact distal pulses.  Pulmonary/Chest: Effort normal and breath sounds normal. No respiratory distress. She has no wheezes. She exhibits no tenderness.  Speaking in full sentences. Clear lung sounds. No respiratory distress  Abdominal: Soft. She exhibits no distension and no mass. There is no tenderness. There is no guarding.  Musculoskeletal: Normal range of motion.  No leg pain or swelling. Strength intact x4. Sensation intact x4. Color and warmth equal bilaterally. Radial and pedal  pulses intact bilaterally.   Neurological: She is alert and oriented to person, place, and time. She has normal strength. No cranial nerve deficit or sensory deficit. GCS eye subscore is 4. GCS verbal subscore is 5. GCS motor subscore is 6.  Nose to finger without difficulty  Skin: Skin is warm and dry.  Psychiatric: She has a normal mood and affect.  Nursing note and vitals reviewed.    ED Treatments / Results  Labs (all labs ordered are listed, but only abnormal results are displayed) Labs Reviewed  CBC - Abnormal; Notable  for the following components:      Result Value   RBC 5.35 (*)    All other components within normal limits  COMPREHENSIVE METABOLIC PANEL - Abnormal; Notable for the following components:   Glucose, Bld 102 (*)    Creatinine, Ser 1.14 (*)    GFR calc non Af Amer 54 (*)    All other components within normal limits  URINALYSIS, ROUTINE W REFLEX MICROSCOPIC - Abnormal; Notable for the following components:   Color, Urine STRAW (*)    Specific Gravity, Urine 1.004 (*)    All other components within normal limits  D-DIMER, QUANTITATIVE (NOT AT [facility])  TROPONIN I    EKG None  Radiology Dg Chest 2 View  Result Date: 03/27/2018 CLINICAL DATA:  52 year old female with mid chest pain radiating to the left side since this morning with some shortness of breath and cough. EXAM: CHEST - 2 VIEW COMPARISON:  03/29/2017 chest radiographs and earlier. FINDINGS: Mildly lower lung volumes compared to 2018. Mediastinal contours remain normal. Visualized tracheal air column is within normal limits. No pneumothorax, pulmonary edema, pleural effusion or confluent pulmonary opacity. No acute osseous abnormality identified. Negative visible bowel gas pattern. IMPRESSION: No acute cardiopulmonary abnormality. Electronically Signed   By: Genevie Ann M.D.   On: 03/27/2018 12:05    Procedures Procedures (including critical care time)  Medications Ordered in ED Medications  sodium  chloride 0.9 % bolus 500 mL (0 mLs Intravenous Stopped 03/27/18 1234)     Initial Impression / Assessment and Plan / ED Course  I have reviewed the triage vital signs and the nursing notes.  Pertinent labs & imaging results that were available during my care of the patient were reviewed by me and considered in my medical decision making (see chart for details).     Patient presenting for evaluation of dizziness and chest pain.  Physical exam and history reassuring, patient with dizziness with movement of her head.  Likely peripheral.  No obvious neurologic deficits.  Patient also reporting chest pain.  No sign of respiratory distress.  Resting comfortably in bed.  Will obtain labs including dimer and troponin, urine, chest x-ray, and EKG. Will give bolus.  EKG and troponin reassuring, doubt STEMI or ACS. Heart score 3, low risk.  D-dimer negative, doubt PE.  UA and labs show mild dehydration.  Chest x-ray viewed and interpreted by me, negative for infiltrate or other acute pathology.  Discussed findings with patient.   Will give low-dose meclizine for dizziness and cautioned patient on dehydration.  Flonase for nasal congestion.  Follow-up with PCP for continued dizziness.  Follow-up with cardiologist for continued chest pain.  At this time, patient present for discharge.  Return precautions given.  Patient states she understands agrees to plan.  Final Clinical Impressions(s) / ED Diagnoses   Final diagnoses:  Dizziness  Atypical chest pain  Dehydration    ED Discharge Orders        Ordered    meclizine (ANTIVERT) 12.5 MG tablet  3 times daily PRN     03/27/18 1217    fluticasone (FLONASE) 50 MCG/ACT nasal spray  Daily     03/27/18 1217       Franchot Heidelberg, PA-C 03/27/18 1302    Julianne Rice, MD 03/27/18 [phone]

## 2018-03-27 NOTE — Discharge Instructions (Signed)
Your dizziness is likely due to your dehydration and nasal congestion. Use flonase daily.  Make sure your are drinking lots of water- your urine should be clear to pale yellow.  Use meclizine as needed for dizziness.  Follow up with your primary care doctor if your dizziness doe snot improve.  Follow up with your heart doctor for further evaluation of your chest pain.  Return to the ER if you develop difficulty breathing, worsening pain, constant dizziness, weakness/numbness, or any new or concerning symptoms.

## 2018-03-27 NOTE — ED Triage Notes (Signed)
Pt reports dizziness since yesterday, chest pain since this am. Pt reports history of same. Pt reports chest pain is worse with movement and deep breath. nad noted.

## 2018-05-13 ENCOUNTER — Other Ambulatory Visit (HOSPITAL_COMMUNITY): Payer: Self-pay | Admitting: Family Medicine

## 2018-05-13 DIAGNOSIS — Z1231 Encounter for screening mammogram for malignant neoplasm of breast: Secondary | ICD-10-CM

## 2018-05-15 ENCOUNTER — Encounter: Payer: Self-pay | Admitting: Gastroenterology

## 2018-05-27 ENCOUNTER — Ambulatory Visit (HOSPITAL_COMMUNITY): Payer: Self-pay

## 2018-08-19 ENCOUNTER — Ambulatory Visit: Payer: Self-pay | Admitting: Gastroenterology

## 2018-08-19 ENCOUNTER — Encounter: Payer: Self-pay | Admitting: Gastroenterology

## 2018-08-19 ENCOUNTER — Telehealth: Payer: Self-pay | Admitting: Gastroenterology

## 2018-08-19 NOTE — Telephone Encounter (Signed)
PATIENT WAS A NO SHOW AND LETTER SENT  °

## 2018-09-23 ENCOUNTER — Encounter (HOSPITAL_COMMUNITY): Payer: Self-pay | Admitting: Emergency Medicine

## 2018-09-23 ENCOUNTER — Emergency Department (HOSPITAL_COMMUNITY): Payer: Self-pay

## 2018-09-23 ENCOUNTER — Emergency Department (HOSPITAL_COMMUNITY)
Admission: EM | Admit: 2018-09-23 | Discharge: 2018-09-23 | Disposition: A | Discharge status: Home / Self Care | Payer: Self-pay | Attending: Emergency Medicine | Admitting: Emergency Medicine

## 2018-09-23 ENCOUNTER — Other Ambulatory Visit: Payer: Self-pay

## 2018-09-23 DIAGNOSIS — J069 Acute upper respiratory infection, unspecified: Secondary | ICD-10-CM | POA: Insufficient documentation

## 2018-09-23 DIAGNOSIS — Z79899 Other long term (current) drug therapy: Secondary | ICD-10-CM | POA: Insufficient documentation

## 2018-09-23 DIAGNOSIS — I10 Essential (primary) hypertension: Secondary | ICD-10-CM | POA: Insufficient documentation

## 2018-09-23 DIAGNOSIS — R0602 Shortness of breath: Secondary | ICD-10-CM | POA: Insufficient documentation

## 2018-09-23 MED ORDER — GUAIFENESIN-DM 100-10 MG/5ML PO SYRP: 5.0000 mL | ORAL_SOLUTION | ORAL | 0 refills | Status: DC | PRN

## 2018-09-23 NOTE — ED Provider Notes (Signed)
[facility] EMERGENCY DEPARTMENT Provider Note   CSN: [id number] Arrival date & time: 09/23/18  1345     History   Chief Complaint Chief Complaint  Patient presents with  . Cough  . Shortness of Breath    HPI Vicki [name] is a 52 y.o. female.  HPI Patient presents with shortness of breath and cough.  Has been coughing for around a week and a half but got worse last night.  States she had some difficulty sleeping.  States she took some cough medicine to help but really not help.  Some stress tightness.  No real sputum production.  Some dull chest pain.  No fevers.  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 Past Medical History:  Diagnosis Date  . Abnormal Pap smear   . Anemia   . Anxiety   . BV (bacterial vaginosis) 05/16/2013  . Fibroids    uterine  . Hypertension   . Mental disorder    depression and panic attacks    Patient Active Problem List   Diagnosis Date Noted  . S/P abdominal hysterectomy 02/25/2014  . Depression 07/25/2013  . Panic attacks 07/25/2013  . Fibroids 05/16/2013  . BV (bacterial vaginosis) 05/16/2013  . Anemia 04/03/2012  . Menorrhagia 04/03/2012  . Hypokalemia 04/03/2012  . UTI (lower urinary tract infection) 04/03/2012  . Emesis 04/03/2012    Past Surgical History:  Procedure Laterality Date  . ABDOMINAL HYSTERECTOMY    . BILATERAL SALPINGECTOMY Bilateral 02/25/2014   Procedure: BILATERAL SALPINGECTOMY;  Surgeon: Florian Buff, MD;  Location: [facility];  Service: Gynecology;  Laterality: Bilateral;  . DILATION AND CURETTAGE OF UTERUS    . SUPRACERVICAL ABDOMINAL HYSTERECTOMY N/A 02/25/2014   Procedure: HYSTERECTOMY SUPRACERVICAL ABDOMINAL;  Surgeon: Florian Buff, MD;  Location: [facility];  Service: Gynecology;  Laterality: N/A;  . TUBAL LIGATION       OB History    Gravida  7   Para  5   Term      Preterm      AB  2   Living  5     SAB  2   TAB      Ectopic      Multiple      Live Births               Home Medications    Prior to Admission medications   Medication Sig Start Date End Date Taking? Authorizing Provider  carvedilol (COREG) 3.125 MG tablet Take 3.125 mg by mouth 2 (two) times daily. 08/20/18  Yes [provider]  citalopram (CELEXA) 20 MG tablet Take 20 mg by mouth every evening.    Yes [provider]  hydrOXYzine (ATARAX/VISTARIL) 25 MG tablet Take 25 mg by mouth 3 (three) times daily as needed. for anxiety 08/20/18  Yes [provider]  guaiFENesin-dextromethorphan (ROBITUSSIN DM) 100-10 MG/5ML syrup Take 5 mLs by mouth every 4 (four) hours as needed for cough. 09/23/18   Davonna Belling, MD    Family History Family History  Problem Relation Age of Onset  . Congestive Heart Failure Mother   . Congestive Heart Failure Maternal Grandmother   . Cancer Paternal Grandmother        ovarian    Social History Social History   Tobacco Use  . Smoking status: Never Smoker  . Smokeless tobacco: Never Used  Substance Use Topics  . Alcohol use: Yes    Alcohol/week: 7.0 standard drinks  Types: 7 Cans of beer per week    Comment: 1 beer daily   . Drug use: No     Allergies   Patient has no known allergies.   Review of Systems Review of Systems  Constitutional: Negative for appetite change.  HENT: Negative for congestion.   Respiratory: Positive for cough and shortness of breath.   Cardiovascular: Positive for chest pain.  Gastrointestinal: Negative for abdominal pain.  Genitourinary: Negative for frequency.  Musculoskeletal: Negative for back pain.  Skin: Negative for rash.  Neurological: Negative for weakness.  Psychiatric/Behavioral: Negative for confusion.     Physical Exam Updated Vital Signs BP (!) 161/88 (BP Location: Right Arm)   Pulse 83   Temp 97.7 F (36.5 C) (Oral)   Resp 19   Ht 5\' 7"  (1.702 m)   Wt 73.9 kg   LMP 02/09/2014   SpO2 100%   BMI 25.53 kg/m   Physical Exam  Constitutional: She  appears well-developed.  HENT:  Head: Atraumatic.  Neck: Neck supple.  Cardiovascular: Regular rhythm.  Pulmonary/Chest: Effort normal. No tachypnea.  Occasional coughs.  Abdominal: Soft. There is no tenderness.  Musculoskeletal:       Right lower leg: She exhibits no edema.       Left lower leg: She exhibits no edema.  Skin: Skin is warm. Capillary refill takes less than 2 seconds.  Psychiatric: She has a normal mood and affect.     ED Treatments / Results  Labs (all labs ordered are listed, but only abnormal results are displayed) Labs Reviewed - No data to display  EKG EKG Interpretation  Date/Time:  Monday September 23 2018 13:53:08 EDT Ventricular Rate:  81 PR Interval:  126 QRS Duration: 68 QT Interval:  392 QTC Calculation: 455 R Axis:   75 Text Interpretation:  Unusual P axis, possible ectopic atrial rhythm Abnormal QRS-T angle, consider primary T wave abnormality Abnormal ECG  Suspect arm lead reversal, interpretation assumes no reversal Confirmed by Davonna Belling [phone]) on 09/23/2018 3:52:33 PM   EKG Interpretation  Date/Time:  Monday September 23 2018 16:51:53 EDT Ventricular Rate:  73 PR Interval:  126 QRS Duration: 76 QT Interval:  409 QTC Calculation: 451 R Axis:   47 Text Interpretation:  Sinus rhythm Confirmed by Davonna Belling [phone]) on 09/23/2018 5:32:15 PM        Radiology Dg Chest 2 View  Result Date: 09/23/2018 CLINICAL DATA:  Cough, shortness of breath for 1.5 weeks EXAM: CHEST - 2 VIEW COMPARISON:  03/27/2018 FINDINGS: The heart size and mediastinal contours are within normal limits. Both lungs are clear. The visualized skeletal structures are unremarkable. IMPRESSION: No active cardiopulmonary disease. Electronically Signed   By: Kathreen Devoid   On: 09/23/2018 14:28    Procedures Procedures (including critical care time)  Medications Ordered in ED Medications - No data to display   Initial Impression / Assessment and Plan / ED  Course  I have reviewed the triage vital signs and the nursing notes.  Pertinent labs & imaging results that were available during my care of the patient were reviewed by me and considered in my medical decision making (see chart for details).     Patient with cough shortness of breath.  Works at [employer] with people with similar symptoms.  X-ray reassuring.  EKG repeated due to and abnormalities likely technically based on initial EKG.  Repeat reassuring.  Discharge home with symptomatic treatment per  Final Clinical Impressions(s) / ED Diagnoses   Final  diagnoses:  Upper respiratory tract infection, unspecified type    ED Discharge Orders         Ordered    guaiFENesin-dextromethorphan (ROBITUSSIN DM) 100-10 MG/5ML syrup  Every 4 hours PRN,   Status:  Discontinued     09/23/18 1725    guaiFENesin-dextromethorphan (ROBITUSSIN DM) 100-10 MG/5ML syrup  Every 4 hours PRN     09/23/18 1726           Davonna Belling, MD 09/23/18 1732

## 2018-09-23 NOTE — ED Triage Notes (Signed)
Coughing for one week. Now feels SOB and tightness with breathing.

## 2018-09-23 NOTE — ED Notes (Signed)
States she has been having a cough for quite sometime.

## 2018-11-09 ENCOUNTER — Other Ambulatory Visit: Payer: Self-pay

## 2018-11-09 ENCOUNTER — Encounter (HOSPITAL_COMMUNITY): Payer: Self-pay | Admitting: Emergency Medicine

## 2018-11-09 ENCOUNTER — Emergency Department (HOSPITAL_COMMUNITY)
Admission: EM | Admit: 2018-11-09 | Discharge: 2018-11-09 | Disposition: A | Discharge status: Home / Self Care | Payer: Managed Care, Other (non HMO) | Attending: Emergency Medicine | Admitting: Emergency Medicine

## 2018-11-09 ENCOUNTER — Emergency Department (HOSPITAL_COMMUNITY): Payer: Managed Care, Other (non HMO)

## 2018-11-09 DIAGNOSIS — Z79899 Other long term (current) drug therapy: Secondary | ICD-10-CM | POA: Diagnosis not present

## 2018-11-09 DIAGNOSIS — R42 Dizziness and giddiness: Secondary | ICD-10-CM | POA: Insufficient documentation

## 2018-11-09 DIAGNOSIS — I10 Essential (primary) hypertension: Secondary | ICD-10-CM | POA: Insufficient documentation

## 2018-11-09 LAB — COMPREHENSIVE METABOLIC PANEL
ALT: 17 U/L (ref 0–44)
AST: 25 U/L (ref 15–41)
Albumin: 4.4 g/dL (ref 3.5–5.0)
Alkaline Phosphatase: 89 U/L (ref 38–126)
Anion gap: 7 (ref 5–15)
BUN: 10 mg/dL (ref 6–20)
CO2: 26 mmol/L (ref 22–32)
Calcium: 9.7 mg/dL (ref 8.9–10.3)
Chloride: 107 mmol/L (ref 98–111)
Creatinine, Ser: 1.04 mg/dL — ABNORMAL HIGH (ref 0.44–1.00)
GFR calc Af Amer: >60 mL/min (ref >60)
GFR calc non Af Amer: >60 mL/min (ref >60)
Glucose, Bld: 91 mg/dL (ref 70–99)
Potassium: 3.6 mmol/L (ref 3.5–5.1)
Sodium: 140 mmol/L (ref 135–145)
Total Bilirubin: 0.8 mg/dL (ref 0.3–1.2)
Total Protein: 8.1 g/dL (ref 6.5–8.1)

## 2018-11-09 LAB — CBG MONITORING, ED: Glucose-Capillary: 92 mg/dL (ref 70–99)

## 2018-11-09 LAB — URINALYSIS, ROUTINE W REFLEX MICROSCOPIC
Bilirubin Urine: NEGATIVE
Glucose, UA: NEGATIVE mg/dL
Hgb urine dipstick: NEGATIVE
Ketones, ur: NEGATIVE mg/dL
Leukocytes, UA: NEGATIVE
Nitrite: NEGATIVE
Protein, ur: NEGATIVE mg/dL
Specific Gravity, Urine: 1.012 (ref 1.005–1.030)
pH: 6 (ref 5.0–8.0)

## 2018-11-09 LAB — CBC WITH DIFFERENTIAL/PLATELET
Abs Immature Granulocytes: 0.01 10*3/uL (ref 0.00–0.07)
Basophils Absolute: 0.1 10*3/uL (ref 0.0–0.1)
Basophils Relative: 1 %
Eosinophils Absolute: 0.1 10*3/uL (ref 0.0–0.5)
Eosinophils Relative: 3 %
HCT: 45.8 % (ref 36.0–46.0)
Hemoglobin: 14.7 g/dL (ref 12.0–15.0)
Immature Granulocytes: 0 %
Lymphocytes Relative: 42 %
Lymphs Abs: 2.4 10*3/uL (ref 0.7–4.0)
MCH: 27.3 pg (ref 26.0–34.0)
MCHC: 32.1 g/dL (ref 30.0–36.0)
MCV: 85.1 fL (ref 80.0–100.0)
Monocytes Absolute: 0.4 10*3/uL (ref 0.1–1.0)
Monocytes Relative: 7 %
Neutro Abs: 2.7 10*3/uL (ref 1.7–7.7)
Neutrophils Relative %: 47 %
Platelets: 268 10*3/uL (ref 150–400)
RBC: 5.38 MIL/uL — ABNORMAL HIGH (ref 3.87–5.11)
RDW: 15 % (ref 11.5–15.5)
WBC: 5.6 10*3/uL (ref 4.0–10.5)
nRBC: 0 % (ref 0.0–0.2)

## 2018-11-09 MED ORDER — MECLIZINE HCL 25 MG PO TABS: 25.0000 mg | ORAL_TABLET | Freq: Three times a day (TID) | ORAL | 0 refills | Status: DC | PRN

## 2018-11-09 MED ORDER — MECLIZINE HCL 12.5 MG PO TABS
25.0000 mg | ORAL_TABLET | Freq: Once | ORAL | Status: AC
  Administered 2018-11-09: 25 mg via ORAL
  Filled 2018-11-09: qty 2

## 2018-11-09 NOTE — Discharge Instructions (Signed)
Tests were good.  Rest.  Increase fluids.  Medication for dizziness.

## 2018-11-09 NOTE — ED Triage Notes (Signed)
Patient complaining of dizziness x 3 days. Denies pain.

## 2018-11-09 NOTE — ED Provider Notes (Signed)
[facility] EMERGENCY DEPARTMENT Provider Note   CSN: [id number] Arrival date & time: 11/09/18  1213     History   Chief Complaint Chief Complaint  Patient presents with  . Dizziness    HPI Vicki [name] is a 52 y.o. female.  Patient reports dizziness since Thursday night described as occasional room spinning and "almost passing out".  She states increased stress from working too many hours at work.  Past medical history hypertension and depression.  No fever, chills, stiff neck, gross neuro deficits, visual changes, facial asymmetry.  Severity of symptoms is mild.  Nothing makes symptoms better or worse.     Past Medical History:  Diagnosis Date  . Abnormal Pap smear   . Anemia   . Anxiety   . BV (bacterial vaginosis) 05/16/2013  . Fibroids    uterine  . Hypertension   . Mental disorder    depression and panic attacks    Patient Active Problem List   Diagnosis Date Noted  . S/P abdominal hysterectomy 02/25/2014  . Depression 07/25/2013  . Panic attacks 07/25/2013  . Fibroids 05/16/2013  . BV (bacterial vaginosis) 05/16/2013  . Anemia 04/03/2012  . Menorrhagia 04/03/2012  . Hypokalemia 04/03/2012  . UTI (lower urinary tract infection) 04/03/2012  . Emesis 04/03/2012    Past Surgical History:  Procedure Laterality Date  . ABDOMINAL HYSTERECTOMY    . BILATERAL SALPINGECTOMY Bilateral 02/25/2014   Procedure: BILATERAL SALPINGECTOMY;  Surgeon: Florian Buff, MD;  Location: [facility];  Service: Gynecology;  Laterality: Bilateral;  . DILATION AND CURETTAGE OF UTERUS    . SUPRACERVICAL ABDOMINAL HYSTERECTOMY N/A 02/25/2014   Procedure: HYSTERECTOMY SUPRACERVICAL ABDOMINAL;  Surgeon: Florian Buff, MD;  Location: [facility];  Service: Gynecology;  Laterality: N/A;  . TUBAL LIGATION       OB History    Gravida  7   Para  5   Term      Preterm      AB  2   Living  5     SAB  2   TAB      Ectopic      Multiple      Live Births               Home  Medications    Prior to Admission medications   Medication Sig Start Date End Date Taking? Authorizing Provider  carvedilol (COREG) 3.125 MG tablet Take 3.125 mg by mouth 2 (two) times daily. 08/20/18  Yes [provider]  citalopram (CELEXA) 20 MG tablet Take 20 mg by mouth every evening.    Yes [provider]  hydrOXYzine (ATARAX/VISTARIL) 25 MG tablet Take 25 mg by mouth 3 (three) times daily as needed. for anxiety 08/20/18  Yes [provider]  meclizine (ANTIVERT) 25 MG tablet Take 1 tablet (25 mg total) by mouth 3 (three) times daily as needed for dizziness. 11/09/18   Nat Christen, MD    Family History Family History  Problem Relation Age of Onset  . Congestive Heart Failure Mother   . Congestive Heart Failure Maternal Grandmother   . Cancer Paternal Grandmother        ovarian    Social History Social History   Tobacco Use  . Smoking status: Never Smoker  . Smokeless tobacco: Never Used  Substance Use Topics  . Alcohol use: Yes    Alcohol/week: 7.0 standard drinks    Types: 7 Cans of beer per week    Comment: 1  beer daily   . Drug use: No     Allergies   Patient has no known allergies.   Review of Systems Review of Systems  All other systems reviewed and are negative.    Physical Exam Updated Vital Signs BP (!) 152/85   Pulse 63   Temp 98.1 F (36.7 C) (Oral)   Resp 15   Ht 5\' 7"  (1.702 m)   Wt 73.9 kg   LMP 02/09/2014   SpO2 94%   BMI 25.53 kg/m   Physical Exam  Constitutional: She is oriented to person, place, and time. She appears well-developed and well-nourished.  HENT:  Head: Normocephalic and atraumatic.  Eyes: Conjunctivae are normal.  Neck: Neck supple.  Cardiovascular: Normal rate and regular rhythm.  Pulmonary/Chest: Effort normal and breath sounds normal.  Abdominal: Soft. Bowel sounds are normal.  Musculoskeletal: Normal range of motion.  Neurological: She is alert and oriented to person, place, and time.   Skin: Skin is warm and dry.  Psychiatric: She has a normal mood and affect. Her behavior is normal.  Nursing note and vitals reviewed.    ED Treatments / Results  Labs (all labs ordered are listed, but only abnormal results are displayed) Labs Reviewed  CBC WITH DIFFERENTIAL/PLATELET - Abnormal; Notable for the following components:      Result Value   RBC 5.38 (*)    All other components within normal limits  COMPREHENSIVE METABOLIC PANEL - Abnormal; Notable for the following components:   Creatinine, Ser 1.04 (*)    All other components within normal limits  URINALYSIS, ROUTINE W REFLEX MICROSCOPIC  CBG MONITORING, ED    EKG EKG Interpretation  Date/Time:  Saturday November 09 2018 12:20:41 EST Ventricular Rate:  69 PR Interval:    QRS Duration: 77 QT Interval:  421 QTC Calculation: 451 R Axis:   41 Text Interpretation:  Sinus rhythm Abnormal R-wave progression, early transition Confirmed by Nat Christen [phone]) on 11/09/2018 1:59:17 PM   Radiology Ct Head Wo Contrast  Result Date: 11/09/2018 CLINICAL DATA:  Dizziness x 3 days, no pain, hx of htn/bbj EXAM: CT HEAD WITHOUT CONTRAST TECHNIQUE: Contiguous axial images were obtained from the base of the skull through the vertex without intravenous contrast. COMPARISON:  03/29/2017 FINDINGS: Brain: No evidence of acute infarction, hemorrhage, hydrocephalus, extra-axial collection or mass lesion/mass effect. Vascular: No hyperdense vessel or unexpected calcification. Skull: Normal. Negative for fracture or focal lesion. Sinuses/Orbits: No acute finding. Other: None. IMPRESSION: Negative Electronically Signed   By: Lucrezia Europe M.D.   On: 11/09/2018 13:17    Procedures Procedures (including critical care time)  Medications Ordered in ED Medications  meclizine (ANTIVERT) tablet 25 mg (25 mg Oral Given 11/09/18 1248)     Initial Impression / Assessment and Plan / ED Course  I have reviewed the triage vital signs and the  nursing notes.  Pertinent labs & imaging results that were available during my care of the patient were reviewed by me and considered in my medical decision making (see chart for details).     History and physical most consistent with benign positional vertigo.  She had a normal physical exam.  Head CT negative.  Normal EKG and hemoglobin.  Symptoms improved with meclizine 25 mg.  Will discharge home with same.  Final Clinical Impressions(s) / ED Diagnoses   Final diagnoses:  Vertigo    ED Discharge Orders         Ordered    meclizine (ANTIVERT) 25 MG tablet  3 times daily PRN     11/09/18 1604           Nat Christen, MD 11/09/18 1646

## 2018-12-25 DIAGNOSIS — F32A Depression, unspecified: Secondary | ICD-10-CM

## 2018-12-25 DIAGNOSIS — F419 Anxiety disorder, unspecified: Secondary | ICD-10-CM

## 2018-12-25 HISTORY — DX: Anxiety disorder, unspecified: F41.9

## 2018-12-25 HISTORY — DX: Depression, unspecified: F32.A

## 2019-03-07 ENCOUNTER — Emergency Department (HOSPITAL_COMMUNITY)
Admission: EM | Admit: 2019-03-07 | Discharge: 2019-03-07 | Disposition: A | Discharge status: Home / Self Care | Payer: Managed Care, Other (non HMO) | Attending: Emergency Medicine | Admitting: Emergency Medicine

## 2019-03-07 ENCOUNTER — Encounter (HOSPITAL_COMMUNITY): Payer: Self-pay | Admitting: Emergency Medicine

## 2019-03-07 ENCOUNTER — Other Ambulatory Visit: Payer: Self-pay

## 2019-03-07 DIAGNOSIS — Z79899 Other long term (current) drug therapy: Secondary | ICD-10-CM | POA: Insufficient documentation

## 2019-03-07 DIAGNOSIS — R197 Diarrhea, unspecified: Secondary | ICD-10-CM | POA: Insufficient documentation

## 2019-03-07 DIAGNOSIS — R112 Nausea with vomiting, unspecified: Secondary | ICD-10-CM

## 2019-03-07 DIAGNOSIS — I10 Essential (primary) hypertension: Secondary | ICD-10-CM | POA: Diagnosis not present

## 2019-03-07 DIAGNOSIS — Z0279 Encounter for issue of other medical certificate: Secondary | ICD-10-CM | POA: Diagnosis present

## 2019-03-07 NOTE — Discharge Instructions (Addendum)
Temperature today was 97.9.  Patient is medically stable to return to work tomorrow.  (Saturday)

## 2019-03-07 NOTE — ED Triage Notes (Signed)
Pt was sick with n/v on Wednesday.  Pt says she need work-note to return to work, works in [employer].

## 2019-03-07 NOTE — ED Provider Notes (Signed)
[facility] EMERGENCY DEPARTMENT Provider Note   CSN: [id number] Arrival date & time: 03/07/19  5027    History   Chief Complaint Chief Complaint  Patient presents with  . work note to return to work.    HPI Vicki [name] is a 53 y.o. female.     Nausea, vomiting, diarrhea for 2 days, getting better.  Patient cannot return to work until she has a work note.  Symptoms have abated.  She has a known history of hypertension and has been taking her medicine.  Presently, no fever, sweats, chills, dysuria.     Past Medical History:  Diagnosis Date  . Abnormal Pap smear   . Anemia   . Anxiety   . BV (bacterial vaginosis) 05/16/2013  . Fibroids    uterine  . Hypertension   . Mental disorder    depression and panic attacks    Patient Active Problem List   Diagnosis Date Noted  . S/P abdominal hysterectomy 02/25/2014  . Depression 07/25/2013  . Panic attacks 07/25/2013  . Fibroids 05/16/2013  . BV (bacterial vaginosis) 05/16/2013  . Anemia 04/03/2012  . Menorrhagia 04/03/2012  . Hypokalemia 04/03/2012  . UTI (lower urinary tract infection) 04/03/2012  . Emesis 04/03/2012    Past Surgical History:  Procedure Laterality Date  . ABDOMINAL HYSTERECTOMY    . BILATERAL SALPINGECTOMY Bilateral 02/25/2014   Procedure: BILATERAL SALPINGECTOMY;  Surgeon: Florian Buff, MD;  Location: [facility];  Service: Gynecology;  Laterality: Bilateral;  . DILATION AND CURETTAGE OF UTERUS    . SUPRACERVICAL ABDOMINAL HYSTERECTOMY N/A 02/25/2014   Procedure: HYSTERECTOMY SUPRACERVICAL ABDOMINAL;  Surgeon: Florian Buff, MD;  Location: [facility];  Service: Gynecology;  Laterality: N/A;  . TUBAL LIGATION       OB History    Gravida  7   Para  5   Term      Preterm      AB  2   Living  5     SAB  2   TAB      Ectopic      Multiple      Live Births               Home Medications    Prior to Admission medications   Medication Sig Start Date End Date Taking? Authorizing  Provider  carvedilol (COREG) 3.125 MG tablet Take 3.125 mg by mouth 2 (two) times daily. 08/20/18   [provider]  citalopram (CELEXA) 20 MG tablet Take 20 mg by mouth every evening.     [provider]  hydrOXYzine (ATARAX/VISTARIL) 25 MG tablet Take 25 mg by mouth 3 (three) times daily as needed. for anxiety 08/20/18   [provider]  meclizine (ANTIVERT) 25 MG tablet Take 1 tablet (25 mg total) by mouth 3 (three) times daily as needed for dizziness. 11/09/18   Nat Christen, MD    Family History Family History  Problem Relation Age of Onset  . Congestive Heart Failure Mother   . Congestive Heart Failure Maternal Grandmother   . Cancer Paternal Grandmother        ovarian    Social History Social History   Tobacco Use  . Smoking status: Never Smoker  . Smokeless tobacco: Never Used  Substance Use Topics  . Alcohol use: Yes    Alcohol/week: 7.0 standard drinks    Types: 7 Cans of beer per week    Comment: 1 beer daily   . Drug use: No  Allergies   Patient has no known allergies.   Review of Systems Review of Systems  All other systems reviewed and are negative.    Physical Exam Updated Vital Signs BP (!) 168/91 (BP Location: Right Arm)   Pulse 70   Temp 97.9 F (36.6 C) (Oral)   Resp 16   Ht 5\' 7"  (1.702 m)   Wt 72.6 kg   LMP 02/09/2014   SpO2 100%   BMI 25.06 kg/m   Physical Exam Vitals signs and nursing note reviewed.  Constitutional:      Appearance: She is well-developed.  HENT:     Head: Normocephalic and atraumatic.  Eyes:     Conjunctiva/sclera: Conjunctivae normal.  Neck:     Musculoskeletal: Neck supple.  Cardiovascular:     Rate and Rhythm: Normal rate and regular rhythm.  Pulmonary:     Effort: Pulmonary effort is normal.     Breath sounds: Normal breath sounds.  Abdominal:     General: Bowel sounds are normal.     Palpations: Abdomen is soft.  Musculoskeletal: Normal range of motion.  Skin:    General:  Skin is warm and dry.  Neurological:     Mental Status: She is alert and oriented to person, place, and time.  Psychiatric:        Behavior: Behavior normal.      ED Treatments / Results  Labs (all labs ordered are listed, but only abnormal results are displayed) Labs Reviewed - No data to display  EKG None  Radiology No results found.  Procedures Procedures (including critical care time)  Medications Ordered in ED Medications - No data to display   Initial Impression / Assessment and Plan / ED Course  I have reviewed the triage vital signs and the nursing notes.  Pertinent labs & imaging results that were available during my care of the patient were reviewed by me and considered in my medical decision making (see chart for details).        Patient is afebrile and clinically stable.  No testing necessary.  She may return to work.  Final Clinical Impressions(s) / ED Diagnoses   Final diagnoses:  Nausea vomiting and diarrhea    ED Discharge Orders    None       Nat Christen, MD 03/07/19 [phone]

## 2019-05-25 ENCOUNTER — Encounter (HOSPITAL_COMMUNITY): Payer: Self-pay

## 2019-05-25 ENCOUNTER — Other Ambulatory Visit: Payer: Self-pay

## 2019-05-25 ENCOUNTER — Emergency Department (HOSPITAL_COMMUNITY): Payer: Managed Care, Other (non HMO)

## 2019-05-25 ENCOUNTER — Emergency Department (HOSPITAL_COMMUNITY)
Admission: EM | Admit: 2019-05-25 | Discharge: 2019-05-25 | Disposition: A | Discharge status: Home / Self Care | Payer: Managed Care, Other (non HMO) | Attending: Emergency Medicine | Admitting: Emergency Medicine

## 2019-05-25 DIAGNOSIS — Z79899 Other long term (current) drug therapy: Secondary | ICD-10-CM | POA: Insufficient documentation

## 2019-05-25 DIAGNOSIS — I10 Essential (primary) hypertension: Secondary | ICD-10-CM | POA: Diagnosis not present

## 2019-05-25 DIAGNOSIS — R072 Precordial pain: Secondary | ICD-10-CM | POA: Diagnosis not present

## 2019-05-25 DIAGNOSIS — R079 Chest pain, unspecified: Secondary | ICD-10-CM | POA: Diagnosis present

## 2019-05-25 LAB — CBC
HCT: 42.5 % (ref 36.0–46.0)
Hemoglobin: 13.9 g/dL (ref 12.0–15.0)
MCH: 28 pg (ref 26.0–34.0)
MCHC: 32.7 g/dL (ref 30.0–36.0)
MCV: 85.5 fL (ref 80.0–100.0)
Platelets: 238 10*3/uL (ref 150–400)
RBC: 4.97 MIL/uL (ref 3.87–5.11)
RDW: 14.1 % (ref 11.5–15.5)
WBC: 5.4 10*3/uL (ref 4.0–10.5)
nRBC: 0 % (ref 0.0–0.2)

## 2019-05-25 LAB — BASIC METABOLIC PANEL
Anion gap: 10 (ref 5–15)
BUN: 11 mg/dL (ref 6–20)
CO2: 22 mmol/L (ref 22–32)
Calcium: 9.2 mg/dL (ref 8.9–10.3)
Chloride: 106 mmol/L (ref 98–111)
Creatinine, Ser: 0.95 mg/dL (ref 0.44–1.00)
GFR calc Af Amer: >60 mL/min (ref >60)
GFR calc non Af Amer: >60 mL/min (ref >60)
Glucose, Bld: 100 mg/dL — ABNORMAL HIGH (ref 70–99)
Potassium: 3.8 mmol/L (ref 3.5–5.1)
Sodium: 138 mmol/L (ref 135–145)

## 2019-05-25 LAB — TROPONIN I: Troponin I: <0.03 ng/mL (ref <0.03)

## 2019-05-25 LAB — POC URINE PREG, ED: Preg Test, Ur: NEGATIVE

## 2019-05-25 MED ORDER — FAMOTIDINE 20 MG PO TABS
20.0000 mg | ORAL_TABLET | Freq: Once | ORAL | Status: AC
  Administered 2019-05-25: 17:00:00 20 mg via ORAL
  Filled 2019-05-25: qty 1

## 2019-05-25 MED ORDER — ALUM & MAG HYDROXIDE-SIMETH 200-200-20 MG/5ML PO SUSP
15.0000 mL | Freq: Once | ORAL | Status: AC
  Administered 2019-05-25: 15 mL via ORAL
  Filled 2019-05-25: qty 30

## 2019-05-25 MED ORDER — SODIUM CHLORIDE 0.9% FLUSH: 3.0000 mL | Freq: Once | INTRAVENOUS | Status: DC

## 2019-05-25 MED ORDER — ACETAMINOPHEN 500 MG PO TABS
1000.0000 mg | ORAL_TABLET | Freq: Once | ORAL | Status: AC
  Administered 2019-05-25: 1000 mg via ORAL
  Filled 2019-05-25: qty 2

## 2019-05-25 NOTE — ED Provider Notes (Signed)
[facility] EMERGENCY DEPARTMENT Provider Note   CSN: [id number] Arrival date & time: 05/25/19  1609    History   Chief Complaint Chief Complaint  Patient presents with   Chest Pain    HPI Vicki [name] is a 53 y.o. female.     Patient w hx htn, c/o midline/mid chest pain since yesterday. States was driving when onset pain. Pain constant, dull, moderate, non radiating, without specific exacerbating or alleviating factors. Denies associated nv, diaphoresis or sob. No unusual doe or fatigue. Denies any other recent chest pain, denies any exertional cp or discomfort. No pleuritic pain. Denies cough or uri symptoms. No chest wall injury or strain. No heartburn. Denies leg pain or swelling. No recent immobility, trauma, travel or surgery. No hx dvt or pe. Denies fam hx premature cad, although states her mother did have 'heart problems'.   The history is provided by the patient.  Chest Pain  Associated symptoms: no abdominal pain, no back pain, no cough, no fever, no headache, no nausea, no palpitations, no shortness of breath and no vomiting     Past Medical History:  Diagnosis Date   Abnormal Pap smear    Anemia    Anxiety    BV (bacterial vaginosis) 05/16/2013   Fibroids    uterine   Hypertension    Mental disorder    depression and panic attacks    Patient Active Problem List   Diagnosis Date Noted   S/P abdominal hysterectomy 02/25/2014   Depression 07/25/2013   Panic attacks 07/25/2013   Fibroids 05/16/2013   BV (bacterial vaginosis) 05/16/2013   Anemia 04/03/2012   Menorrhagia 04/03/2012   Hypokalemia 04/03/2012   UTI (lower urinary tract infection) 04/03/2012   Emesis 04/03/2012    Past Surgical History:  Procedure Laterality Date   ABDOMINAL HYSTERECTOMY     BILATERAL SALPINGECTOMY Bilateral 02/25/2014   Procedure: BILATERAL SALPINGECTOMY;  Surgeon: Florian Buff, MD;  Location: [facility];  Service: Gynecology;  Laterality: Bilateral;    DILATION AND CURETTAGE OF UTERUS     SUPRACERVICAL ABDOMINAL HYSTERECTOMY N/A 02/25/2014   Procedure: HYSTERECTOMY SUPRACERVICAL ABDOMINAL;  Surgeon: Florian Buff, MD;  Location: [facility];  Service: Gynecology;  Laterality: N/A;   TUBAL LIGATION       OB History    Gravida  7   Para  5   Term      Preterm      AB  2   Living  5     SAB  2   TAB      Ectopic      Multiple      Live Births               Home Medications    Prior to Admission medications   Medication Sig Start Date End Date Taking? Authorizing Provider  carvedilol (COREG) 3.125 MG tablet Take 3.125 mg by mouth 2 (two) times daily. 08/20/18   [provider]  citalopram (CELEXA) 20 MG tablet Take 20 mg by mouth every evening.     [provider]  hydrOXYzine (ATARAX/VISTARIL) 25 MG tablet Take 25 mg by mouth 3 (three) times daily as needed. for anxiety 08/20/18   [provider]  meclizine (ANTIVERT) 25 MG tablet Take 1 tablet (25 mg total) by mouth 3 (three) times daily as needed for dizziness. 11/09/18   Nat Christen, MD    Family History Family History  Problem Relation Age of Onset   Congestive Heart  Failure Mother    Congestive Heart Failure Maternal Grandmother    Cancer Paternal Grandmother        ovarian    Social History Social History   Tobacco Use   Smoking status: Never Smoker   Smokeless tobacco: Never Used  Substance Use Topics   Alcohol use: Yes    Alcohol/week: 7.0 standard drinks    Types: 7 Cans of beer per week    Comment: 1 beer daily    Drug use: No     Allergies   Patient has no known allergies.   Review of Systems Review of Systems  Constitutional: Negative for fever.  HENT: Negative for sore throat.   Eyes: Negative for redness.  Respiratory: Negative for cough and shortness of breath.   Cardiovascular: Positive for chest pain. Negative for palpitations and leg swelling.  Gastrointestinal: Negative for abdominal pain,  nausea and vomiting.  Genitourinary: Negative for flank pain.  Musculoskeletal: Negative for back pain and neck pain.  Skin: Negative for rash.  Neurological: Negative for syncope and headaches.  Hematological: Does not bruise/bleed easily.  Psychiatric/Behavioral: Negative for confusion.     Physical Exam Updated Vital Signs BP (!) 167/88 (BP Location: Right Arm)    Pulse 66    Temp 98.6 F (37 C) (Oral)    Resp 12    Ht 1.702 m (5\' 7" )    Wt 73 kg    LMP 02/09/2014    SpO2 99%    BMI 25.22 kg/m   Physical Exam Vitals signs and nursing note reviewed.  Constitutional:      Appearance: Normal appearance. She is well-developed.  HENT:     Head: Atraumatic.     Nose: Nose normal.     Mouth/Throat:     Mouth: Mucous membranes are moist.  Eyes:     General: No scleral icterus.    Conjunctiva/sclera: Conjunctivae normal.  Neck:     Musculoskeletal: Normal range of motion and neck supple. No neck rigidity or muscular tenderness.     Trachea: No tracheal deviation.  Cardiovascular:     Rate and Rhythm: Normal rate and regular rhythm.     Pulses: Normal pulses.     Heart sounds: Normal heart sounds. No murmur. No friction rub. No gallop.   Pulmonary:     Effort: Pulmonary effort is normal. No respiratory distress.     Breath sounds: Normal breath sounds.  Chest:     Chest wall: No tenderness.  Abdominal:     General: Bowel sounds are normal. There is no distension.     Palpations: Abdomen is soft.     Tenderness: There is no abdominal tenderness. There is no guarding.  Genitourinary:    Comments: No cva tenderness.  Musculoskeletal:        General: No swelling or tenderness.     Right lower leg: No edema.     Left lower leg: No edema.  Skin:    General: Skin is warm and dry.     Findings: No rash.  Neurological:     Mental Status: She is alert.     Comments: Alert, speech normal. Steady gait.   Psychiatric:        Mood and Affect: Mood normal.      ED Treatments /  Results  Labs (all labs ordered are listed, but only abnormal results are displayed) Results for orders placed or performed during the hospital encounter of 49/67/59  Basic metabolic panel  Result Value Ref  Range   Sodium 138 135 - 145 mmol/L   Potassium 3.8 3.5 - 5.1 mmol/L   Chloride 106 98 - 111 mmol/L   CO2 22 22 - 32 mmol/L   Glucose, Bld 100 (H) 70 - 99 mg/dL   BUN 11 6 - 20 mg/dL   Creatinine, Ser 0.95 0.44 - 1.00 mg/dL   Calcium 9.2 8.9 - 10.3 mg/dL   GFR calc non Af Amer >60 >60 mL/min   GFR calc Af Amer >60 >60 mL/min   Anion gap 10 5 - 15  CBC  Result Value Ref Range   WBC 5.4 4.0 - 10.5 K/uL   RBC 4.97 3.87 - 5.11 MIL/uL   Hemoglobin 13.9 12.0 - 15.0 g/dL   HCT 42.5 36.0 - 46.0 %   MCV 85.5 80.0 - 100.0 fL   MCH 28.0 26.0 - 34.0 pg   MCHC 32.7 30.0 - 36.0 g/dL   RDW 14.1 11.5 - 15.5 %   Platelets 238 150 - 400 K/uL   nRBC 0.0 0.0 - 0.2 %  Troponin I - ONCE - STAT  Result Value Ref Range   Troponin I <0.03 <0.03 ng/mL   Dg Chest 2 View  Result Date: 05/25/2019 CLINICAL DATA:  Chest pain.  Shortness of breath. EXAM: CHEST - 2 VIEW COMPARISON:  September 23, 2018 FINDINGS: The heart size and mediastinal contours are within normal limits. Both lungs are clear. The visualized skeletal structures are unremarkable. IMPRESSION: No active cardiopulmonary disease. Electronically Signed   By: Dorise Bullion III M.D   On: 05/25/2019 16:54    EKG EKG Interpretation  Date/Time:  Sunday May 25 2019 16:23:56 EDT Ventricular Rate:  68 PR Interval:    QRS Duration: 78 QT Interval:  420 QTC Calculation: 447 R Axis:   36 Text Interpretation:  Sinus rhythm Nonspecific ST abnormality No significant change since last tracing Confirmed by Lajean Saver [phone]) on 05/25/2019 4:31:29 PM   Radiology Dg Chest 2 View  Result Date: 05/25/2019 CLINICAL DATA:  Chest pain.  Shortness of breath. EXAM: CHEST - 2 VIEW COMPARISON:  September 23, 2018 FINDINGS: The heart size and  mediastinal contours are within normal limits. Both lungs are clear. The visualized skeletal structures are unremarkable. IMPRESSION: No active cardiopulmonary disease. Electronically Signed   By: Dorise Bullion III M.D   On: 05/25/2019 16:54    Procedures Procedures (including critical care time)  Medications Ordered in ED Medications  sodium chloride flush (NS) 0.9 % injection 3 mL (has no administration in time range)  acetaminophen (TYLENOL) tablet 1,000 mg (has no administration in time range)  famotidine (PEPCID) tablet 20 mg (has no administration in time range)  alum & mag hydroxide-simeth (MAALOX/MYLANTA) 200-200-20 MG/5ML suspension 15 mL (has no administration in time range)     Initial Impression / Assessment and Plan / ED Course  I have reviewed the triage vital signs and the nursing notes.  Pertinent labs & imaging results that were available during my care of the patient were reviewed by me and considered in my medical decision making (see chart for details).  Iv ns. Ecg. Cxr. Labs.   Reviewed nursing notes and prior charts for additional history.   Pepcid, maalox, and acetaminophen given for symptom relief.  ecg appears very similar to old ecgs - no acute/new st/t changes.   Labs reviewed by me - after symptoms present/constant for past 24 hours, trop is normal/negative. Symptoms/eval felt not c/w ACS.  CXR reviewed by me -  no pna.   Recheck pt comfortable, no pain or distress. No increased wob. rr 12-14. Pulse ox 99%. Afebrile.  Patient currently appears stable for d/c.   Given fam hx, no prior cardiac eval, will refer to close outpt cardiology f/u.  Return precautions provided.     Final Clinical Impressions(s) / ED Diagnoses   Final diagnoses:  None    ED Discharge Orders    None       Lajean Saver, MD 05/25/19 1704

## 2019-05-25 NOTE — ED Triage Notes (Signed)
Pt presents to ED with complaints of mid chest pain which started yesterday. Pt states it is accompanied with SOB.

## 2019-05-25 NOTE — Discharge Instructions (Addendum)
It was our pleasure to provide your ER care today - we hope that you feel better.  Your blood pressure is high today - continue your blood pressure medication, limit salt intake, and follow up with primary care doctor in 1-2 weeks.   For recent chest pain, follow up with cardiologist in the next 1-2 weeks - call office tomorrow AM to arrange appointment.   Return to ER right away if worse, new symptoms, recurrent/persistent chest pain, increased trouble breathing, other concern.

## 2019-05-28 ENCOUNTER — Encounter: Payer: Self-pay | Admitting: Gastroenterology

## 2019-06-12 ENCOUNTER — Encounter: Payer: Self-pay | Admitting: Orthopaedic Surgery

## 2019-06-12 ENCOUNTER — Ambulatory Visit: Payer: Managed Care, Other (non HMO) | Admitting: Orthopaedic Surgery

## 2019-06-12 ENCOUNTER — Other Ambulatory Visit: Payer: Self-pay

## 2019-06-12 VITALS — BP 163/88 | HR 79 | Temp 97.3°F | Ht 67.0 in | Wt 157.0 lb

## 2019-06-12 DIAGNOSIS — M65311 Trigger thumb, right thumb: Secondary | ICD-10-CM

## 2019-06-12 NOTE — Progress Notes (Signed)
Subjective:    Patient ID: Vicki [name], female    DOB: [birth year], 53 y.o.   MRN: [id number]  HPI She has had locking of the right thumb getting worse over the last two to three months. She has been seen at [facility] and I have reviewed their notes.  She has more pain in the morning and has to use her left hand to extend the right thumb.  She has no numbness, no trauma.  It is interfering with her work.    Review of Systems  Constitutional: Positive for activity change.  Musculoskeletal: Positive for arthralgias.  Psychiatric/Behavioral: The patient is nervous/anxious.   All other systems reviewed and are negative.  For Review of Systems, all other systems reviewed and are negative.  The following is a summary of the past history medically, past history surgically, known current medicines, social history and family history.  This information is gathered electronically by the computer from prior information and documentation.  I review this each visit and have found including this information at this point in the chart is beneficial and informative.   Past Medical History:  Diagnosis Date  . Abnormal Pap smear   . Anemia   . Anxiety   . BV (bacterial vaginosis) 05/16/2013  . Fibroids    uterine  . Hypertension   . Mental disorder    depression and panic attacks    Past Surgical History:  Procedure Laterality Date  . ABDOMINAL HYSTERECTOMY    . BILATERAL SALPINGECTOMY Bilateral 02/25/2014   Procedure: BILATERAL SALPINGECTOMY;  Surgeon: Florian Buff, MD;  Location: [facility];  Service: Gynecology;  Laterality: Bilateral;  . DILATION AND CURETTAGE OF UTERUS    . SUPRACERVICAL ABDOMINAL HYSTERECTOMY N/A 02/25/2014   Procedure: HYSTERECTOMY SUPRACERVICAL ABDOMINAL;  Surgeon: Florian Buff, MD;  Location: [facility];  Service: Gynecology;  Laterality: N/A;  . TUBAL LIGATION      Current Outpatient Medications on File Prior to Visit  Medication Sig Dispense Refill  . carvedilol  (COREG) 3.125 MG tablet Take 3.125 mg by mouth 2 (two) times daily.  6  . citalopram (CELEXA) 20 MG tablet Take 20 mg by mouth every evening.     . hydrOXYzine (ATARAX/VISTARIL) 25 MG tablet Take 25 mg by mouth 3 (three) times daily as needed. for anxiety  6  . meclizine (ANTIVERT) 25 MG tablet Take 1 tablet (25 mg total) by mouth 3 (three) times daily as needed for dizziness. 15 tablet 0  . Vitamin D, Ergocalciferol, (DRISDOL) 1.25 MG (50000 UT) CAPS capsule TAKE 1 CAPSULE BY MOUTH ONCE A WEEK FOR LOW VITAMIN D     No current facility-administered medications on file prior to visit.     Social History   Socioeconomic History  . Marital status: Legally Separated    Spouse name: Not on file  . Number of children: Not on file  . Years of education: Not on file  . Highest education level: Not on file  Occupational History  . Not on file  Social Needs  . Financial resource strain: Not on file  . Food insecurity    Worry: Not on file    Inability: Not on file  . Transportation needs    Medical: Not on file    Non-medical: Not on file  Tobacco Use  . Smoking status: Never Smoker  . Smokeless tobacco: Never Used  Substance and Sexual Activity  . Alcohol use: Yes    Alcohol/week: 7.0 standard drinks  Types: 7 Cans of beer per week    Comment: 1 beer daily   . Drug use: No  . Sexual activity: Not Currently    Birth control/protection: Surgical  Lifestyle  . Physical activity    Days per week: Not on file    Minutes per session: Not on file  . Stress: Not on file  Relationships  . Social Herbalist on phone: Not on file    Gets together: Not on file    Attends religious service: Not on file    Active member of club or organization: Not on file    Attends meetings of clubs or organizations: Not on file    Relationship status: Not on file  . Intimate partner violence    Fear of current or ex partner: Not on file    Emotionally abused: Not on file    Physically  abused: Not on file    Forced sexual activity: Not on file  Other Topics Concern  . Not on file  Social History Narrative  . Not on file    Family History  Problem Relation Age of Onset  . Congestive Heart Failure Mother   . Congestive Heart Failure Maternal Grandmother   . Cancer Paternal Grandmother        ovarian    BP (!) 163/88   Pulse 79   Temp (!) 97.3 F (36.3 C)   Ht 5\' 7"  (1.702 m)   Wt 157 lb (71.2 kg)   LMP 02/09/2014   BMI 24.59 kg/m   Body mass index is 24.59 kg/m.      Objective:   Physical Exam Vitals signs reviewed.  Constitutional:      Appearance: She is well-developed.  HENT:     Head: Normocephalic and atraumatic.  Eyes:     Conjunctiva/sclera: Conjunctivae normal.     Pupils: Pupils are equal, round, and reactive to light.  Neck:     Musculoskeletal: Normal range of motion and neck supple.  Cardiovascular:     Rate and Rhythm: Normal rate and regular rhythm.  Pulmonary:     Effort: Pulmonary effort is normal.  Abdominal:     Palpations: Abdomen is soft.  Musculoskeletal:       Hands:  Skin:    General: Skin is warm and dry.  Neurological:     Mental Status: She is alert and oriented to person, place, and time.     Cranial Nerves: No cranial nerve deficit.     Motor: No abnormal muscle tone.     Coordination: Coordination normal.     Deep Tendon Reflexes: Reflexes are normal and symmetric. Reflexes normal.  Psychiatric:        Behavior: Behavior normal.        Thought Content: Thought content normal.        Judgment: Judgment normal.           Assessment & Plan:   Encounter Diagnosis  Name Primary?  . Trigger thumb of right hand Yes   Procedure note: After permission from the patient the right thumb A1 pulley area was injected with 1% plain Xylocaine and 1 cc of DepoMedrol 40 by sterile technique tolerated well.  Return in two weeks.  She may need surgery which is the definitive treatment. She is aware.  Call if  any problem.  Precautions discussed.  Electronically Signed Sanjuana Kava, MD 6/18/20208:52 AM

## 2019-06-26 ENCOUNTER — Ambulatory Visit (INDEPENDENT_AMBULATORY_CARE_PROVIDER_SITE_OTHER): Payer: Managed Care, Other (non HMO) | Admitting: Orthopaedic Surgery

## 2019-06-26 ENCOUNTER — Encounter: Payer: Self-pay | Admitting: Orthopaedic Surgery

## 2019-06-26 ENCOUNTER — Other Ambulatory Visit: Payer: Self-pay

## 2019-06-26 VITALS — BP 159/98 | HR 76 | Temp 98.0°F | Ht 67.0 in | Wt 158.0 lb

## 2019-06-26 DIAGNOSIS — M65311 Trigger thumb, right thumb: Secondary | ICD-10-CM

## 2019-06-26 NOTE — Progress Notes (Signed)
Patient IO:Vicki [name], [name] DOB:06-10-1966, 53 y.o. WUX:[id number]  Chief Complaint  Patient presents with  . Hand Pain    Rt hand thumb much better after injection    HPI  Vicki [name] is a 53 y.o. [name] has had triggering of the right thumb.  She is better after the injection.  She still has a little twinge of popping now and then but no locking.  She has no redness and no swelling.   Body mass index is 24.75 kg/m.  ROS  Review of Systems  Constitutional: Positive for activity change.  Musculoskeletal: Positive for arthralgias.  Neurological: Headaches: .wkx.  Psychiatric/Behavioral: The patient is nervous/anxious.   All other systems reviewed and are negative.   All other systems reviewed and are negative.  The following is a summary of the past history medically, past history surgically, known current medicines, social history and family history.  This information is gathered electronically by the computer from prior information and documentation.  I review this each visit and have found including this information at this point in the chart is beneficial and informative.    Past Medical History:  Diagnosis Date  . Abnormal Pap smear   . Anemia   . Anxiety   . BV (bacterial vaginosis) 05/16/2013  . Fibroids    uterine  . Hypertension   . Mental disorder    depression and panic attacks    Past Surgical History:  Procedure Laterality Date  . ABDOMINAL HYSTERECTOMY    . BILATERAL SALPINGECTOMY Bilateral 02/25/2014   Procedure: BILATERAL SALPINGECTOMY;  Surgeon: Florian Buff, MD;  Location: [facility];  Service: Gynecology;  Laterality: Bilateral;  . DILATION AND CURETTAGE OF UTERUS    . SUPRACERVICAL ABDOMINAL HYSTERECTOMY N/A 02/25/2014   Procedure: HYSTERECTOMY SUPRACERVICAL ABDOMINAL;  Surgeon: Florian Buff, MD;  Location: [facility];  Service: Gynecology;  Laterality: N/A;  . TUBAL LIGATION      Family History  Problem Relation Age of Onset  . Congestive  Heart Failure Mother   . Congestive Heart Failure Maternal Grandmother   . Cancer Paternal Grandmother        ovarian    Social History Social History   Tobacco Use  . Smoking status: Never Smoker  . Smokeless tobacco: Never Used  Substance Use Topics  . Alcohol use: Yes    Alcohol/week: 7.0 standard drinks    Types: 7 Cans of beer per week    Comment: 1 beer daily   . Drug use: No    No Known Allergies  Current Outpatient Medications  Medication Sig Dispense Refill  . carvedilol (COREG) 3.125 MG tablet Take 3.125 mg by mouth 2 (two) times daily.  6  . citalopram (CELEXA) 20 MG tablet Take 20 mg by mouth every evening.     . hydrOXYzine (ATARAX/VISTARIL) 25 MG tablet Take 25 mg by mouth 3 (three) times daily as needed. for anxiety  6  . meclizine (ANTIVERT) 25 MG tablet Take 1 tablet (25 mg total) by mouth 3 (three) times daily as needed for dizziness. 15 tablet 0  . Vitamin D, Ergocalciferol, (DRISDOL) 1.25 MG (50000 UT) CAPS capsule TAKE 1 CAPSULE BY MOUTH ONCE A WEEK FOR LOW VITAMIN D     No current facility-administered medications for this visit.      Physical Exam  Blood pressure (!) 159/98, pulse 76, height 5\' 7"  (1.702 m), weight 158 lb (71.7 kg), last menstrual period 02/09/2014.  Constitutional: overall normal hygiene, normal nutrition,  well developed, normal grooming, normal body habitus. Assistive device:none  Musculoskeletal: gait and station Limp none, muscle tone and strength are normal, no tremors or atrophy is present.  .  Neurological: coordination overall normal.  Deep tendon reflex/nerve stretch intact.  Sensation normal.  Cranial nerves II-XII intact.   Skin:   Normal overall no scars, lesions, ulcers or rashes. No psoriasis.  Psychiatric: Alert and oriented x 3.  Recent memory intact, remote memory unclear.  Normal mood and affect. Well groomed.  Good eye contact.  Cardiovascular: overall no swelling, no varicosities, no edema bilaterally, normal  temperatures of the legs and arms, no clubbing, cyanosis and good capillary refill.  Lymphatic: palpation is normal.  She has no triggering of the right thumb today.  NV intact.  All other systems reviewed and are negative   The patient has been educated about the nature of the problem(s) and counseled on treatment options.  The patient appeared to understand what I have discussed and is in agreement with it.  Encounter Diagnosis  Name Primary?  . Trigger thumb of right hand Yes    PLAN Call if any problems.  Precautions discussed.  Continue current medications.   Return to clinic as needed   Electronically Signed Vicki Kava, MD 7/2/20209:01 AM

## 2019-06-27 ENCOUNTER — Emergency Department (HOSPITAL_COMMUNITY): Payer: Managed Care, Other (non HMO)

## 2019-06-27 ENCOUNTER — Emergency Department (HOSPITAL_COMMUNITY)
Admission: EM | Admit: 2019-06-27 | Discharge: 2019-06-27 | Disposition: A | Discharge status: Home / Self Care | Payer: Managed Care, Other (non HMO) | Attending: Emergency Medicine | Admitting: Emergency Medicine

## 2019-06-27 ENCOUNTER — Other Ambulatory Visit: Payer: Self-pay

## 2019-06-27 ENCOUNTER — Encounter (HOSPITAL_COMMUNITY): Payer: Self-pay | Admitting: Emergency Medicine

## 2019-06-27 DIAGNOSIS — R51 Headache: Secondary | ICD-10-CM | POA: Insufficient documentation

## 2019-06-27 DIAGNOSIS — I10 Essential (primary) hypertension: Secondary | ICD-10-CM

## 2019-06-27 DIAGNOSIS — R519 Headache, unspecified: Secondary | ICD-10-CM

## 2019-06-27 DIAGNOSIS — Z79899 Other long term (current) drug therapy: Secondary | ICD-10-CM | POA: Insufficient documentation

## 2019-06-27 LAB — CBC
HCT: 45.1 % (ref 36.0–46.0)
Hemoglobin: 14.6 g/dL (ref 12.0–15.0)
MCH: 28.1 pg (ref 26.0–34.0)
MCHC: 32.4 g/dL (ref 30.0–36.0)
MCV: 86.9 fL (ref 80.0–100.0)
Platelets: 274 10*3/uL (ref 150–400)
RBC: 5.19 MIL/uL — ABNORMAL HIGH (ref 3.87–5.11)
RDW: 14.3 % (ref 11.5–15.5)
WBC: 7 10*3/uL (ref 4.0–10.5)
nRBC: 0 % (ref 0.0–0.2)

## 2019-06-27 LAB — BASIC METABOLIC PANEL
Anion gap: 11 (ref 5–15)
BUN: 13 mg/dL (ref 6–20)
CO2: 23 mmol/L (ref 22–32)
Calcium: 9.5 mg/dL (ref 8.9–10.3)
Chloride: 107 mmol/L (ref 98–111)
Creatinine, Ser: 1.02 mg/dL — ABNORMAL HIGH (ref 0.44–1.00)
GFR calc Af Amer: >60 mL/min (ref >60)
GFR calc non Af Amer: >60 mL/min (ref >60)
Glucose, Bld: 93 mg/dL (ref 70–99)
Potassium: 3.8 mmol/L (ref 3.5–5.1)
Sodium: 141 mmol/L (ref 135–145)

## 2019-06-27 MED ORDER — SODIUM CHLORIDE 0.9 % IV SOLN
INTRAVENOUS | Status: DC
  Administered 2019-06-27: 08:00:00 via INTRAVENOUS

## 2019-06-27 MED ORDER — ONDANSETRON HCL 4 MG/2ML IJ SOLN
4.0000 mg | Freq: Once | INTRAMUSCULAR | Status: AC
  Administered 2019-06-27: 08:00:00 4 mg via INTRAVENOUS
  Filled 2019-06-27: qty 2

## 2019-06-27 MED ORDER — HYDROMORPHONE HCL 1 MG/ML IJ SOLN
1.0000 mg | Freq: Once | INTRAMUSCULAR | Status: AC
  Administered 2019-06-27: 1 mg via INTRAVENOUS
  Filled 2019-06-27: qty 1

## 2019-06-27 NOTE — ED Triage Notes (Signed)
Pt c/o headache, hypertension, and blurry vision that began this morning. Pt states BP was 180/90 this morning. She takes blood pressure medication daily and took her dose this morning. Denies any other neurological deficits.

## 2019-06-27 NOTE — ED Provider Notes (Signed)
[facility] EMERGENCY DEPARTMENT Provider Note   CSN: [id number] Arrival date & time: 06/27/19  7829     History   Chief Complaint Chief Complaint  Patient presents with  . Hypertension    HPI Vicki [name] is a 53 y.o. female.     Patient with known history of hypertension.  Is taking Coreg.  Followed by Dr. Maudie Mercury.  Patient woke up at 430 this morning with a headache to the back of the head.  Patient does not have a history of migraines.  But states she does get headaches when her blood pressure is high.  Also associated with some slightly blurred vision but no significant vision change.  No nausea no vomiting no chest pain no shortness of breath no fever no cough.  No weakness or numbness to arms and legs.  Blood pressure on arrival was 170/88.  Patient did have her morning dose of Coreg.     Past Medical History:  Diagnosis Date  . Abnormal Pap smear   . Anemia   . Anxiety   . BV (bacterial vaginosis) 05/16/2013  . Fibroids    uterine  . Hypertension   . Mental disorder    depression and panic attacks    Patient Active Problem List   Diagnosis Date Noted  . S/P abdominal hysterectomy 02/25/2014  . Depression 07/25/2013  . Panic attacks 07/25/2013  . Fibroids 05/16/2013  . BV (bacterial vaginosis) 05/16/2013  . Anemia 04/03/2012  . Menorrhagia 04/03/2012  . Hypokalemia 04/03/2012  . UTI (lower urinary tract infection) 04/03/2012  . Emesis 04/03/2012    Past Surgical History:  Procedure Laterality Date  . ABDOMINAL HYSTERECTOMY    . BILATERAL SALPINGECTOMY Bilateral 02/25/2014   Procedure: BILATERAL SALPINGECTOMY;  Surgeon: Florian Buff, MD;  Location: [facility];  Service: Gynecology;  Laterality: Bilateral;  . DILATION AND CURETTAGE OF UTERUS    . SUPRACERVICAL ABDOMINAL HYSTERECTOMY N/A 02/25/2014   Procedure: HYSTERECTOMY SUPRACERVICAL ABDOMINAL;  Surgeon: Florian Buff, MD;  Location: [facility];  Service: Gynecology;  Laterality: N/A;  . TUBAL LIGATION        OB History    Gravida  7   Para  5   Term      Preterm      AB  2   Living  5     SAB  2   TAB      Ectopic      Multiple      Live Births               Home Medications    Prior to Admission medications   Medication Sig Start Date End Date Taking? Authorizing Provider  carvedilol (COREG) 3.125 MG tablet Take 3.125 mg by mouth 2 (two) times daily. 08/20/18  Yes [provider]  citalopram (CELEXA) 20 MG tablet Take 20 mg by mouth every evening.    Yes [provider]  hydrOXYzine (ATARAX/VISTARIL) 25 MG tablet Take 25 mg by mouth 3 (three) times daily as needed. for anxiety 08/20/18  Yes [provider]  meclizine (ANTIVERT) 25 MG tablet Take 1 tablet (25 mg total) by mouth 3 (three) times daily as needed for dizziness. 11/09/18  Yes Nat Christen, MD  Vitamin D, Ergocalciferol, (DRISDOL) 1.25 MG (50000 UT) CAPS capsule TAKE 1 CAPSULE BY MOUTH ONCE A WEEK FOR LOW VITAMIN D 04/22/19  Yes [provider]    Family History Family History  Problem Relation Age of Onset  .  Congestive Heart Failure Mother   . Congestive Heart Failure Maternal Grandmother   . Cancer Paternal Grandmother        ovarian    Social History Social History   Tobacco Use  . Smoking status: Never Smoker  . Smokeless tobacco: Never Used  Substance Use Topics  . Alcohol use: Yes    Alcohol/week: 7.0 standard drinks    Types: 7 Cans of beer per week    Comment: 1 beer daily   . Drug use: No     Allergies   Patient has no known allergies.   Review of Systems Review of Systems  Constitutional: Negative for chills and fever.  HENT: Negative for congestion, rhinorrhea and sore throat.   Eyes: Positive for visual disturbance.  Respiratory: Negative for cough and shortness of breath.   Cardiovascular: Negative for chest pain and leg swelling.  Gastrointestinal: Negative for abdominal pain, diarrhea, nausea and vomiting.  Genitourinary: Negative for  dysuria.  Musculoskeletal: Negative for back pain and neck pain.  Skin: Negative for rash.  Neurological: Positive for headaches. Negative for dizziness and light-headedness.  Hematological: Does not bruise/bleed easily.  Psychiatric/Behavioral: Negative for confusion.     Physical Exam Updated Vital Signs BP (!) 170/86   Pulse (!) 56   Temp 98.7 F (37.1 C) (Oral)   Resp 16   Ht 1.702 m (5\' 7" )   Wt 73 kg   LMP 02/09/2014   SpO2 97%   BMI 25.22 kg/m   Physical Exam Vitals signs and nursing note reviewed.  Constitutional:      General: She is not in acute distress.    Appearance: Normal appearance. She is well-developed.  HENT:     Head: Normocephalic and atraumatic.  Eyes:     Extraocular Movements: Extraocular movements intact.     Conjunctiva/sclera: Conjunctivae normal.     Pupils: Pupils are equal, round, and reactive to light.  Neck:     Musculoskeletal: Normal range of motion and neck supple.  Cardiovascular:     Rate and Rhythm: Normal rate and regular rhythm.     Heart sounds: No murmur.  Pulmonary:     Effort: Pulmonary effort is normal. No respiratory distress.     Breath sounds: Normal breath sounds.  Abdominal:     Palpations: Abdomen is soft.     Tenderness: There is no abdominal tenderness.  Musculoskeletal: Normal range of motion.        General: No swelling.  Skin:    General: Skin is warm and dry.  Neurological:     General: No focal deficit present.     Mental Status: She is alert and oriented to person, place, and time.     Cranial Nerves: No cranial nerve deficit.     Sensory: No sensory deficit.     Motor: No weakness.      ED Treatments / Results  Labs (all labs ordered are listed, but only abnormal results are displayed) Labs Reviewed  CBC - Abnormal; Notable for the following components:      Result Value   RBC 5.19 (*)    All other components within normal limits  BASIC METABOLIC PANEL - Abnormal; Notable for the following  components:   Creatinine, Ser 1.02 (*)    All other components within normal limits    EKG EKG Interpretation  Date/Time:  Friday June 27 2019 07:58:04 EDT Ventricular Rate:  61 PR Interval:    QRS Duration: 86 QT Interval:  440 QTC  Calculation: 444 R Axis:   46 Text Interpretation:  Sinus rhythm Confirmed by Fredia Sorrow [phone]) on 06/27/2019 8:02:04 AM   Radiology Ct Head Wo Contrast  Result Date: 06/27/2019 CLINICAL DATA:  Headache and blurry vision. EXAM: CT HEAD WITHOUT CONTRAST TECHNIQUE: Contiguous axial images were obtained from the base of the skull through the vertex without intravenous contrast. COMPARISON:  CT head dated November 09, 2018. FINDINGS: Brain: No evidence of acute infarction, hemorrhage, hydrocephalus, extra-axial collection or mass lesion/mass effect. Vascular: No hyperdense vessel or unexpected calcification. Skull: Normal. Negative for fracture or focal lesion. Sinuses/Orbits: No acute finding. Other: None. IMPRESSION: 1. Normal noncontrast head CT. Electronically Signed   By: Titus Dubin M.D.   On: 06/27/2019 09:03    Procedures Procedures (including critical care time)  Medications Ordered in ED Medications  0.9 %  sodium chloride infusion ( Intravenous New Bag/Given 06/27/19 0824)  ondansetron (ZOFRAN) injection 4 mg (4 mg Intravenous Given 06/27/19 0825)  HYDROmorphone (DILAUDID) injection 1 mg (1 mg Intravenous Given 06/27/19 0839)     Initial Impression / Assessment and Plan / ED Course  I have reviewed the triage vital signs and the nursing notes.  Pertinent labs & imaging results that were available during my care of the patient were reviewed by me and considered in my medical decision making (see chart for details).       Patient with known history of hypertension.  Followed by Dr. Maudie Mercury.  Patient has been taking her hypertensive medicine which is Coreg.  Patient's blood pressure did improve some here with pain medication for the headache.   Actually got systolics down into the 630 range.  But they have been more consistently now 160.  Based on this trend patient may very well require adjustment of her blood pressure medicines.  Labs and head CT without any acute findings.  We will have patient follow back up with primary care doctor and can continue her Coreg and begin a log of her blood pressures.  Patient nontoxic no acute distress.  No focal deficit.   Final Clinical Impressions(s) / ED Diagnoses   Final diagnoses:  Essential hypertension  Headache disorder    ED Discharge Orders    None       Fredia Sorrow, MD 06/27/19 1104

## 2019-06-27 NOTE — Discharge Instructions (Addendum)
Make an appointment to follow-up with Dr. Maudie Mercury.  Would recommend keeping a logbook of your blood pressures daily if you are able to check your blood pressure at home.  Check it usually in early afternoon and just record the number this will help him adjust your blood pressure.  Based on today's readings you probably need some adjustment in your medications but we want to see if there is a trend of it being high.  Head CT was negative labs without any significant abnormalities.  Return for any new or worse symptoms to include any strokelike symptoms severe headache severe chest pain difficulty breathing.

## 2019-07-18 ENCOUNTER — Encounter (HOSPITAL_COMMUNITY): Payer: Self-pay | Admitting: Emergency Medicine

## 2019-07-18 ENCOUNTER — Emergency Department (HOSPITAL_COMMUNITY)
Admission: EM | Admit: 2019-07-18 | Discharge: 2019-07-18 | Disposition: A | Discharge status: Home / Self Care | Payer: Managed Care, Other (non HMO) | Attending: Emergency Medicine | Admitting: Emergency Medicine

## 2019-07-18 ENCOUNTER — Other Ambulatory Visit: Payer: Self-pay

## 2019-07-18 DIAGNOSIS — I10 Essential (primary) hypertension: Secondary | ICD-10-CM | POA: Insufficient documentation

## 2019-07-18 DIAGNOSIS — Z79899 Other long term (current) drug therapy: Secondary | ICD-10-CM | POA: Insufficient documentation

## 2019-07-18 DIAGNOSIS — R21 Rash and other nonspecific skin eruption: Secondary | ICD-10-CM | POA: Diagnosis not present

## 2019-07-18 MED ORDER — PREDNISONE 20 MG PO TABS: ORAL_TABLET | ORAL | 0 refills | Status: DC

## 2019-07-18 MED ORDER — FAMOTIDINE 20 MG PO TABS: 20.0000 mg | ORAL_TABLET | Freq: Two times a day (BID) | ORAL | 0 refills | Status: DC

## 2019-07-18 MED ORDER — FAMOTIDINE 20 MG PO TABS
20.0000 mg | ORAL_TABLET | Freq: Once | ORAL | Status: AC
  Administered 2019-07-18: 20 mg via ORAL
  Filled 2019-07-18: qty 1

## 2019-07-18 MED ORDER — PREDNISONE 20 MG PO TABS
40.0000 mg | ORAL_TABLET | Freq: Once | ORAL | Status: AC
  Administered 2019-07-18: 40 mg via ORAL
  Filled 2019-07-18: qty 2

## 2019-07-18 NOTE — ED Triage Notes (Signed)
Pt reports during triage that she let a friend wash her clothes and she used Gain detergent and pt says she is sensitive to Gain.

## 2019-07-18 NOTE — Discharge Instructions (Addendum)
Take Benadryl for itching and rash pain          Take Benadryl 25 mg every 6 hours for itching or rash.  Follow-up with your doctor next week if not improving

## 2019-07-18 NOTE — ED Provider Notes (Signed)
[facility] EMERGENCY DEPARTMENT Provider Note   CSN: [id number] Arrival date & time: 07/18/19  5462     History   Chief Complaint Chief Complaint  Patient presents with  . Rash    HPI Vicki [name] is a 53 y.o. female.     Patient complains of a rash on her arms and legs.  It is pruritic it started a few days ago she does not know what caused  The history is provided by the patient. No language interpreter was used.  Rash Location:  Full body Quality: draining   Severity:  Mild Onset quality:  Sudden Timing:  Constant Progression:  Unchanged Chronicity:  New Context: not animal contact   Relieved by:  Nothing Worsened by:  Nothing Associated symptoms: no abdominal pain, no diarrhea, no fatigue and no headaches     Past Medical History:  Diagnosis Date  . Abnormal Pap smear   . Anemia   . Anxiety   . BV (bacterial vaginosis) 05/16/2013  . Fibroids    uterine  . Hypertension   . Mental disorder    depression and panic attacks    Patient Active Problem List   Diagnosis Date Noted  . S/P abdominal hysterectomy 02/25/2014  . Depression 07/25/2013  . Panic attacks 07/25/2013  . Fibroids 05/16/2013  . BV (bacterial vaginosis) 05/16/2013  . Anemia 04/03/2012  . Menorrhagia 04/03/2012  . Hypokalemia 04/03/2012  . UTI (lower urinary tract infection) 04/03/2012  . Emesis 04/03/2012    Past Surgical History:  Procedure Laterality Date  . ABDOMINAL HYSTERECTOMY    . BILATERAL SALPINGECTOMY Bilateral 02/25/2014   Procedure: BILATERAL SALPINGECTOMY;  Surgeon: Florian Buff, MD;  Location: [facility];  Service: Gynecology;  Laterality: Bilateral;  . DILATION AND CURETTAGE OF UTERUS    . SUPRACERVICAL ABDOMINAL HYSTERECTOMY N/A 02/25/2014   Procedure: HYSTERECTOMY SUPRACERVICAL ABDOMINAL;  Surgeon: Florian Buff, MD;  Location: [facility];  Service: Gynecology;  Laterality: N/A;  . TUBAL LIGATION       OB History    Gravida  7   Para  5   Term      Preterm       AB  2   Living  5     SAB  2   TAB      Ectopic      Multiple      Live Births               Home Medications    Prior to Admission medications   Medication Sig Start Date End Date Taking? Authorizing Provider  carvedilol (COREG) 3.125 MG tablet Take 3.125 mg by mouth 2 (two) times daily. 08/20/18   [provider]  citalopram (CELEXA) 20 MG tablet Take 20 mg by mouth every evening.     [provider]  famotidine (PEPCID) 20 MG tablet Take 1 tablet (20 mg total) by mouth 2 (two) times daily. 07/18/19   Milton Ferguson, MD  hydrOXYzine (ATARAX/VISTARIL) 25 MG tablet Take 25 mg by mouth 3 (three) times daily as needed. for anxiety 08/20/18   [provider]  meclizine (ANTIVERT) 25 MG tablet Take 1 tablet (25 mg total) by mouth 3 (three) times daily as needed for dizziness. 11/09/18   Nat Christen, MD  predniSONE (DELTASONE) 20 MG tablet 2 tabs po daily x 3 days 07/18/19   Milton Ferguson, MD  Vitamin D, Ergocalciferol, (DRISDOL) 1.25 MG (50000 UT) CAPS capsule TAKE 1 CAPSULE BY MOUTH ONCE A  WEEK FOR LOW VITAMIN D 04/22/19   [provider]    Family History Family History  Problem Relation Age of Onset  . Congestive Heart Failure Mother   . Congestive Heart Failure Maternal Grandmother   . Cancer Paternal Grandmother        ovarian    Social History Social History   Tobacco Use  . Smoking status: Never Smoker  . Smokeless tobacco: Never Used  Substance Use Topics  . Alcohol use: Yes    Alcohol/week: 7.0 standard drinks    Types: 7 Cans of beer per week    Comment: 1 beer daily   . Drug use: No     Allergies   Patient has no known allergies.   Review of Systems Review of Systems  Constitutional: Negative for appetite change and fatigue.  HENT: Negative for congestion, ear discharge and sinus pressure.   Eyes: Negative for discharge.  Respiratory: Negative for cough.   Cardiovascular: Negative for chest pain.   Gastrointestinal: Negative for abdominal pain and diarrhea.  Genitourinary: Negative for frequency and hematuria.  Musculoskeletal: Negative for back pain.  Skin: Positive for rash.  Neurological: Negative for seizures and headaches.  Psychiatric/Behavioral: Negative for hallucinations.     Physical Exam Updated Vital Signs BP (!) 154/99 (BP Location: Left Arm)   Pulse 67   Temp 98.3 F (36.8 C) (Oral)   Resp 16   Ht 5\' 7"  (1.702 m)   Wt 73 kg   LMP 02/09/2014   SpO2 99%   BMI 25.22 kg/m   Physical Exam Vitals signs and nursing note reviewed.  Constitutional:      Appearance: She is well-developed.  HENT:     Head: Normocephalic.     Nose: Nose normal.  Eyes:     General: No scleral icterus.    Conjunctiva/sclera: Conjunctivae normal.  Neck:     Musculoskeletal: Neck supple.     Thyroid: No thyromegaly.  Cardiovascular:     Rate and Rhythm: Normal rate and regular rhythm.     Heart sounds: No murmur. No friction rub. No gallop.   Pulmonary:     Breath sounds: No stridor. No wheezing or rales.  Chest:     Chest wall: No tenderness.  Abdominal:     General: There is no distension.     Tenderness: There is no abdominal tenderness. There is no rebound.  Musculoskeletal: Normal range of motion.  Lymphadenopathy:     Cervical: No cervical adenopathy.  Skin:    Findings: No erythema or rash.     Comments: Welts to legs and arms  Neurological:     Mental Status: She is oriented to person, place, and time.     Motor: No abnormal muscle tone.     Coordination: Coordination normal.  Psychiatric:        Behavior: Behavior normal.      ED Treatments / Results  Labs (all labs ordered are listed, but only abnormal results are displayed) Labs Reviewed - No data to display  EKG None  Radiology No results found.  Procedures Procedures (including critical care time)  Medications Ordered in ED Medications  predniSONE (DELTASONE) tablet 40 mg (has no  administration in time range)  famotidine (PEPCID) tablet 20 mg (has no administration in time range)     Initial Impression / Assessment and Plan / ED Course  I have reviewed the triage vital signs and the nursing notes.  Pertinent labs & imaging results that were available  during my care of the patient were reviewed by me and considered in my medical decision making (see chart for details).        Allergic dermatitis.  Patient is placed on Pepcid and prednisone and told to take Benadryl and she will follow-up as needed Final Clinical Impressions(s) / ED Diagnoses   Final diagnoses:  Rash    ED Discharge Orders         Ordered    famotidine (PEPCID) 20 MG tablet  2 times daily     07/18/19 0742    predniSONE (DELTASONE) 20 MG tablet     07/18/19 3329           Milton Ferguson, MD 07/18/19 [phone]

## 2019-07-18 NOTE — ED Triage Notes (Signed)
Rash to left hand, trunk and upper thighs.  C/o itching denies pain.

## 2019-07-28 ENCOUNTER — Telehealth: Payer: Self-pay | Admitting: Gastroenterology

## 2019-07-28 ENCOUNTER — Encounter: Payer: Self-pay | Admitting: Gastroenterology

## 2019-07-28 ENCOUNTER — Ambulatory Visit: Payer: Managed Care, Other (non HMO) | Admitting: Nurse Practitioner

## 2019-07-28 NOTE — Telephone Encounter (Signed)
PATIENT WAS A NO SHOW AND LETTER SENT  °

## 2019-07-28 NOTE — Progress Notes (Deleted)
Primary Care Physician:  Jani Gravel, MD Primary Gastroenterologist:  Dr. Oneida Alar  No chief complaint on file.   HPI:   Vicki [name] is a 53 y.o. female who presents on referral from primary care to schedule colonoscopy.  Nurse/phone triage was deferred office visit due to medications and alcohol.  Reviewed information provided with referral including ***.  No history of colonoscopy found in our system.  Today she states   Past Medical History:  Diagnosis Date  . Abnormal Pap smear   . Anemia   . Anxiety   . BV (bacterial vaginosis) 05/16/2013  . Fibroids    uterine  . Hypertension   . Mental disorder    depression and panic attacks    Past Surgical History:  Procedure Laterality Date  . ABDOMINAL HYSTERECTOMY    . BILATERAL SALPINGECTOMY Bilateral 02/25/2014   Procedure: BILATERAL SALPINGECTOMY;  Surgeon: Florian Buff, MD;  Location: [facility];  Service: Gynecology;  Laterality: Bilateral;  . DILATION AND CURETTAGE OF UTERUS    . SUPRACERVICAL ABDOMINAL HYSTERECTOMY N/A 02/25/2014   Procedure: HYSTERECTOMY SUPRACERVICAL ABDOMINAL;  Surgeon: Florian Buff, MD;  Location: [facility];  Service: Gynecology;  Laterality: N/A;  . TUBAL LIGATION      Current Outpatient Medications  Medication Sig Dispense Refill  . carvedilol (COREG) 3.125 MG tablet Take 3.125 mg by mouth 2 (two) times daily.  6  . citalopram (CELEXA) 20 MG tablet Take 20 mg by mouth every evening.     . famotidine (PEPCID) 20 MG tablet Take 1 tablet (20 mg total) by mouth 2 (two) times daily. 10 tablet 0  . hydrOXYzine (ATARAX/VISTARIL) 25 MG tablet Take 25 mg by mouth 3 (three) times daily as needed. for anxiety  6  . meclizine (ANTIVERT) 25 MG tablet Take 1 tablet (25 mg total) by mouth 3 (three) times daily as needed for dizziness. 15 tablet 0  . predniSONE (DELTASONE) 20 MG tablet 2 tabs po daily x 3 days 6 tablet 0  . Vitamin D, Ergocalciferol, (DRISDOL) 1.25 MG (50000 UT) CAPS capsule TAKE 1 CAPSULE BY MOUTH  ONCE A WEEK FOR LOW VITAMIN D     No current facility-administered medications for this visit.     Allergies as of 07/28/2019  . (No Known Allergies)    Family History  Problem Relation Age of Onset  . Congestive Heart Failure Mother   . Congestive Heart Failure Maternal Grandmother   . Cancer Paternal Grandmother        ovarian    Social History   Socioeconomic History  . Marital status: Legally Separated    Spouse name: Not on file  . Number of children: Not on file  . Years of education: Not on file  . Highest education level: Not on file  Occupational History  . Not on file  Social Needs  . Financial resource strain: Not on file  . Food insecurity    Worry: Not on file    Inability: Not on file  . Transportation needs    Medical: Not on file    Non-medical: Not on file  Tobacco Use  . Smoking status: Never Smoker  . Smokeless tobacco: Never Used  Substance and Sexual Activity  . Alcohol use: Yes    Alcohol/week: 7.0 standard drinks    Types: 7 Cans of beer per week    Comment: 1 beer daily   . Drug use: No  . Sexual activity: Not Currently  Birth control/protection: Surgical  Lifestyle  . Physical activity    Days per week: Not on file    Minutes per session: Not on file  . Stress: Not on file  Relationships  . Social Herbalist on phone: Not on file    Gets together: Not on file    Attends religious service: Not on file    Active member of club or organization: Not on file    Attends meetings of clubs or organizations: Not on file    Relationship status: Not on file  . Intimate partner violence    Fear of current or ex partner: Not on file    Emotionally abused: Not on file    Physically abused: Not on file    Forced sexual activity: Not on file  Other Topics Concern  . Not on file  Social History Narrative  . Not on file    Review of Systems: Complete ROS negative except as per HPI.    Physical Exam: LMP 02/09/2014   General:   Alert and oriented. Pleasant and cooperative. Well-nourished and well-developed.  Head:  Normocephalic and atraumatic. Eyes:  Without icterus, sclera clear and conjunctiva pink.  Ears:  Normal auditory acuity. Mouth:  No deformity or lesions, oral mucosa pink.  Throat/Neck:  Supple, without mass or thyromegaly. Cardiovascular:  S1, S2 present without murmurs appreciated. Normal pulses noted. Extremities without clubbing or edema. Respiratory:  Clear to auscultation bilaterally. No wheezes, rales, or rhonchi. No distress.  Gastrointestinal:  +BS, soft, non-tender and non-distended. No HSM noted. No guarding or rebound. No masses appreciated.  Rectal:  Deferred  Musculoskalatal:  Symmetrical without gross deformities. Normal posture. Skin:  Intact without significant lesions or rashes. Neurologic:  Alert and oriented x4;  grossly normal neurologically. Psych:  Alert and cooperative. Normal mood and affect. Heme/Lymph/Immune: No significant cervical adenopathy. No excessive bruising noted.    07/28/2019 7:51 AM   Disclaimer: This note was dictated with voice recognition software. Similar sounding words can inadvertently be transcribed and may not be corrected upon review.

## 2019-09-25 ENCOUNTER — Other Ambulatory Visit: Payer: Self-pay

## 2019-09-25 DIAGNOSIS — Z20822 Contact with and (suspected) exposure to covid-19: Secondary | ICD-10-CM

## 2019-09-26 LAB — NOVEL CORONAVIRUS, NAA: SARS-CoV-2, NAA: NOT DETECTED

## 2019-10-10 ENCOUNTER — Encounter (HOSPITAL_COMMUNITY): Payer: Self-pay | Admitting: Emergency Medicine

## 2019-10-10 ENCOUNTER — Emergency Department (HOSPITAL_COMMUNITY): Payer: Managed Care, Other (non HMO)

## 2019-10-10 ENCOUNTER — Other Ambulatory Visit: Payer: Self-pay

## 2019-10-10 ENCOUNTER — Emergency Department (HOSPITAL_COMMUNITY)
Admission: EM | Admit: 2019-10-10 | Discharge: 2019-10-10 | Disposition: A | Discharge status: Home / Self Care | Payer: Managed Care, Other (non HMO) | Attending: Emergency Medicine | Admitting: Emergency Medicine

## 2019-10-10 DIAGNOSIS — I10 Essential (primary) hypertension: Secondary | ICD-10-CM | POA: Insufficient documentation

## 2019-10-10 DIAGNOSIS — Z79899 Other long term (current) drug therapy: Secondary | ICD-10-CM | POA: Insufficient documentation

## 2019-10-10 DIAGNOSIS — R42 Dizziness and giddiness: Secondary | ICD-10-CM | POA: Diagnosis present

## 2019-10-10 DIAGNOSIS — R030 Elevated blood-pressure reading, without diagnosis of hypertension: Secondary | ICD-10-CM

## 2019-10-10 NOTE — ED Triage Notes (Signed)
Pt states she took her BP this morning at work. First reading was 185/100 and second reading was 190/102. Pt has hx of hypertension and take medication daily. Denies missing dose. Endorses lightheadedness.

## 2019-10-10 NOTE — Discharge Instructions (Signed)
Please follow-up with Dr. Davis Gourd on Monday to have blood pressure rechecked and up after today's visit.

## 2019-10-10 NOTE — ED Provider Notes (Signed)
[facility] EMERGENCY DEPARTMENT Provider Note   CSN: [id number] Arrival date & time: 10/10/19  [id number]     History   Chief Complaint Chief Complaint  Patient presents with   Hypertension    HPI Vicki [name] is a 53 y.o. female history of hypertension on Coreg and olmesartan presenting to ED with elevated BP at work today with dizziness, facial numbness.   Patient states she noticed some mild blurry vision and numbness in her lower face this morning and felt slightly dizzy at 7:30 AM while at work.  Patient states she checked her blood pressure and was 185/100 which is elevated from her normal of 150/90.  Patient states that she took her morning Coreg and olmesartan.   Patient states she has had elevated blood pressures in the past and been seen in the ED.  Upon review of EMR patient was last seen for elevated BP 06/27/2019 with negative head CT, EKG and evaluation.  At that time patient had a headache but no neurological deficits or significant vision changes.  Denies any chest pain, shortness of breath, nausea, vomiting, headache.  Patient denies any visual field deficits, dysarthria, confusion, ataxia difficulty walking, focal weakness.     HPI  Past Medical History:  Diagnosis Date   Abnormal Pap smear    Anemia    Anxiety    BV (bacterial vaginosis) 05/16/2013   Fibroids    uterine   Hypertension    Mental disorder    depression and panic attacks    Patient Active Problem List   Diagnosis Date Noted   S/P abdominal hysterectomy 02/25/2014   Depression 07/25/2013   Panic attacks 07/25/2013   Fibroids 05/16/2013   BV (bacterial vaginosis) 05/16/2013   Anemia 04/03/2012   Menorrhagia 04/03/2012   Hypokalemia 04/03/2012   UTI (lower urinary tract infection) 04/03/2012   Emesis 04/03/2012    Past Surgical History:  Procedure Laterality Date   ABDOMINAL HYSTERECTOMY     BILATERAL SALPINGECTOMY Bilateral 02/25/2014   Procedure: BILATERAL  SALPINGECTOMY;  Surgeon: Florian Buff, MD;  Location: [facility];  Service: Gynecology;  Laterality: Bilateral;   DILATION AND CURETTAGE OF UTERUS     SUPRACERVICAL ABDOMINAL HYSTERECTOMY N/A 02/25/2014   Procedure: HYSTERECTOMY SUPRACERVICAL ABDOMINAL;  Surgeon: Florian Buff, MD;  Location: [facility];  Service: Gynecology;  Laterality: N/A;   TUBAL LIGATION       OB History    Gravida  7   Para  5   Term      Preterm      AB  2   Living  5     SAB  2   TAB      Ectopic      Multiple      Live Births               Home Medications    Prior to Admission medications   Medication Sig Start Date End Date Taking? Authorizing Provider  carvedilol (COREG) 3.125 MG tablet Take 3.125 mg by mouth 2 (two) times daily. 08/20/18   [provider]  citalopram (CELEXA) 20 MG tablet Take 20 mg by mouth every evening.     [provider]  famotidine (PEPCID) 20 MG tablet Take 1 tablet (20 mg total) by mouth 2 (two) times daily. 07/18/19   Milton Ferguson, MD  hydrOXYzine (ATARAX/VISTARIL) 25 MG tablet Take 25 mg by mouth 3 (three) times daily as needed. for anxiety 08/20/18   [provider]  meclizine (ANTIVERT) 25 MG tablet Take 1 tablet (25 mg total) by mouth 3 (three) times daily as needed for dizziness. 11/09/18   Nat Christen, MD  predniSONE (DELTASONE) 20 MG tablet 2 tabs po daily x 3 days 07/18/19   Milton Ferguson, MD  Vitamin D, Ergocalciferol, (DRISDOL) 1.25 MG (50000 UT) CAPS capsule TAKE 1 CAPSULE BY MOUTH ONCE A WEEK FOR LOW VITAMIN D 04/22/19   [provider]    Family History Family History  Problem Relation Age of Onset   Congestive Heart Failure Mother    Congestive Heart Failure Maternal Grandmother    Cancer Paternal Grandmother        ovarian    Social History Social History   Tobacco Use   Smoking status: Never Smoker   Smokeless tobacco: Never Used  Substance Use Topics   Alcohol use: Yes    Alcohol/week: 7.0  standard drinks    Types: 7 Cans of beer per week    Comment: 1 beer daily    Drug use: No     Allergies   Patient has no known allergies.   Review of Systems Review of Systems  Constitutional: Negative for chills and fever.  HENT: Negative for congestion.   Respiratory: Negative for chest tightness and shortness of breath.   Cardiovascular: Negative for chest pain.  Gastrointestinal: Negative for abdominal pain.  Genitourinary: Negative for dysuria.  Musculoskeletal: Negative for back pain and neck pain.  Neurological: Positive for dizziness. Negative for headaches.  Psychiatric/Behavioral: Negative for confusion.     Physical Exam Updated Vital Signs BP (!) 153/83    Pulse 73    Temp 99.1 F (37.3 C) (Oral)    Resp 19    Ht 5\' 7"  (1.702 m)    Wt 68.5 kg    LMP 02/09/2014    SpO2 98%    BMI 23.65 kg/m   Physical Exam Vitals signs and nursing note reviewed.  Constitutional:      General: She is not in acute distress. HENT:     Head: Normocephalic and atraumatic.     Nose: Nose normal.  Eyes:     General: No scleral icterus. Neck:     Musculoskeletal: Normal range of motion.  Cardiovascular:     Rate and Rhythm: Normal rate and regular rhythm.     Pulses: Normal pulses.     Heart sounds: Normal heart sounds.  Pulmonary:     Effort: Pulmonary effort is normal. No respiratory distress.     Breath sounds: No wheezing.  Abdominal:     Palpations: Abdomen is soft.     Tenderness: There is no abdominal tenderness.  Musculoskeletal:     Right lower leg: No edema.     Left lower leg: No edema.  Skin:    General: Skin is warm and dry.     Capillary Refill: Capillary refill takes less than 2 seconds.  Neurological:     Mental Status: She is alert. Mental status is at baseline.     Comments: Alert and oriented to self, place, time and event.  Speech is fluent, clear without dysarthria or dysphasia.  Strength 5/5 in upper/lower extremities  Light sensation intact  all 4 extremities.  Unable to differentiate sharp and dull sensation in right fingertips.  Unable to differentiate sharp and dull sensation in lower right face.  Intact sensation to sharp and dull bilateral forehead. Normal gait. Negative Romberg. No pronator drift.  Normal finger-to-nose and feet tapping.  CN I  not tested  CN II grossly intact visual fields bilaterally. Did not visualize posterior eye.   CN III, IV, VI PERRLA and EOMs intact bilaterally  CN V Intact sensation to sharp and light touch to the face  CN VII facial movements symmetric  CN VIII not tested  CN IX, X no uvula deviation, symmetric rise of soft palate  CN XI 5/5 SCM and trapezius strength bilaterally  CN XII Midline tongue protrusion, symmetric L/R movements   Psychiatric:        Mood and Affect: Mood normal.        Behavior: Behavior normal.      ED Treatments / Results  Labs (all labs ordered are listed, but only abnormal results are displayed) Labs Reviewed - No data to display  EKG EKG Interpretation  Date/Time:  Friday October 10 2019 10:02:41 EDT Ventricular Rate:  69 PR Interval:    QRS Duration: 81 QT Interval:  431 QTC Calculation: 462 R Axis:   50 Text Interpretation:  Sinus rhythm No significant change since last tracing Confirmed by Lajean Saver [phone]) on 10/10/2019 10:14:06 AM   Radiology Ct Head Wo Contrast  Result Date: 10/10/2019 CLINICAL DATA:  53 year old female with history of focal neurologic deficit for the past 6 hours. Suspected stroke. EXAM: CT HEAD WITHOUT CONTRAST TECHNIQUE: Contiguous axial images were obtained from the base of the skull through the vertex without intravenous contrast. COMPARISON:  Head CT 06/27/2019. FINDINGS: Brain: No evidence of acute infarction, hemorrhage, hydrocephalus, extra-axial collection or mass lesion/mass effect. Vascular: No hyperdense vessel or unexpected calcification. Skull: Normal. Negative for fracture or focal lesion. Sinuses/Orbits:  No acute finding. Other: None. IMPRESSION: 1. No acute intracranial abnormalities. The appearance of the brain is normal. Electronically Signed   By: Vinnie Langton M.D.   On: 10/10/2019 10:31    Procedures Procedures (including critical care time)  Medications Ordered in ED Medications - No data to display   Initial Impression / Assessment and Plan / ED Course  I have reviewed the triage vital signs and the nursing notes.  Pertinent labs & imaging results that were available during my care of the patient were reviewed by me and considered in my medical decision making (see chart for details).        Patient presents to ED with partial facial numbness and dizziness after elevated blood pressure reading at work this morning.  Patient has reassuring CT head and EKG with no acute finding.  Blood pressure initially elevated at 182/86 improved to 153/83 during ED stay.  Patient is well-appearing has no other neurological deficits other than sharp dull differentiation difficulty on face and right hand.  Still has sensation in these areas and elsewhere.  On reevaluation patient feels well and is not dizzy.  Will follow up with primary care doctor to recheck blood pressure and evaluate antihypertensive regimen.  Patient appears well and is ambulating, tolerating p.o., able to explain plan back and agreeable to plan.  Doubt need for further work-up at this time as patient has reassuring presentation and had normal BMP and CBC at last visit 3 months ago. Will defer further workup to primary care.     Final Clinical Impressions(s) / ED Diagnoses   Final diagnoses:  Elevated blood pressure reading    ED Discharge Orders    None       Tedd Sias, [place] 10/10/19 1150    Lajean Saver, MD 10/10/19 1541

## 2019-11-07 ENCOUNTER — Other Ambulatory Visit: Payer: Self-pay

## 2019-11-07 ENCOUNTER — Emergency Department (HOSPITAL_COMMUNITY): Payer: Managed Care, Other (non HMO)

## 2019-11-07 ENCOUNTER — Emergency Department (HOSPITAL_COMMUNITY)
Admission: EM | Admit: 2019-11-07 | Discharge: 2019-11-08 | Disposition: A | Discharge status: Home / Self Care | Payer: Managed Care, Other (non HMO) | Attending: Emergency Medicine | Admitting: Emergency Medicine

## 2019-11-07 ENCOUNTER — Encounter (HOSPITAL_COMMUNITY): Payer: Self-pay | Admitting: Emergency Medicine

## 2019-11-07 DIAGNOSIS — R079 Chest pain, unspecified: Secondary | ICD-10-CM

## 2019-11-07 DIAGNOSIS — I10 Essential (primary) hypertension: Secondary | ICD-10-CM | POA: Diagnosis not present

## 2019-11-07 LAB — BASIC METABOLIC PANEL
Anion gap: 9 (ref 5–15)
BUN: 14 mg/dL (ref 6–20)
CO2: 25 mmol/L (ref 22–32)
Calcium: 9.7 mg/dL (ref 8.9–10.3)
Chloride: 106 mmol/L (ref 98–111)
Creatinine, Ser: 1.1 mg/dL — ABNORMAL HIGH (ref 0.44–1.00)
GFR calc Af Amer: >60 mL/min (ref >60)
GFR calc non Af Amer: 57 mL/min — ABNORMAL LOW (ref >60)
Glucose, Bld: 92 mg/dL (ref 70–99)
Potassium: 3.5 mmol/L (ref 3.5–5.1)
Sodium: 140 mmol/L (ref 135–145)

## 2019-11-07 LAB — CBC
HCT: 41.5 % (ref 36.0–46.0)
Hemoglobin: 13.4 g/dL (ref 12.0–15.0)
MCH: 27.9 pg (ref 26.0–34.0)
MCHC: 32.3 g/dL (ref 30.0–36.0)
MCV: 86.3 fL (ref 80.0–100.0)
Platelets: 273 10*3/uL (ref 150–400)
RBC: 4.81 MIL/uL (ref 3.87–5.11)
RDW: 13.8 % (ref 11.5–15.5)
WBC: 7.6 10*3/uL (ref 4.0–10.5)
nRBC: 0 % (ref 0.0–0.2)

## 2019-11-07 LAB — TROPONIN I (HIGH SENSITIVITY): Troponin I (High Sensitivity): <2 ng/L (ref <18)

## 2019-11-07 MED ORDER — AMLODIPINE BESYLATE 5 MG PO TABS
5.0000 mg | ORAL_TABLET | Freq: Once | ORAL | Status: AC
  Administered 2019-11-07: 5 mg via ORAL
  Filled 2019-11-07: qty 1

## 2019-11-07 NOTE — ED Triage Notes (Signed)
Pt c/o upper chest pain and pain between her shoulder blades that started x 2 hours ago.

## 2019-11-08 LAB — TROPONIN I (HIGH SENSITIVITY): Troponin I (High Sensitivity): <2 ng/L (ref <18)

## 2019-11-08 MED ORDER — AMLODIPINE BESYLATE 5 MG PO TABS: 5.0000 mg | ORAL_TABLET | Freq: Every day | ORAL | 1 refills | Status: DC

## 2019-11-08 NOTE — ED Provider Notes (Signed)
Emergency Department Provider Note   I have reviewed the triage vital signs and the nursing notes.   HISTORY  Chief Complaint Chest Pain   HPI Vicki [name] is a 53 y.o. female who presents the emergency department today with nonspecific chest pain.  Patient states that the epigastric symptoms in the left.  Does not really radiate.  She has some shortness of breath once in a while but no nausea, vomiting, diaphoresis or other associated symptoms.  She states she is under lots was at work and has also noticed that her blood pressures have been low but higher.  She is on medications and has not missed any doses that she knows of.  No headache, urinary changes, extremity swelling or other associated symptoms.   No other associated or modifying symptoms.    Past Medical History:  Diagnosis Date   Abnormal Pap smear    Anemia    Anxiety    BV (bacterial vaginosis) 05/16/2013   Fibroids    uterine   Hypertension    Mental disorder    depression and panic attacks    Patient Active Problem List   Diagnosis Date Noted   S/P abdominal hysterectomy 02/25/2014   Depression 07/25/2013   Panic attacks 07/25/2013   Fibroids 05/16/2013   BV (bacterial vaginosis) 05/16/2013   Anemia 04/03/2012   Menorrhagia 04/03/2012   Hypokalemia 04/03/2012   UTI (lower urinary tract infection) 04/03/2012   Emesis 04/03/2012    Past Surgical History:  Procedure Laterality Date   ABDOMINAL HYSTERECTOMY     BILATERAL SALPINGECTOMY Bilateral 02/25/2014   Procedure: BILATERAL SALPINGECTOMY;  Surgeon: Florian Buff, MD;  Location: [facility];  Service: Gynecology;  Laterality: Bilateral;   DILATION AND CURETTAGE OF UTERUS     SUPRACERVICAL ABDOMINAL HYSTERECTOMY N/A 02/25/2014   Procedure: HYSTERECTOMY SUPRACERVICAL ABDOMINAL;  Surgeon: Florian Buff, MD;  Location: [facility];  Service: Gynecology;  Laterality: N/A;   TUBAL LIGATION      Current Outpatient Rx   Order #:  [id number] Class: Print   Order #: [id number] Class: Historical Med   Order #: [id number] Class: Historical Med   Order #: [id number] Class: Print   Order #: [id number] Class: Historical Med   Order #: [id number] Class: Print   Order #: [id number] Class: Print   Order #: [id number] Class: Historical Med    Allergies Patient has no known allergies.  Family History  Problem Relation Age of Onset   Congestive Heart Failure Mother    Congestive Heart Failure Maternal Grandmother    Cancer Paternal Grandmother        ovarian    Social History Social History   Tobacco Use   Smoking status: Never Smoker   Smokeless tobacco: Never Used  Substance Use Topics   Alcohol use: Yes    Alcohol/week: 7.0 standard drinks    Types: 7 Cans of beer per week    Comment: 1 beer daily    Drug use: No    Review of Systems  All other systems negative except as documented in the HPI. All pertinent positives and negatives as reviewed in the HPI. ____________________________________________   PHYSICAL EXAM:  VITAL SIGNS: ED Triage Vitals  Enc Vitals Group     BP 11/07/19 2120 (!) 174/84     Pulse Rate 11/07/19 2120 72     Resp 11/07/19 2120 18     Temp 11/07/19 2120 97.8 F (36.6 C)     Temp src --      SpO2 11/07/19 2120 98 %  Weight 11/07/19 2121 160 lb (72.6 kg)     Height 11/07/19 2121 5\' 7"  (1.702 m)    Constitutional: Alert and oriented. Well appearing and in no acute distress. Eyes: Conjunctivae are normal. PERRL. EOMI. Head: Atraumatic. Nose: No congestion/rhinnorhea. Mouth/Throat: Mucous membranes are moist.  Oropharynx non-erythematous. Neck: No stridor.  No meningeal signs.   Cardiovascular: Normal rate, regular rhythm. Good peripheral circulation. Grossly normal heart sounds.   Respiratory: Normal respiratory effort.  No retractions. Lungs CTAB. Gastrointestinal: Soft and nontender. No distention.  Musculoskeletal: No lower extremity tenderness nor edema. No gross  deformities of extremities. Neurologic: No altered mental status, able to give full seemingly accurate history.  Face is symmetric, EOM's intact, pupils equal and reactive, vision intact, tongue and uvula midline without deviation. Upper and Lower extremity motor 5/5, intact pain perception in distal extremities, 2+ reflexes in biceps, patella and achilles tendons. Able to perform finger to nose normal with both hands. Walks without assistance or evident ataxia.   Skin:  Skin is warm, dry and intact. No rash noted.   ____________________________________________   LABS (all labs ordered are listed, but only abnormal results are displayed)  Labs Reviewed  BASIC METABOLIC PANEL - Abnormal; Notable for the following components:      Result Value   Creatinine, Ser 1.10 (*)    GFR calc non Af Amer 57 (*)    All other components within normal limits  CBC  POC URINE PREG, ED  TROPONIN I (HIGH SENSITIVITY)  TROPONIN I (HIGH SENSITIVITY)   ____________________________________________  EKG   EKG Interpretation  Date/Time:  Friday November 07 2019 21:27:02 EST Ventricular Rate:  67 PR Interval:  128 QRS Duration: 72 QT Interval:  404 QTC Calculation: 426 R Axis:   71 Text Interpretation: Normal sinus rhythm Normal ECG No significant change since last tracing Confirmed by Merrily Pew [phone]) on 11/07/2019 11:36:07 PM       ____________________________________________  RADIOLOGY  Dg Chest 2 View  Result Date: 11/07/2019 CLINICAL DATA:  Chest pain. Pain between shoulder blades. Symptoms for 2 hours. EXAM: CHEST - 2 VIEW COMPARISON:  05/25/2019 FINDINGS: The cardiomediastinal contours are normal. Mild biapical pleuroparenchymal scarring, stable. Pulmonary vasculature is normal. No consolidation, pleural effusion, or pneumothorax. No acute osseous abnormalities are seen. IMPRESSION: No acute abnormality. Electronically Signed   By: Keith Rake M.D.   On: 11/07/2019 21:49     ____________________________________________   PROCEDURES  Procedure(s) performed:   Procedures   ____________________________________________   INITIAL IMPRESSION / ASSESSMENT AND PLAN / ED COURSE  Normal EKG, nonspecific pain, atypical story.  Suspect this is probably all related to stress.  Her blood pressure is pretty consistently high here so we will start Norvasc.  Low suspicion for ACS, hypertensive emergency or other indication for acute rapid blood pressure lowering.  Can follow-up with PCP for same.     Pertinent labs & imaging results that were available during my care of the patient were reviewed by me and considered in my medical decision making (see chart for details).  A medical screening exam was performed and I feel the patient has had an appropriate workup for their chief complaint at this time and likelihood of emergent condition existing is low. They have been counseled on decision, discharge, follow up and which symptoms necessitate immediate return to the emergency department. They or their family verbally stated understanding and agreement with plan and discharged in stable condition.   ____________________________________________  FINAL CLINICAL IMPRESSION(S) / ED DIAGNOSES  Final diagnoses:  Nonspecific chest pain  Hypertension, unspecified type     MEDICATIONS GIVEN DURING THIS VISIT:  Medications  amLODipine (NORVASC) tablet 5 mg (5 mg Oral Given 11/07/19 2342)     NEW OUTPATIENT MEDICATIONS STARTED DURING THIS VISIT:  New Prescriptions   AMLODIPINE (NORVASC) 5 MG TABLET    Take 1 tablet (5 mg total) by mouth daily.    Note:  This note was prepared with assistance of Dragon voice recognition software. Occasional wrong-word or sound-a-like substitutions may have occurred due to the inherent limitations of voice recognition software.   Mistie Adney, Corene Cornea, MD 11/08/19 [phone]

## 2019-12-27 ENCOUNTER — Other Ambulatory Visit: Payer: Self-pay

## 2019-12-27 ENCOUNTER — Emergency Department (HOSPITAL_COMMUNITY)
Admission: EM | Admit: 2019-12-27 | Discharge: 2019-12-27 | Disposition: A | Discharge status: Home / Self Care | Payer: Managed Care, Other (non HMO) | Attending: Emergency Medicine | Admitting: Emergency Medicine

## 2019-12-27 ENCOUNTER — Encounter (HOSPITAL_COMMUNITY): Payer: Self-pay

## 2019-12-27 DIAGNOSIS — M545 Low back pain: Secondary | ICD-10-CM | POA: Diagnosis present

## 2019-12-27 DIAGNOSIS — I10 Essential (primary) hypertension: Secondary | ICD-10-CM | POA: Insufficient documentation

## 2019-12-27 DIAGNOSIS — M5441 Lumbago with sciatica, right side: Secondary | ICD-10-CM | POA: Insufficient documentation

## 2019-12-27 DIAGNOSIS — Z79899 Other long term (current) drug therapy: Secondary | ICD-10-CM | POA: Insufficient documentation

## 2019-12-27 MED ORDER — PREDNISONE 20 MG PO TABS: 40.0000 mg | ORAL_TABLET | Freq: Every day | ORAL | 0 refills | Status: DC

## 2019-12-27 MED ORDER — METHOCARBAMOL 500 MG PO TABS: 500.0000 mg | ORAL_TABLET | Freq: Three times a day (TID) | ORAL | 0 refills | Status: DC | PRN

## 2019-12-27 NOTE — ED Provider Notes (Signed)
[facility] EMERGENCY DEPARTMENT Provider Note   CSN: [id number] Arrival date & time: 12/27/19  [id number]     History Chief Complaint  Patient presents with  . Back Pain    Vicki [name] is a 54 y.o. female.  HPI Patient presents with back pain.  Low back pain that radiates down the right leg.  Began worse after lifting heavy patient 2 days ago.  Usually does not have the radiation.  No loss of bladder or bowel control.  No weakness.  States worse with movements.  No fevers or chills.  No IV drug use.  No malignancy history.  No dysuria.  States began after moving a heavy patient by herself.    Past Medical History:  Diagnosis Date  . Abnormal Pap smear   . Anemia   . Anxiety   . BV (bacterial vaginosis) 05/16/2013  . Fibroids    uterine  . Hypertension   . Mental disorder    depression and panic attacks    Patient Active Problem List   Diagnosis Date Noted  . S/P abdominal hysterectomy 02/25/2014  . Depression 07/25/2013  . Panic attacks 07/25/2013  . Fibroids 05/16/2013  . BV (bacterial vaginosis) 05/16/2013  . Anemia 04/03/2012  . Menorrhagia 04/03/2012  . Hypokalemia 04/03/2012  . UTI (lower urinary tract infection) 04/03/2012  . Emesis 04/03/2012    Past Surgical History:  Procedure Laterality Date  . ABDOMINAL HYSTERECTOMY    . BILATERAL SALPINGECTOMY Bilateral 02/25/2014   Procedure: BILATERAL SALPINGECTOMY;  Surgeon: Florian Buff, MD;  Location: [facility];  Service: Gynecology;  Laterality: Bilateral;  . DILATION AND CURETTAGE OF UTERUS    . SUPRACERVICAL ABDOMINAL HYSTERECTOMY N/A 02/25/2014   Procedure: HYSTERECTOMY SUPRACERVICAL ABDOMINAL;  Surgeon: Florian Buff, MD;  Location: [facility];  Service: Gynecology;  Laterality: N/A;  . TUBAL LIGATION       OB History    Gravida  7   Para  5   Term      Preterm      AB  2   Living  5     SAB  2   TAB      Ectopic      Multiple      Live Births              Family History  Problem  Relation Age of Onset  . Congestive Heart Failure Mother   . Congestive Heart Failure Maternal Grandmother   . Cancer Paternal Grandmother        ovarian    Social History   Tobacco Use  . Smoking status: Never Smoker  . Smokeless tobacco: Never Used  Substance Use Topics  . Alcohol use: Yes    Comment: 1 beer daily   . Drug use: No    Home Medications Prior to Admission medications   Medication Sig Start Date End Date Taking? Authorizing Provider  amLODipine (NORVASC) 5 MG tablet Take 1 tablet (5 mg total) by mouth daily. 11/08/19   Mesner, Corene Cornea, MD  carvedilol (COREG) 3.125 MG tablet Take 3.125 mg by mouth 2 (two) times daily. 08/20/18   [provider]  citalopram (CELEXA) 20 MG tablet Take 20 mg by mouth every evening.     [provider]  famotidine (PEPCID) 20 MG tablet Take 1 tablet (20 mg total) by mouth 2 (two) times daily. 07/18/19   Milton Ferguson, MD  hydrOXYzine (ATARAX/VISTARIL) 25 MG tablet Take 25 mg by mouth 3 (three) times  daily as needed. for anxiety 08/20/18   [provider]  meclizine (ANTIVERT) 25 MG tablet Take 1 tablet (25 mg total) by mouth 3 (three) times daily as needed for dizziness. 11/09/18   Nat Christen, MD  methocarbamol (ROBAXIN) 500 MG tablet Take 1 tablet (500 mg total) by mouth every 8 (eight) hours as needed for muscle spasms. 12/27/19   Davonna Belling, MD  predniSONE (DELTASONE) 20 MG tablet Take 2 tablets (40 mg total) by mouth daily. 12/27/19   Davonna Belling, MD  Vitamin D, Ergocalciferol, (DRISDOL) 1.25 MG (50000 UT) CAPS capsule TAKE 1 CAPSULE BY MOUTH ONCE A WEEK FOR LOW VITAMIN D 04/22/19   [provider]    Allergies    Patient has no known allergies.  Review of Systems   Review of Systems  Constitutional: Negative for appetite change, fatigue and fever.  Respiratory: Negative for shortness of breath.   Cardiovascular: Negative for chest pain.  Gastrointestinal: Negative for abdominal pain.    Genitourinary: Negative for vaginal pain.  Musculoskeletal: Positive for back pain.  Skin: Negative for rash.  Neurological: Negative for weakness and numbness.    Physical Exam Updated Vital Signs BP (!) 158/89 (BP Location: Left Arm)   Pulse 65   Temp 98.2 F (36.8 C) (Oral)   Resp 18   Ht 5\' 7"  (1.702 m)   Wt 73 kg   LMP 02/09/2014   SpO2 100%   BMI 25.22 kg/m   Physical Exam Vitals and nursing note reviewed.  HENT:     Head: Normocephalic.  Eyes:     Pupils: Pupils are equal, round, and reactive to light.  Cardiovascular:     Rate and Rhythm: Normal rate and regular rhythm.  Pulmonary:     Breath sounds: No wheezing or rhonchi.  Abdominal:     Tenderness: There is no abdominal tenderness.  Musculoskeletal:     Right lower leg: No edema.     Left lower leg: No edema.     Comments: Pain with straight leg raise on right.  Some pain on right lower lumbar spine and SI area.  Skin:    General: Skin is warm.     Capillary Refill: Capillary refill takes less than 2 seconds.  Neurological:     Mental Status: She is alert.     Comments: Strength and sensation intact in right lower extremity, however does have pain.     ED Results / Procedures / Treatments   Labs (all labs ordered are listed, but only abnormal results are displayed) Labs Reviewed - No data to display  EKG None  Radiology No results found.  Procedures Procedures (including critical care time)  Medications Ordered in ED Medications - No data to display  ED Course  I have reviewed the triage vital signs and the nursing notes.  Pertinent labs & imaging results that were available during my care of the patient were reviewed by me and considered in my medical decision making (see chart for details).    MDM Rules/Calculators/A&P                     Patient with low back pain radiating down the leg.  Radiculopathy.  Will treat with steroids and muscle relaxers.  Outpatient follow-up does not  have red flags. Final Clinical Impression(s) / ED Diagnoses Final diagnoses:  Acute right-sided low back pain with right-sided sciatica    Rx / DC Orders ED Discharge Orders  Ordered    methocarbamol (ROBAXIN) 500 MG tablet  Every 8 hours PRN     12/27/19 0917    predniSONE (DELTASONE) 20 MG tablet  Daily     12/27/19 0917           Davonna Belling, MD 12/27/19 [phone]

## 2019-12-27 NOTE — ED Triage Notes (Signed)
Pt reports pain in lower back radiating down r leg.  Reports history of back pain but says its never radiated down her leg.  Denies injury.

## 2020-02-10 ENCOUNTER — Other Ambulatory Visit: Payer: Self-pay

## 2020-02-10 ENCOUNTER — Encounter (HOSPITAL_COMMUNITY): Payer: Self-pay

## 2020-02-10 ENCOUNTER — Emergency Department (HOSPITAL_COMMUNITY)
Admission: EM | Admit: 2020-02-10 | Discharge: 2020-02-10 | Disposition: A | Discharge status: Home / Self Care | Payer: Managed Care, Other (non HMO) | Attending: Emergency Medicine | Admitting: Emergency Medicine

## 2020-02-10 ENCOUNTER — Emergency Department (HOSPITAL_COMMUNITY): Payer: Managed Care, Other (non HMO)

## 2020-02-10 DIAGNOSIS — I10 Essential (primary) hypertension: Secondary | ICD-10-CM | POA: Insufficient documentation

## 2020-02-10 DIAGNOSIS — R079 Chest pain, unspecified: Secondary | ICD-10-CM

## 2020-02-10 HISTORY — DX: Tachycardia, unspecified: R00.0

## 2020-02-10 LAB — BASIC METABOLIC PANEL
Anion gap: 12 (ref 5–15)
BUN: 9 mg/dL (ref 6–20)
CO2: 24 mmol/L (ref 22–32)
Calcium: 9.2 mg/dL (ref 8.9–10.3)
Chloride: 104 mmol/L (ref 98–111)
Creatinine, Ser: 1.05 mg/dL — ABNORMAL HIGH (ref 0.44–1.00)
GFR calc Af Amer: >60 mL/min (ref >60)
GFR calc non Af Amer: >60 mL/min (ref >60)
Glucose, Bld: 114 mg/dL — ABNORMAL HIGH (ref 70–99)
Potassium: 3.8 mmol/L (ref 3.5–5.1)
Sodium: 140 mmol/L (ref 135–145)

## 2020-02-10 LAB — CBC
HCT: 41.9 % (ref 36.0–46.0)
Hemoglobin: 14 g/dL (ref 12.0–15.0)
MCH: 28.3 pg (ref 26.0–34.0)
MCHC: 33.4 g/dL (ref 30.0–36.0)
MCV: 84.6 fL (ref 80.0–100.0)
Platelets: 301 10*3/uL (ref 150–400)
RBC: 4.95 MIL/uL (ref 3.87–5.11)
RDW: 13.8 % (ref 11.5–15.5)
WBC: 7 10*3/uL (ref 4.0–10.5)
nRBC: 0 % (ref 0.0–0.2)

## 2020-02-10 LAB — TROPONIN I (HIGH SENSITIVITY)
Troponin I (High Sensitivity): <2 ng/L (ref <18)
Troponin I (High Sensitivity): <2 ng/L (ref <18)

## 2020-02-10 MED ORDER — ALUM & MAG HYDROXIDE-SIMETH 200-200-20 MG/5ML PO SUSP
30.0000 mL | Freq: Once | ORAL | Status: AC
  Administered 2020-02-10: 30 mL via ORAL
  Filled 2020-02-10: qty 30

## 2020-02-10 MED ORDER — SUCRALFATE 1 G PO TABS: 1.0000 g | ORAL_TABLET | Freq: Four times a day (QID) | ORAL | 0 refills | Status: DC | PRN

## 2020-02-10 MED ORDER — ASPIRIN 81 MG PO CHEW
324.0000 mg | CHEWABLE_TABLET | Freq: Once | ORAL | Status: AC
  Administered 2020-02-10: 324 mg via ORAL
  Filled 2020-02-10: qty 4

## 2020-02-10 MED ORDER — LIDOCAINE VISCOUS HCL 2 % MT SOLN
15.0000 mL | Freq: Once | OROMUCOSAL | Status: AC
  Administered 2020-02-10: 08:00:00 15 mL via ORAL
  Filled 2020-02-10: qty 15

## 2020-02-10 NOTE — ED Triage Notes (Signed)
Pt reports mid chest pain that woke her up at 1 am. Feels like something sitting on her chest

## 2020-02-10 NOTE — Discharge Instructions (Addendum)
You were evaluated in the Emergency Department and after careful evaluation, we did not find any emergent condition requiring admission or further testing in the hospital.  Your exam/testing today was overall reassuring.  You can use the Carafate medication as needed for pain.  We recommend follow-up with your primary care doctor to discuss need for outpatient stress testing of the heart.  Please return to the Emergency Department if you experience any worsening of your condition.  We encourage you to follow up with a primary care provider.  Thank you for allowing [place] to be a part of your care.

## 2020-02-10 NOTE — ED Provider Notes (Signed)
[facility] Emergency Department Provider Note MRN:  [id number]  Arrival date & time: 02/10/20     Chief Complaint   Chest Pain   History of Present Illness   Vicki [name] is a 54 y.o. year-old female with a history of hypertension presenting to the ED with chief complaint of chest pain.  Location: Central chest and left-sided chest Duration: 7 hours Onset: Sudden Timing: Constant Description: Pressure, "someone sitting on my chest" Severity: Moderate Exacerbating/Alleviating Factors: Occurred when lying flat try to sleep Associated Symptoms: Dizziness, nausea Pertinent Negatives: Denies shortness of breath, no recent fever or cough, no diaphoresis, no abdominal pain, no vomiting, no diarrhea.   Review of Systems  A complete 10 system review of systems was obtained and all systems are negative except as noted in the HPI and PMH.   Patient's Health History    Past Medical History:  Diagnosis Date  . Abnormal Pap smear   . Anemia   . Anxiety   . BV (bacterial vaginosis) 05/16/2013  . Fibroids    uterine  . Hypertension   . Mental disorder    depression and panic attacks  . Tachycardia     Past Surgical History:  Procedure Laterality Date  . ABDOMINAL HYSTERECTOMY    . BILATERAL SALPINGECTOMY Bilateral 02/25/2014   Procedure: BILATERAL SALPINGECTOMY;  Surgeon: Florian Buff, MD;  Location: [facility];  Service: Gynecology;  Laterality: Bilateral;  . DILATION AND CURETTAGE OF UTERUS    . SUPRACERVICAL ABDOMINAL HYSTERECTOMY N/A 02/25/2014   Procedure: HYSTERECTOMY SUPRACERVICAL ABDOMINAL;  Surgeon: Florian Buff, MD;  Location: [facility];  Service: Gynecology;  Laterality: N/A;  . TUBAL LIGATION      Family History  Problem Relation Age of Onset  . Congestive Heart Failure Mother   . Congestive Heart Failure Maternal Grandmother   . Cancer Paternal Grandmother        ovarian    Social History   Socioeconomic History  . Marital status: Legally  Separated    Spouse name: Not on file  . Number of children: Not on file  . Years of education: Not on file  . Highest education level: Not on file  Occupational History  . Not on file  Tobacco Use  . Smoking status: Never Smoker  . Smokeless tobacco: Never Used  Substance and Sexual Activity  . Alcohol use: Yes    Comment: 1 beer daily   . Drug use: No  . Sexual activity: Not Currently    Birth control/protection: Surgical  Other Topics Concern  . Not on file  Social History Narrative  . Not on file   Social Determinants of Health   Financial Resource Strain:   . Difficulty of Paying Living Expenses: Not on file  Food Insecurity:   . Worried About Charity fundraiser in the Last Year: Not on file  . Ran Out of Food in the Last Year: Not on file  Transportation Needs:   . Lack of Transportation (Medical): Not on file  . Lack of Transportation (Non-Medical): Not on file  Physical Activity:   . Days of Exercise per Week: Not on file  . Minutes of Exercise per Session: Not on file  Stress:   . Feeling of Stress : Not on file  Social Connections:   . Frequency of Communication with Friends and Family: Not on file  . Frequency of Social Gatherings with Friends and Family: Not on file  . Attends Religious Services: Not  on file  . Active Member of Clubs or Organizations: Not on file  . Attends Archivist Meetings: Not on file  . Marital Status: Not on file  Intimate Partner Violence:   . Fear of Current or Ex-Partner: Not on file  . Emotionally Abused: Not on file  . Physically Abused: Not on file  . Sexually Abused: Not on file     Physical Exam   Vitals:   02/10/20 0845 02/10/20 0900  BP:  136/82  Pulse: 73 74  Resp: 16 17  Temp:    SpO2: 99% 98%    CONSTITUTIONAL: Well-appearing, NAD NEURO:  Alert and oriented x 3, no focal deficits EYES:  eyes equal and reactive ENT/NECK:  no LAD, no JVD CARDIO: Regular rate, well-perfused, normal S1 and  S2 PULM:  CTAB no wheezing or rhonchi GI/GU:  normal bowel sounds, non-distended, non-tender MSK/SPINE:  No gross deformities, no edema SKIN:  no rash, atraumatic PSYCH:  Appropriate speech and behavior  *Additional and/or pertinent findings included in MDM below  Diagnostic and Interventional Summary    EKG Interpretation  Date/Time:  Tuesday February 10 2020 07:13:37 EST Ventricular Rate:  73 PR Interval:    QRS Duration: 77 QT Interval:  438 QTC Calculation: 483 R Axis:   53 Text Interpretation: Sinus rhythm Baseline wander in lead(s) V5 Confirmed by Gerlene Fee [phone]) on 02/10/2020 7:24:39 AM      Cardiac Monitoring Interpretation:  Labs Reviewed  BASIC METABOLIC PANEL - Abnormal; Notable for the following components:      Result Value   Glucose, Bld 114 (*)    Creatinine, Ser 1.05 (*)    All other components within normal limits  CBC  TROPONIN I (HIGH SENSITIVITY)  TROPONIN I (HIGH SENSITIVITY)    DG Chest 2 View  Final Result      Medications  alum & mag hydroxide-simeth (MAALOX/MYLANTA) 200-200-20 MG/5ML suspension 30 mL (30 mLs Oral Given 02/10/20 0738)    And  lidocaine (XYLOCAINE) 2 % viscous mouth solution 15 mL (15 mLs Oral Given 02/10/20 0738)  aspirin chewable tablet 324 mg (324 mg Oral Given 02/10/20 [id number])     Procedures  /  Critical Care Procedures  ED Course and Medical Decision Making  I have reviewed the triage vital signs, the nursing notes, and pertinent available records from the EMR.  Pertinent labs & imaging results that were available during my care of the patient were reviewed by me and considered in my medical decision making (see below for details).     Considering GERD versus MSK versus less likely ACS given patient's lack of cardiac risk factors. No evidence of DVT, no shortness of breath, no hypoxia, doubt PE. Work-up pending.  11 AM update: Troponin is negative x2.  Patient is feeling better after GI cocktail.  EKG was without  concerning features.  Suspect noncardiac etiology, will follow up with PCP.  Barth Kirks. [place]onia Small, [place] [email or web address]  Final Clinical Impressions(s) / ED Diagnoses     ICD-10-CM   1. Chest pain, unspecified type  R07.9     ED Discharge Orders         Ordered    sucralfate (CARAFATE) 1 g tablet  4 times daily PRN     02/10/20 1117           Discharge Instructions Discussed with and Provided to Patient:     Discharge Instructions     You were evaluated  in the Emergency Department and after careful evaluation, we did not find any emergent condition requiring admission or further testing in the hospital.  Your exam/testing today was overall reassuring.  You can use the Carafate medication as needed for pain.  We recommend follow-up with your primary care doctor to discuss need for outpatient stress testing of the heart.  Please return to the Emergency Department if you experience any worsening of your condition.  We encourage you to follow up with a primary care provider.  Thank you for allowing [place] to be a part of your care.        Maudie Flakes, MD 02/10/20 1118

## 2020-05-28 IMAGING — DX DG CHEST 2V
2 series · 2 of 2 positions shown · non-contrast
Comparison: 05/25/2019

CLINICAL DATA: Chest pain. Pain between shoulder blades. Symptoms
for 2 hours.

EXAM:
CHEST - 2 VIEW

[chest pa]
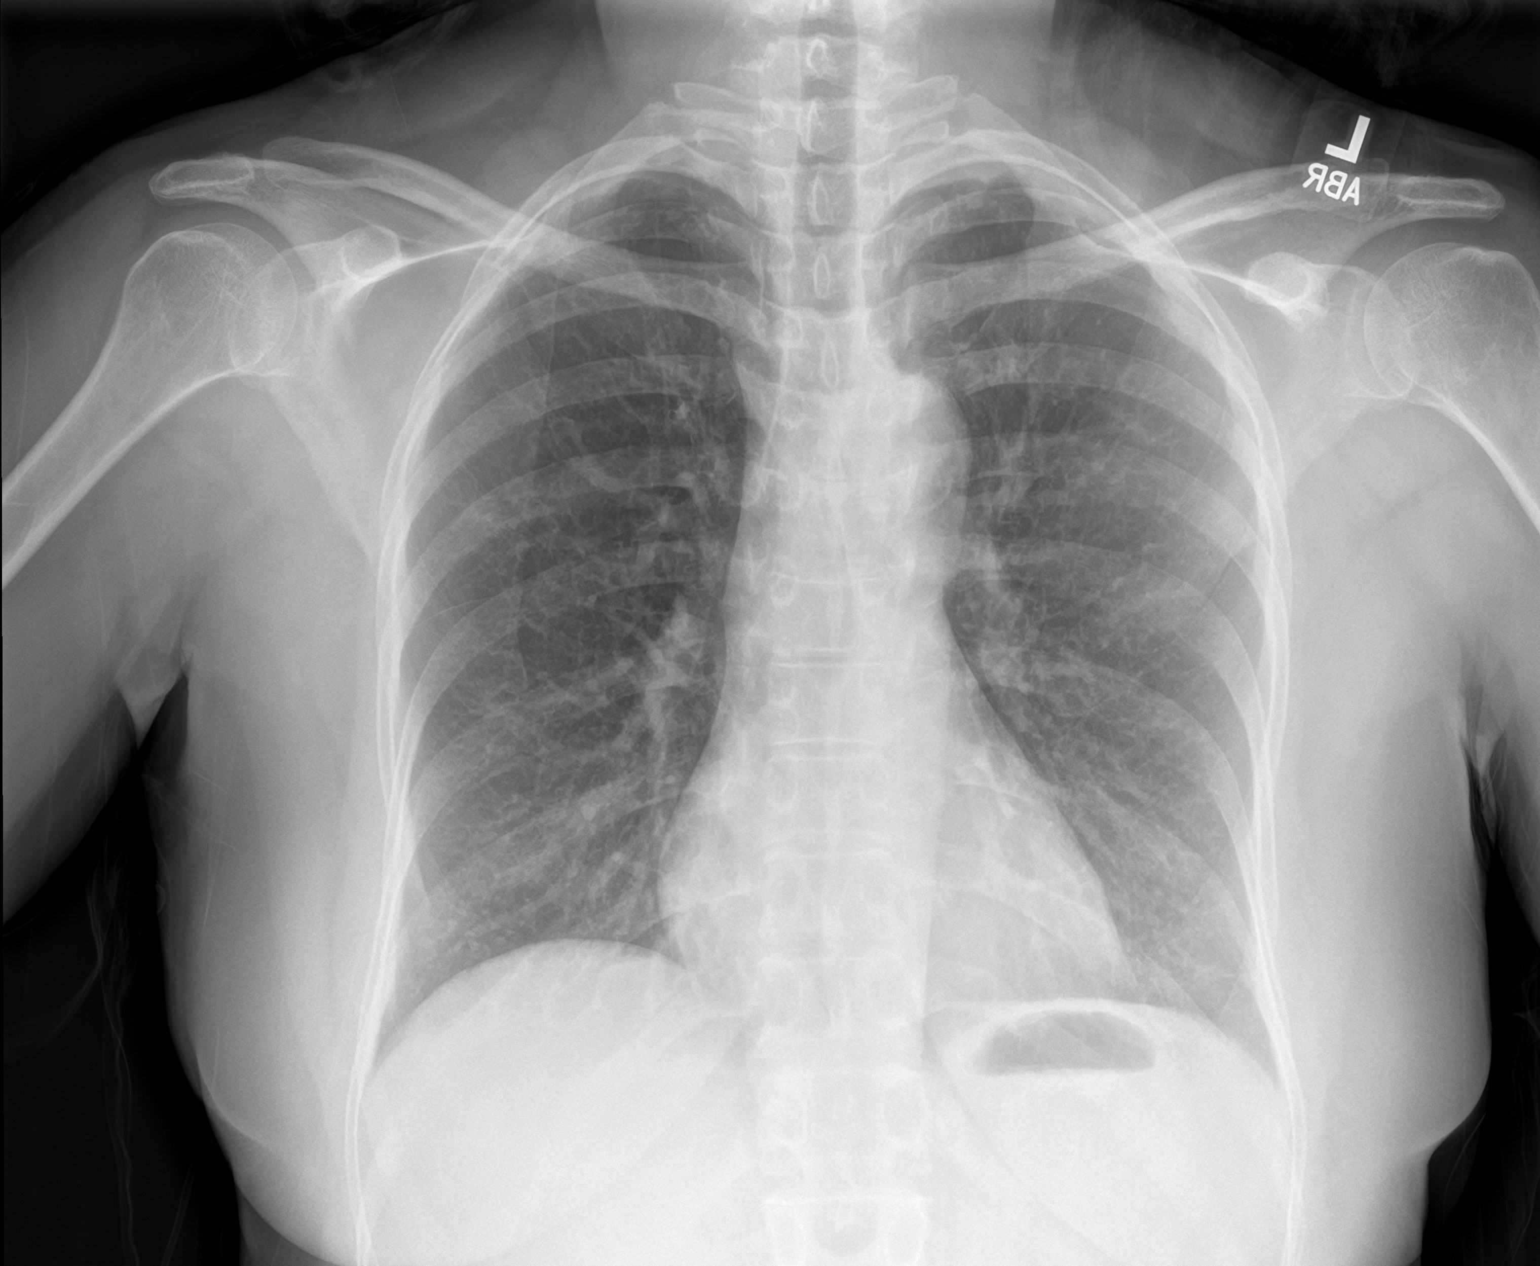

[chest lat]
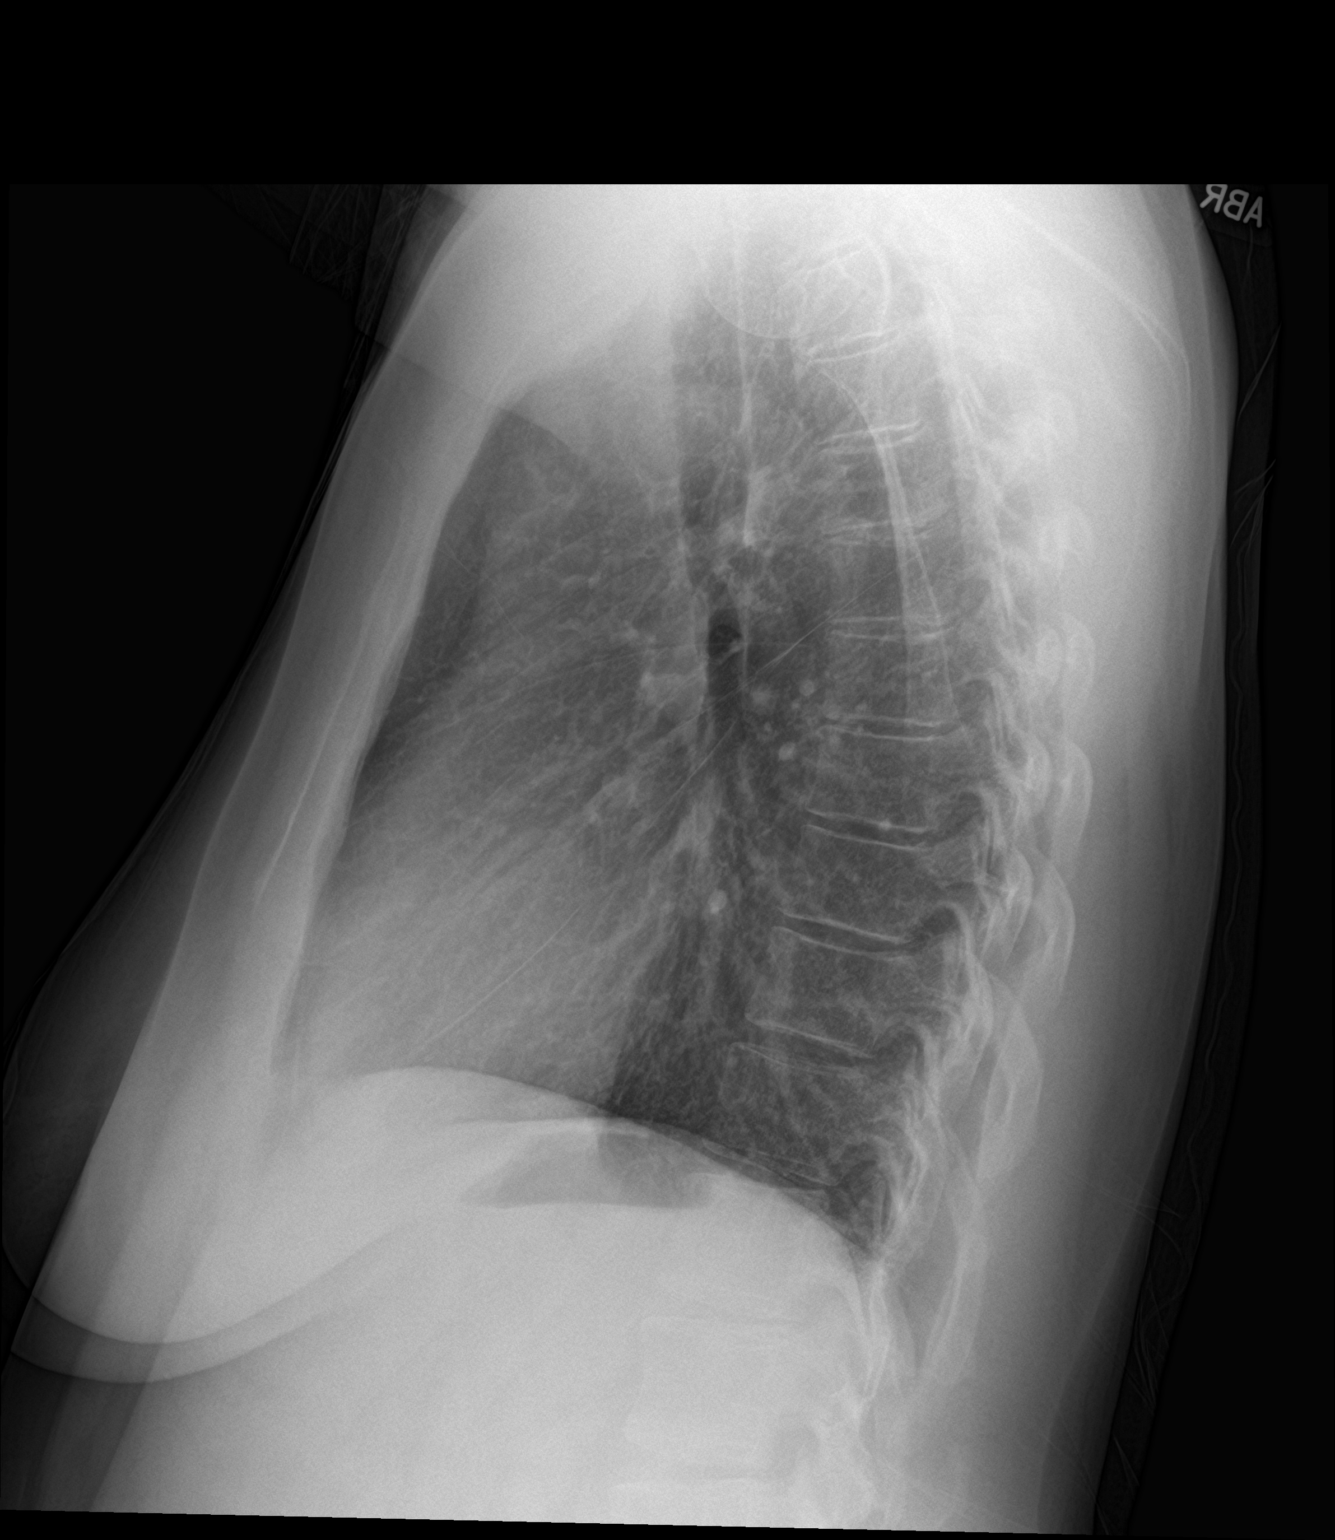

[2 of 2 positions shown; findings below may reference images not displayed]

FINDINGS: The cardiomediastinal contours are normal. Mild biapical
pleuroparenchymal scarring, stable. Pulmonary vasculature is normal.
No consolidation, pleural effusion, or pneumothorax. No acute
osseous abnormalities are seen.
IMPRESSION: No acute abnormality.

## 2020-08-02 ENCOUNTER — Encounter (HOSPITAL_COMMUNITY): Payer: Self-pay | Admitting: *Deleted

## 2020-08-02 ENCOUNTER — Other Ambulatory Visit: Payer: Self-pay

## 2020-08-02 ENCOUNTER — Emergency Department (HOSPITAL_COMMUNITY)
Admission: EM | Admit: 2020-08-02 | Discharge: 2020-08-02 | Disposition: A | Discharge status: Home / Self Care | Payer: Managed Care, Other (non HMO) | Attending: Emergency Medicine | Admitting: Emergency Medicine

## 2020-08-02 DIAGNOSIS — Z79899 Other long term (current) drug therapy: Secondary | ICD-10-CM | POA: Insufficient documentation

## 2020-08-02 DIAGNOSIS — I10 Essential (primary) hypertension: Secondary | ICD-10-CM | POA: Insufficient documentation

## 2020-08-02 DIAGNOSIS — R519 Headache, unspecified: Secondary | ICD-10-CM

## 2020-08-02 MED ORDER — IBUPROFEN 400 MG PO TABS
400.0000 mg | ORAL_TABLET | Freq: Once | ORAL | Status: AC
  Administered 2020-08-02: 400 mg via ORAL
  Filled 2020-08-02: qty 1

## 2020-08-02 NOTE — Discharge Instructions (Addendum)
Follow-up with your primary care provider in 2 days for continued valuation.  Return to the emergency room immediately for new or worsening symptoms or concerns, such as fevers, vomiting, chest pain, shortness of breath, new or worsening headache or any concerns at all.

## 2020-08-02 NOTE — ED Triage Notes (Signed)
Pain in back of head radiating into neck for 3 days

## 2020-08-02 NOTE — ED Provider Notes (Signed)
[facility] EMERGENCY DEPARTMENT Provider Note   CSN: [id number] Arrival date & time: 08/02/20  1242     History Chief Complaint  Patient presents with  . Headache    Vicki [name] is a 54 y.o. female.  HPI      54 year old female, with PMH of HTN, presents with a headache.  Patient states for the last 3 days she has had a headache of the left back of the head radiating into the left neck.  Patient states pain comes and goes.  At first she thought her headache was due to her blood pressure elevated but she states that she thinks it is because she is under a lot of stress.  She denies any visual changes, numbness, weakness, gait or speech difficulty.  She denies any associated chest pain, shortness of breath, nausea, vomiting, abdominal pain.  She denies any fevers, rashes, neck pain or stiffness.  Past Medical History:  Diagnosis Date  . Abnormal Pap smear   . Anemia   . Anxiety   . BV (bacterial vaginosis) 05/16/2013  . Fibroids    uterine  . Hypertension   . Mental disorder    depression and panic attacks  . Tachycardia     Patient Active Problem List   Diagnosis Date Noted  . S/P abdominal hysterectomy 02/25/2014  . Depression 07/25/2013  . Panic attacks 07/25/2013  . Fibroids 05/16/2013  . BV (bacterial vaginosis) 05/16/2013  . Anemia 04/03/2012  . Menorrhagia 04/03/2012  . Hypokalemia 04/03/2012  . UTI (lower urinary tract infection) 04/03/2012  . Emesis 04/03/2012    Past Surgical History:  Procedure Laterality Date  . ABDOMINAL HYSTERECTOMY    . BILATERAL SALPINGECTOMY Bilateral 02/25/2014   Procedure: BILATERAL SALPINGECTOMY;  Surgeon: Florian Buff, MD;  Location: [facility];  Service: Gynecology;  Laterality: Bilateral;  . DILATION AND CURETTAGE OF UTERUS    . SUPRACERVICAL ABDOMINAL HYSTERECTOMY N/A 02/25/2014   Procedure: HYSTERECTOMY SUPRACERVICAL ABDOMINAL;  Surgeon: Florian Buff, MD;  Location: [facility];  Service: Gynecology;  Laterality: N/A;  . TUBAL  LIGATION       OB History    Gravida  7   Para  5   Term      Preterm      AB  2   Living  5     SAB  2   TAB      Ectopic      Multiple      Live Births              Family History  Problem Relation Age of Onset  . Congestive Heart Failure Mother   . Congestive Heart Failure Maternal Grandmother   . Cancer Paternal Grandmother        ovarian    Social History   Tobacco Use  . Smoking status: Never Smoker  . Smokeless tobacco: Never Used  Vaping Use  . Vaping Use: Never used  Substance Use Topics  . Alcohol use: Yes    Comment: 1 beer daily   . Drug use: No    Home Medications Prior to Admission medications   Medication Sig Start Date End Date Taking? Authorizing Provider  amLODipine (NORVASC) 5 MG tablet Take 1 tablet (5 mg total) by mouth daily. 11/08/19   Mesner, Corene Cornea, MD  carvedilol (COREG) 3.125 MG tablet Take 3.125 mg by mouth 2 (two) times daily. 08/20/18   [provider]  citalopram (CELEXA) 20 MG tablet Take 20 mg by  mouth every evening.     [provider]  famotidine (PEPCID) 20 MG tablet Take 1 tablet (20 mg total) by mouth 2 (two) times daily. Patient taking differently: Take 20 mg by mouth daily as needed for heartburn or indigestion.  07/18/19   Milton Ferguson, MD  gabapentin (NEURONTIN) 100 MG capsule Take 100 mg by mouth 2 (two) times daily. 12/27/19   [provider]  hydrOXYzine (ATARAX/VISTARIL) 25 MG tablet Take 25 mg by mouth 3 (three) times daily as needed. for anxiety 08/20/18   [provider]  meclizine (ANTIVERT) 25 MG tablet Take 1 tablet (25 mg total) by mouth 3 (three) times daily as needed for dizziness. 11/09/18   Nat Christen, MD  methocarbamol (ROBAXIN) 500 MG tablet Take 1 tablet (500 mg total) by mouth every 8 (eight) hours as needed for muscle spasms. 12/27/19   Davonna Belling, MD  predniSONE (DELTASONE) 20 MG tablet Take 2 tablets (40 mg total) by mouth daily. Patient not taking:  Reported on 02/10/2020 12/27/19   Davonna Belling, MD  sucralfate (CARAFATE) 1 g tablet Take 1 tablet (1 g total) by mouth 4 (four) times daily as needed. 02/10/20   Maudie Flakes, MD  Vitamin D, Ergocalciferol, (DRISDOL) 1.25 MG (50000 UT) CAPS capsule TAKE 1 CAPSULE BY MOUTH ONCE A WEEK FOR LOW VITAMIN D 04/22/19   [provider]    Allergies    Patient has no known allergies.  Review of Systems   Review of Systems  Constitutional: Negative for chills and fever.  HENT: Negative for rhinorrhea and sore throat.   Eyes: Negative for visual disturbance.  Respiratory: Negative for cough and shortness of breath.   Cardiovascular: Negative for chest pain and leg swelling.  Gastrointestinal: Negative for abdominal pain, diarrhea, nausea and vomiting.  Genitourinary: Negative for dysuria, frequency and urgency.  Musculoskeletal: Negative for neck pain.  Skin: Negative for rash and wound.  Neurological: Positive for headaches. Negative for numbness.  All other systems reviewed and are negative.   Physical Exam Updated Vital Signs BP (!) 157/85 (BP Location: Right Arm)   Pulse (!) 59   Temp 97.8 F (36.6 C) (Oral)   Resp 12   Ht 5\' 7"  (1.702 m)   Wt 72.6 kg   LMP 02/09/2014   SpO2 100%   BMI 25.06 kg/m   Physical Exam Vitals and nursing note reviewed.  Constitutional:      Appearance: She is well-developed.  HENT:     Head: Normocephalic and atraumatic.  Eyes:     Conjunctiva/sclera: Conjunctivae normal.  Neck:     Trachea: Trachea normal.     Meningeal: Brudzinski's sign and Kernig's sign absent.  Cardiovascular:     Rate and Rhythm: Normal rate and regular rhythm.     Heart sounds: Normal heart sounds. No murmur heard.   Pulmonary:     Effort: Pulmonary effort is normal. No respiratory distress.     Breath sounds: Normal breath sounds. No wheezing or rales.  Abdominal:     General: Bowel sounds are normal. There is no distension.     Palpations: Abdomen is  soft.     Tenderness: There is no abdominal tenderness.  Musculoskeletal:        General: No tenderness or deformity. Normal range of motion.     Cervical back: Full passive range of motion without pain and neck supple.  Skin:    General: Skin is warm and dry.     Findings: No  erythema or rash.  Neurological:     General: No focal deficit present.     Mental Status: She is alert and oriented to person, place, and time.     GCS: GCS eye subscore is 4. GCS verbal subscore is 5. GCS motor subscore is 6.     Cranial Nerves: Cranial nerves are intact.     Sensory: Sensation is intact.     Motor: Motor function is intact.     Coordination: Coordination is intact.     Gait: Gait is intact.  Psychiatric:        Behavior: Behavior normal.     ED Results / Procedures / Treatments   Labs (all labs ordered are listed, but only abnormal results are displayed) Labs Reviewed - No data to display  EKG None  Radiology No results found.  Procedures Procedures (including critical care time)  Medications Ordered in ED Medications  ibuprofen (ADVIL) tablet 400 mg (has no administration in time range)    ED Course  I have reviewed the triage vital signs and the nursing notes.  Pertinent labs & imaging results that were available during my care of the patient were reviewed by me and considered in my medical decision making (see chart for details).    MDM Rules/Calculators/A&P                           Patient presents with headache.  Neuro exam nonfocal, nonlateralizing.  Vital signs stable.  Patient has no meningeal signs.  She was offered treatment with migraine cocktail and further observation and possible imaging.  After question of risk and benefits alternatives, patient declined further treatment, imaging.  She states that she needs a note to return to work.  She was given ibuprofen and is feeling better.  History physical not consistent with CVA, subarachnoid, meningitis.  She is  ready and stable for discharge.   At this time there does not appear to be any evidence of an acute emergency medical condition and the patient appears stable for discharge with appropriate outpatient follow up.Diagnosis was discussed with patient who verbalizes understanding and is agreeable to discharge.   Final Clinical Impression(s) / ED Diagnoses Final diagnoses:  None    Rx / DC Orders ED Discharge Orders    None       Rachel Moulds 08/02/20 2026    Margette Fast, MD 08/04/20 1943

## 2020-08-25 ENCOUNTER — Encounter (HOSPITAL_COMMUNITY): Payer: Self-pay | Admitting: Emergency Medicine

## 2020-08-25 ENCOUNTER — Emergency Department (HOSPITAL_COMMUNITY): Payer: BC Managed Care – PPO

## 2020-08-25 ENCOUNTER — Other Ambulatory Visit: Payer: Self-pay

## 2020-08-25 ENCOUNTER — Emergency Department (HOSPITAL_COMMUNITY)
Admission: EM | Admit: 2020-08-25 | Discharge: 2020-08-25 | Disposition: A | Discharge status: Home / Self Care | Payer: BC Managed Care – PPO | Attending: Emergency Medicine | Admitting: Emergency Medicine

## 2020-08-25 DIAGNOSIS — I10 Essential (primary) hypertension: Secondary | ICD-10-CM | POA: Insufficient documentation

## 2020-08-25 DIAGNOSIS — Z760 Encounter for issue of repeat prescription: Secondary | ICD-10-CM | POA: Insufficient documentation

## 2020-08-25 DIAGNOSIS — M25552 Pain in left hip: Secondary | ICD-10-CM | POA: Diagnosis not present

## 2020-08-25 MED ORDER — CARVEDILOL 3.125 MG PO TABS: 3.1250 mg | ORAL_TABLET | Freq: Two times a day (BID) | ORAL | 0 refills | Status: DC

## 2020-08-25 MED ORDER — IBUPROFEN 400 MG PO TABS: 400.0000 mg | ORAL_TABLET | Freq: Four times a day (QID) | ORAL | 0 refills | Status: DC | PRN

## 2020-08-25 NOTE — ED Provider Notes (Signed)
[facility] EMERGENCY DEPARTMENT Provider Note   CSN: [id number] Arrival date & time: 08/25/20  7106     History Chief Complaint  Patient presents with  . Hip Pain    Vicki [name] is a 54 y.o. female with history of HTN, depression and anxiety, presenting with left lateral hip pain which started 3 days ago when she woke up.  She denies any falls or other injury to this site. She describes an achy pain worsened with movement and weight bearing, resolved at rest.  She has a history of low back pain with sciatica but does not have low back pain at this time.  She has taken tylenol and used her gabapentin (for sciatica) also a muscle rub without improvement in symptoms.  No weakness or numbness in the legs, no urinary complaints.  Also has run out of her carvedilol and would like a refill.    The history is provided by the patient.       Past Medical History:  Diagnosis Date  . Abnormal Pap smear   . Anemia   . Anxiety   . BV (bacterial vaginosis) 05/16/2013  . Fibroids    uterine  . Hypertension   . Mental disorder    depression and panic attacks  . Tachycardia     Patient Active Problem List   Diagnosis Date Noted  . S/P abdominal hysterectomy 02/25/2014  . Depression 07/25/2013  . Panic attacks 07/25/2013  . Fibroids 05/16/2013  . BV (bacterial vaginosis) 05/16/2013  . Anemia 04/03/2012  . Menorrhagia 04/03/2012  . Hypokalemia 04/03/2012  . UTI (lower urinary tract infection) 04/03/2012  . Emesis 04/03/2012    Past Surgical History:  Procedure Laterality Date  . ABDOMINAL HYSTERECTOMY    . BILATERAL SALPINGECTOMY Bilateral 02/25/2014   Procedure: BILATERAL SALPINGECTOMY;  Surgeon: Florian Buff, MD;  Location: [facility];  Service: Gynecology;  Laterality: Bilateral;  . DILATION AND CURETTAGE OF UTERUS    . SUPRACERVICAL ABDOMINAL HYSTERECTOMY N/A 02/25/2014   Procedure: HYSTERECTOMY SUPRACERVICAL ABDOMINAL;  Surgeon: Florian Buff, MD;  Location: [facility];  Service:  Gynecology;  Laterality: N/A;  . TUBAL LIGATION       OB History    Gravida  7   Para  5   Term      Preterm      AB  2   Living  5     SAB  2   TAB      Ectopic      Multiple      Live Births              Family History  Problem Relation Age of Onset  . Congestive Heart Failure Mother   . Congestive Heart Failure Maternal Grandmother   . Cancer Paternal Grandmother        ovarian    Social History   Tobacco Use  . Smoking status: Never Smoker  . Smokeless tobacco: Never Used  Vaping Use  . Vaping Use: Never used  Substance Use Topics  . Alcohol use: Yes    Comment: 1 beer daily   . Drug use: No    Home Medications Prior to Admission medications   Medication Sig Start Date End Date Taking? Authorizing Provider  amLODipine (NORVASC) 5 MG tablet Take 1 tablet (5 mg total) by mouth daily. 11/08/19   Mesner, Corene Cornea, MD  carvedilol (COREG) 3.125 MG tablet Take 1 tablet (3.125 mg total) by mouth 2 (two) times daily with  a meal. 08/25/20   Anniyah Mood, Almyra Free, PA-C  citalopram (CELEXA) 20 MG tablet Take 20 mg by mouth every evening.     [provider]  famotidine (PEPCID) 20 MG tablet Take 1 tablet (20 mg total) by mouth 2 (two) times daily. Patient taking differently: Take 20 mg by mouth daily as needed for heartburn or indigestion.  07/18/19   Milton Ferguson, MD  gabapentin (NEURONTIN) 100 MG capsule Take 100 mg by mouth 2 (two) times daily. 12/27/19   [provider]  hydrOXYzine (ATARAX/VISTARIL) 25 MG tablet Take 25 mg by mouth 3 (three) times daily as needed. for anxiety 08/20/18   [provider]  ibuprofen (ADVIL) 400 MG tablet Take 1 tablet (400 mg total) by mouth every 6 (six) hours as needed. 08/25/20   Evalee Jefferson, PA-C  meclizine (ANTIVERT) 25 MG tablet Take 1 tablet (25 mg total) by mouth 3 (three) times daily as needed for dizziness. 11/09/18   Nat Christen, MD  methocarbamol (ROBAXIN) 500 MG tablet Take 1 tablet (500 mg total) by  mouth every 8 (eight) hours as needed for muscle spasms. 12/27/19   Davonna Belling, MD  predniSONE (DELTASONE) 20 MG tablet Take 2 tablets (40 mg total) by mouth daily. Patient not taking: Reported on 02/10/2020 12/27/19   Davonna Belling, MD  sucralfate (CARAFATE) 1 g tablet Take 1 tablet (1 g total) by mouth 4 (four) times daily as needed. 02/10/20   Maudie Flakes, MD  Vitamin D, Ergocalciferol, (DRISDOL) 1.25 MG (50000 UT) CAPS capsule TAKE 1 CAPSULE BY MOUTH ONCE A WEEK FOR LOW VITAMIN D 04/22/19   [provider]    Allergies    Patient has no known allergies.  Review of Systems   Review of Systems  Constitutional: Negative for chills and fever.  HENT: Negative.   Eyes: Negative.   Respiratory: Negative.   Cardiovascular: Negative.   Gastrointestinal: Negative.   Genitourinary: Negative.  Negative for dysuria.  Musculoskeletal: Positive for arthralgias. Negative for joint swelling and neck pain.  Skin: Negative.  Negative for rash and wound.  Neurological: Negative for weakness, numbness and headaches.  Psychiatric/Behavioral: Negative.     Physical Exam Updated Vital Signs BP 137/87 (BP Location: Right Arm)   Pulse 63   Temp 98.2 F (36.8 C) (Oral)   Resp 18   Ht 5\' 7"  (1.702 m)   Wt 72.6 kg   LMP 02/09/2014   SpO2 99%   BMI 25.06 kg/m   Physical Exam Constitutional:      Appearance: She is well-developed.  HENT:     Head: Atraumatic.  Cardiovascular:     Comments: Pulses equal bilaterally Musculoskeletal:        General: Tenderness present. No swelling or deformity.     Cervical back: Normal range of motion.     Left hip: No deformity, bony tenderness or crepitus. Decreased range of motion.     Right lower leg: No swelling. No edema.     Left lower leg: No swelling. No edema.     Comments: Pain at the left greater trochanter with active and passive internal/ext rotation and flexion.  No groin pain.    Skin:    General: Skin is warm and dry.    Neurological:     Mental Status: She is alert.     Sensory: Sensation is intact. No sensory deficit.     Motor: Motor function is intact.     Deep Tendon Reflexes: Reflexes normal.  ED Results / Procedures / Treatments   Labs (all labs ordered are listed, but only abnormal results are displayed) Labs Reviewed - No data to display  EKG None  Radiology DG Hip Unilat With Pelvis 2-3 Views Left  Result Date: 08/25/2020 CLINICAL DATA:  Left hip pain. EXAM: DG HIP (WITH OR WITHOUT PELVIS) 2-3V LEFT COMPARISON:  None. FINDINGS: No evidence of an acute fracture. No subluxation or dislocation. There is asymmetric curvilinear sclerotic density in the subcapital region of the left femoral head. Degenerative changes noted in the symphysis pubis. SI joints unremarkable. IMPRESSION: Asymmetric curvilinear sclerotic density in the subcapital region of the left femoral head, potentially related to stress response. MRI of the left hip recommended to further evaluate and would be the study of choice over CT. Electronically Signed   By: Misty Stanley M.D.   On: 08/25/2020 07:48    Procedures Procedures (including critical care time)  Medications Ordered in ED Medications - No data to display  ED Course  I have reviewed the triage vital signs and the nursing notes.  Pertinent labs & imaging results that were available during my care of the patient were reviewed by me and considered in my medical decision making (see chart for details).    MDM Rules/Calculators/A&P                          Discussed plain film findings and recommendations for MRI. Pt in between pcp's currently, will have insurance with her new job next month. Discussed obtaining mri today vs as an outpt once she has coverage. Pt prefers to wait.  She was also given referral to Reva B[facility] in the interim for assistance with community resources as may be available.   No emergent findings or concerns on exam suggesting need for MRI  today.  Location of pain and exam findings suggest trochanter issue such as trochanteric bursitis, she has no hip joint pain, sclerotic lesion does not seem to be the location of her sx.  Refill given of coreg, ibuprofen, heat tx.  May continue tylenol and muscle rub as well.  Final Clinical Impression(s) / ED Diagnoses Final diagnoses:  Left hip pain  Medication refill    Rx / DC Orders ED Discharge Orders         Ordered    carvedilol (COREG) 3.125 MG tablet  2 times daily with meals        08/25/20 0938    ibuprofen (ADVIL) 400 MG tablet  Every 6 hours PRN        08/25/20 0938           Evalee Jefferson, PA-C 08/25/20 1009    Long, Wonda Olds, MD 08/26/20 1047

## 2020-08-25 NOTE — Discharge Instructions (Addendum)
You have been given a refill of your carvedilol.  Also,  you may try ibuprofen as prescribed for your hip pain.  Your xray shows a band of thickened calcium as we viewed in your left femoral head that will need further evaluation.  As discussed, an outpatient MRI is recommended as further evaluation of this finding.  This can be arranged by your new primary provider.  In the interim, use the ibuprofen prescribed in addition to using tylenol and muscle rub as you are doing.  However, stop using the ibuprofen if you develop any acid reflux or abdominal pain.    [facility]  [address] [place], [place] [address] [phone]  Services [facility] offers a variety of basic health services.  Services include but are not limited to: Blood pressure checks  Heart rate checks  Blood sugar checks  Urine analysis  Rapid strep tests  Pregnancy tests.  Health education and referrals  People needing more complex services will be directed to a physician online. Using these virtual visits, doctors can evaluate and prescribe medicine and treatments. There will be no medication on-site, though [facility] will help patients fill their prescriptions at little to no cost.   For More information please go to: [email or web address]

## 2020-08-25 NOTE — ED Triage Notes (Signed)
Pt reports left hip pain when trying to get out of bed x3 days. Pt denies any known injury. Pt able to bear weight but with increased pain.

## 2020-09-06 ENCOUNTER — Other Ambulatory Visit: Payer: Self-pay

## 2020-09-14 ENCOUNTER — Other Ambulatory Visit: Payer: Self-pay

## 2020-09-14 ENCOUNTER — Emergency Department (HOSPITAL_COMMUNITY): Payer: BC Managed Care – PPO

## 2020-09-14 ENCOUNTER — Encounter (HOSPITAL_COMMUNITY): Payer: Self-pay | Admitting: *Deleted

## 2020-09-14 ENCOUNTER — Emergency Department (HOSPITAL_COMMUNITY)
Admission: EM | Admit: 2020-09-14 | Discharge: 2020-09-14 | Disposition: A | Discharge status: Home / Self Care | Payer: BC Managed Care – PPO | Attending: Emergency Medicine | Admitting: Emergency Medicine

## 2020-09-14 DIAGNOSIS — M84352A Stress fracture, left femur, initial encounter for fracture: Secondary | ICD-10-CM | POA: Diagnosis not present

## 2020-09-14 DIAGNOSIS — M84359A Stress fracture, hip, unspecified, initial encounter for fracture: Secondary | ICD-10-CM

## 2020-09-14 DIAGNOSIS — I10 Essential (primary) hypertension: Secondary | ICD-10-CM | POA: Insufficient documentation

## 2020-09-14 DIAGNOSIS — M25552 Pain in left hip: Secondary | ICD-10-CM | POA: Diagnosis present

## 2020-09-14 DIAGNOSIS — Z79899 Other long term (current) drug therapy: Secondary | ICD-10-CM | POA: Diagnosis not present

## 2020-09-14 MED ORDER — LORAZEPAM 2 MG/ML IJ SOLN
1.0000 mg | Freq: Once | INTRAMUSCULAR | Status: AC
  Administered 2020-09-14: 1 mg via INTRAMUSCULAR
  Filled 2020-09-14: qty 1

## 2020-09-14 MED ORDER — HYDROCODONE-ACETAMINOPHEN 5-325 MG PO TABS: 1.0000 | ORAL_TABLET | Freq: Four times a day (QID) | ORAL | 0 refills | Status: DC | PRN

## 2020-09-14 MED ORDER — HYDROCODONE-ACETAMINOPHEN 5-325 MG PO TABS
1.0000 | ORAL_TABLET | Freq: Once | ORAL | Status: AC
  Administered 2020-09-14: 1 via ORAL
  Filled 2020-09-14: qty 1

## 2020-09-14 MED ORDER — KETOROLAC TROMETHAMINE 60 MG/2ML IM SOLN
60.0000 mg | Freq: Once | INTRAMUSCULAR | Status: AC
  Administered 2020-09-14: 60 mg via INTRAMUSCULAR
  Filled 2020-09-14: qty 2

## 2020-09-14 MED ORDER — KETOROLAC TROMETHAMINE 30 MG/ML IJ SOLN: 30.0000 mg | Freq: Once | INTRAMUSCULAR | Status: DC

## 2020-09-14 NOTE — ED Triage Notes (Signed)
Pt c/o pain with walking to left left and hip; pt states she was seen here earlier in the month and was told she has a stress fracture to her left femur

## 2020-09-14 NOTE — ED Provider Notes (Signed)
[facility] EMERGENCY DEPARTMENT Provider Note   CSN: [id number] Arrival date & time: 09/14/20  1426     History Chief Complaint  Patient presents with  . Hip Pain    Vicki [name] is a 54 y.o. female.  MARNI FRANZONI is a 54 y.o. female with a history of hypertension, tachycardia, uterine fibroids, anemia, anxiety, who presents to the emergency department for continued left hip pain. She was seen in the emergency department initially on 9/1. At this time she had been having left lateral hip pain for about 3 days, not associated with any trauma injury or fall. She had an x-ray which showed a curvilinear sclerotic density in the subcapital region of the left femoral head concerning for possible stress response. MRI was recommended for further evaluation, but after shared decision-making between provider and patient she wanted to try conservative treatment and get MRI done as an outpatient once her insurance kicked in from her new job. She returns today due to worsening pain over the past 3 weeks. Pain is still located in the left lateral hip, but now it is significantly worse in particular with weightbearing. Patient states that she has found it more difficult to walk. She states at work she is required to be on her feet and walking all day long. She states occasionally when pain is severe will radiate down the side of her leg but she denies any associated back pain numbness weakness or tingling. She has not had any new injury or trauma to the leg. No swelling, redness or skin changes. She has been taking ibuprofen and Tylenol without much improvement. Has not followed up with anyone outpatient. No other aggravating or alleviating factors.     Past Medical History:  Diagnosis Date  . Abnormal Pap smear   . Anemia   . Anxiety   . BV (bacterial vaginosis) 05/16/2013  . Fibroids    uterine  . Hypertension   . Mental disorder    depression and panic attacks  . Tachycardia     Patient  Active Problem List   Diagnosis Date Noted  . S/P abdominal hysterectomy 02/25/2014  . Depression 07/25/2013  . Panic attacks 07/25/2013  . Fibroids 05/16/2013  . BV (bacterial vaginosis) 05/16/2013  . Anemia 04/03/2012  . Menorrhagia 04/03/2012  . Hypokalemia 04/03/2012  . UTI (lower urinary tract infection) 04/03/2012  . Emesis 04/03/2012    Past Surgical History:  Procedure Laterality Date  . ABDOMINAL HYSTERECTOMY    . BILATERAL SALPINGECTOMY Bilateral 02/25/2014   Procedure: BILATERAL SALPINGECTOMY;  Surgeon: Florian Buff, MD;  Location: [facility];  Service: Gynecology;  Laterality: Bilateral;  . DILATION AND CURETTAGE OF UTERUS    . SUPRACERVICAL ABDOMINAL HYSTERECTOMY N/A 02/25/2014   Procedure: HYSTERECTOMY SUPRACERVICAL ABDOMINAL;  Surgeon: Florian Buff, MD;  Location: [facility];  Service: Gynecology;  Laterality: N/A;  . TUBAL LIGATION       OB History    Gravida  7   Para  5   Term      Preterm      AB  2   Living  5     SAB  2   TAB      Ectopic      Multiple      Live Births              Family History  Problem Relation Age of Onset  . Congestive Heart Failure Mother   . Congestive Heart Failure Maternal Grandmother   .  Cancer Paternal Grandmother        ovarian    Social History   Tobacco Use  . Smoking status: Never Smoker  . Smokeless tobacco: Never Used  Vaping Use  . Vaping Use: Never used  Substance Use Topics  . Alcohol use: Yes    Comment: 1 beer daily   . Drug use: No    Home Medications Prior to Admission medications   Medication Sig Start Date End Date Taking? Authorizing Provider  amLODipine (NORVASC) 5 MG tablet Take 1 tablet (5 mg total) by mouth daily. 11/08/19   Mesner, Corene Cornea, MD  carvedilol (COREG) 3.125 MG tablet Take 1 tablet (3.125 mg total) by mouth 2 (two) times daily with a meal. 08/25/20   Idol, Almyra Free, PA-C  citalopram (CELEXA) 20 MG tablet Take 20 mg by mouth every evening.     [provider]    famotidine (PEPCID) 20 MG tablet Take 1 tablet (20 mg total) by mouth 2 (two) times daily. Patient taking differently: Take 20 mg by mouth daily as needed for heartburn or indigestion.  07/18/19   Milton Ferguson, MD  gabapentin (NEURONTIN) 100 MG capsule Take 100 mg by mouth 2 (two) times daily. 12/27/19   [provider]  hydrOXYzine (ATARAX/VISTARIL) 25 MG tablet Take 25 mg by mouth 3 (three) times daily as needed. for anxiety 08/20/18   [provider]  ibuprofen (ADVIL) 400 MG tablet Take 1 tablet (400 mg total) by mouth every 6 (six) hours as needed. 08/25/20   Evalee Jefferson, PA-C  meclizine (ANTIVERT) 25 MG tablet Take 1 tablet (25 mg total) by mouth 3 (three) times daily as needed for dizziness. 11/09/18   Nat Christen, MD  methocarbamol (ROBAXIN) 500 MG tablet Take 1 tablet (500 mg total) by mouth every 8 (eight) hours as needed for muscle spasms. 12/27/19   Davonna Belling, MD  predniSONE (DELTASONE) 20 MG tablet Take 2 tablets (40 mg total) by mouth daily. Patient not taking: Reported on 02/10/2020 12/27/19   Davonna Belling, MD  sucralfate (CARAFATE) 1 g tablet Take 1 tablet (1 g total) by mouth 4 (four) times daily as needed. 02/10/20   Maudie Flakes, MD  Vitamin D, Ergocalciferol, (DRISDOL) 1.25 MG (50000 UT) CAPS capsule TAKE 1 CAPSULE BY MOUTH ONCE A WEEK FOR LOW VITAMIN D 04/22/19   [provider]    Allergies    Patient has no known allergies.  Review of Systems   Review of Systems  Constitutional: Negative for chills and fever.  Cardiovascular: Negative for leg swelling.  Musculoskeletal: Positive for arthralgias. Negative for back pain and joint swelling.  Skin: Negative for color change and rash.  Neurological: Negative for weakness and numbness.    Physical Exam Updated Vital Signs BP (!) 159/83 (BP Location: Right Arm)   Pulse 68   Temp 98.5 F (36.9 C) (Oral)   Resp 18   Ht 5\' 7"  (1.702 m)   Wt 70.3 kg   LMP 02/09/2014   SpO2 100%   BMI  24.28 kg/m   Physical Exam Vitals and nursing note reviewed.  Constitutional:      General: She is not in acute distress.    Appearance: Normal appearance. She is well-developed and normal weight. She is not ill-appearing or diaphoretic.  HENT:     Head: Normocephalic and atraumatic.  Eyes:     General:        Right eye: No discharge.        Left  eye: No discharge.  Pulmonary:     Effort: Pulmonary effort is normal. No respiratory distress.  Musculoskeletal:        General: Tenderness present.     Comments: Tenderness over the left hip primarily over the trochanter. Patient has difficulty standing and has pain with any range of motion. No swelling noted in the lower extremity, no pain at the ankle or knee. Distal pulses 2+, leg is warm and well perfused with normal sensation and strength.  Skin:    General: Skin is warm and dry.  Neurological:     Mental Status: She is alert and oriented to person, place, and time.     Coordination: Coordination normal.  Psychiatric:        Behavior: Behavior normal.     ED Results / Procedures / Treatments   Labs (all labs ordered are listed, but only abnormal results are displayed) Labs Reviewed - No data to display  EKG None  Radiology MR HIP LEFT WO CONTRAST  Result Date: 09/14/2020 CLINICAL DATA:  Left hip pain with walking, abnormal x-ray suggesting stress fracture EXAM: MR OF THE LEFT HIP WITHOUT CONTRAST TECHNIQUE: Multiplanar, multisequence MR imaging was performed. No intravenous contrast was administered. COMPARISON:  08/25/2020 FINDINGS: Bones: There is diffuse edema throughout the left femoral neck and head, consistent with femoral neck stress fracture seen on previous radiograph. I do not see any cortical discontinuity to suggest a displaced fracture. Right hip is unremarkable. The remainder of the bony pelvis demonstrates normal signal characteristics. Articular cartilage and labrum Articular cartilage:  No focal abnormality.  Labrum:  No focal abnormality. Joint or bursal effusion Joint effusion:  There is a moderate left hip effusion. Bursae: No evidence of bursitis. Muscles and tendons Muscles and tendons: No abnormal signal. No evidence of acute injury. Other findings Miscellaneous: Vascular structures demonstrate normal signal flow voids. No pathologic adenopathy. IMPRESSION: 1. Diffuse edema throughout the left femoral neck and head, compatible with the stress fracture seen on previous x-ray. No cortical discontinuity to suggest a displaced fracture. Moderate associated left hip effusion. Electronically Signed   By: Randa Ngo M.D.   On: 09/14/2020 19:14    Procedures Procedures (including critical care time)  Medications Ordered in ED Medications - No data to display  ED Course  I have reviewed the triage vital signs and the nursing notes.  Pertinent labs & imaging results that were available during my care of the patient were reviewed by me and considered in my medical decision making (see chart for details).    MDM Rules/Calculators/A&P                          54 year old female presents with continued left hip pain. She was seen in the ED for this initially on 9/1, had x-ray completed that showed concern for potential stress fracture, recommended further MRI, at that time patient had shared decision-making discussion with provider and elected to treat symptomatically and get MRI as an outpatient. She returns today due to worsening hip pain, now difficulty bearing weight. On exam she has tenderness over the trochanteric prior merrily without palpable deformity or overlying skin changes. Pain is worse with any range of motion. Will get MRI to better assess hip pain. Toradol and Norco given for pain.  MRI shows diffuse edema throughout the left femoral neck and head compatible with a stress fracture seen on previous x-ray, no cortical discontinuity to suggest a displaced fracture, moderate associated  left  hip effusion.  Dr. Melina Copa discussed MRI results with Dr. Aline Brochure with orthopedics who recommends patient use a walker with any ambulation, and he will see the patient tomorrow morning at 8:30 for further evaluation.   I discussed this plan with the patient. Will prescribe short course of pain management and I have given her instructions to get a walker this evening. Discussed plan for follow-up tomorrow morning with Dr. Aline Brochure, patient expresses understanding and agreement with plan. Discharged home in good condition.  Final Clinical Impression(s) / ED Diagnoses Final diagnoses:  Stress fracture of hip, initial encounter    Rx / DC Orders ED Discharge Orders         Ordered    HYDROcodone-acetaminophen (NORCO) 5-325 MG tablet  Every 6 hours PRN        09/14/20 2005    Walker standard        09/14/20 2005           Jacqlyn Larsen, PA-C 09/14/20 2341    Hayden Rasmussen, MD 09/15/20 1559

## 2020-09-14 NOTE — Progress Notes (Signed)
Patient ID: Vicki [name], female   DOB: 11/07/1966, 54 y.o.   MRN: [id number] ER physician called me about Ms. Leflore who has ongoing hip pain is been to the ER on 2 occasions had an MRI today that showed a stress reaction in the left proximal femur.  It is written in the radiology report that it is a stress fracture which is not loose upon my review it is more of a stress reaction from overuse  Patient will see me tomorrow to discuss treatment options.  She should be protected weightbearing with a walker and she should come out of work for 6 weeks

## 2020-09-14 NOTE — Discharge Instructions (Signed)
Use walker and try and stay off of your feet as much as possible. Use ibuprofen and Norco as needed for breakthrough pain medication. You have an appointment tomorrow morning at 8:30 AM with Dr. Aline Brochure for further evaluation. MRI shows a stress fracture of your left hip

## 2020-09-15 ENCOUNTER — Ambulatory Visit: Payer: BC Managed Care – PPO | Admitting: Orthopedic Surgery

## 2020-09-16 ENCOUNTER — Telehealth: Payer: Self-pay | Admitting: Orthopedic Surgery

## 2020-09-16 ENCOUNTER — Ambulatory Visit: Payer: BC Managed Care – PPO | Admitting: Orthopedic Surgery

## 2020-09-16 NOTE — Telephone Encounter (Signed)
Patient did not attend appointment following emergency room, scheduled per Dr Aline Brochure for 09/15/20. Patient had not initially responded to calls (voice messages left).  Patient returned call just prior to lunchtime - stated she had just woke up. Dr Ruthe Mannan clinic had already completed for the day by that time; patient rescheduled to today, 09/16/20, 2:30pm  Patient did not attend the appointment.  Please advise regarding letter.

## 2020-09-23 ENCOUNTER — Encounter: Payer: Self-pay | Admitting: Orthopedic Surgery

## 2020-09-23 ENCOUNTER — Other Ambulatory Visit: Payer: Self-pay

## 2020-09-23 ENCOUNTER — Ambulatory Visit (INDEPENDENT_AMBULATORY_CARE_PROVIDER_SITE_OTHER): Payer: BC Managed Care – PPO | Admitting: Orthopedic Surgery

## 2020-09-23 VITALS — BP 175/109 | HR 112 | Ht 67.0 in | Wt 140.0 lb

## 2020-09-23 DIAGNOSIS — M84352A Stress fracture, left femur, initial encounter for fracture: Secondary | ICD-10-CM | POA: Diagnosis not present

## 2020-09-23 NOTE — Patient Instructions (Signed)
OOW X 8 WEEKS   ALL WEIGHT BEARING HAS TO BE WITH THE WALKER   LAB WORK WILL BE DONE AND RESULTS CALLED

## 2020-09-23 NOTE — Progress Notes (Signed)
New patient  Chief Complaint  Patient presents with  . Hip Pain    left     54 year old female recently went to the ER 2 occasions.  She says her leg gave out she fell onto her left hip she went to the ER x-rays were taken they were negative she went back to the ER for second visit had an MRI which showed a stress reaction in the proximal femur involving the neck femoral head.  It was read as a stress fracture but looks more like a stress reaction  She does not use any assistive device at this time although she is encouraged to do so  Review of systems is negative she denies any metabolic bone diseases or history of in her family she did have a hysterectomy and bilateral salpingectomy unclear if oophorectomy was done looks like it was not  Past Medical History:  Diagnosis Date  . Abnormal Pap smear   . Anemia   . Anxiety   . BV (bacterial vaginosis) 05/16/2013  . Fibroids    uterine  . Hypertension   . Mental disorder    depression and panic attacks  . Tachycardia    Past Surgical History:  Procedure Laterality Date  . ABDOMINAL HYSTERECTOMY    . BILATERAL SALPINGECTOMY Bilateral 02/25/2014   Procedure: BILATERAL SALPINGECTOMY;  Surgeon: Florian Buff, MD;  Location: [facility];  Service: Gynecology;  Laterality: Bilateral;  . DILATION AND CURETTAGE OF UTERUS    . SUPRACERVICAL ABDOMINAL HYSTERECTOMY N/A 02/25/2014   Procedure: HYSTERECTOMY SUPRACERVICAL ABDOMINAL;  Surgeon: Florian Buff, MD;  Location: [facility];  Service: Gynecology;  Laterality: N/A;  . TUBAL LIGATION     Social History   Tobacco Use  . Smoking status: Never Smoker  . Smokeless tobacco: Never Used  Vaping Use  . Vaping Use: Never used  Substance Use Topics  . Alcohol use: Yes    Comment: 1 beer daily   . Drug use: No    Family History  Problem Relation Age of Onset  . Congestive Heart Failure Mother   . Congestive Heart Failure Maternal Grandmother   . Cancer Paternal Grandmother        ovarian    BP  (!) 175/109   Pulse (!) 112   Ht 5\' 7"  (1.702 m)   Wt 140 lb (63.5 kg)   LMP 02/09/2014   BMI 21.93 kg/m   She is awake and alert pleasant oriented small body habitus  Gait is noted for tiptoe gait minimal weightbearing on the left foot  Right hip exam was normal  Left hip exam she had no real pain with flexion extension of the hip and there is no shortening or external rotation.  2 sets of images.  There is some sclerosis in the femoral neck on plain film  On MRI we find stress reaction proximal femur no evidence of avascular necrosis  I recommend she be out of work for 8 weeks that we take an x-ray at 3 and 6 weeks if her pain goes away with protected weightbearing with a walker she can return to work  She also needs a work-up for osteoporosis and vitamin D levels and calcium levels need to be obtained as well as kidney function  Bone density needs to be ordered as well  Follow-up in 3 weeks for x-ray  I will call the lab results to the patient upon completion

## 2020-09-28 LAB — CBC WITH DIFFERENTIAL/PLATELET
Absolute Monocytes: 454 cells/uL (ref 200–950)
Basophils Absolute: 50 cells/uL (ref 0–200)
Basophils Relative: 0.7 %
Eosinophils Absolute: 92 cells/uL (ref 15–500)
Eosinophils Relative: 1.3 %
HCT: 46.2 % — ABNORMAL HIGH (ref 35.0–45.0)
Hemoglobin: 15.3 g/dL (ref 11.7–15.5)
Lymphs Abs: 1839 cells/uL (ref 850–3900)
MCH: 28.1 pg (ref 27.0–33.0)
MCHC: 33.1 g/dL (ref 32.0–36.0)
MCV: 84.9 fL (ref 80.0–100.0)
MPV: 11.2 fL (ref 7.5–12.5)
Monocytes Relative: 6.4 %
Neutro Abs: 4665 cells/uL (ref 1500–7800)
Neutrophils Relative %: 65.7 %
Platelets: 275 10*3/uL (ref 140–400)
RBC: 5.44 10*6/uL — ABNORMAL HIGH (ref 3.80–5.10)
RDW: 14.4 % (ref 11.0–15.0)
Total Lymphocyte: 25.9 %
WBC: 7.1 10*3/uL (ref 3.8–10.8)

## 2020-09-28 LAB — COMPLETE METABOLIC PANEL WITH GFR
AG Ratio: 1.6 (calc) (ref 1.0–2.5)
ALT: 11 U/L (ref 6–29)
AST: 13 U/L (ref 10–35)
Albumin: 4.7 g/dL (ref 3.6–5.1)
Alkaline phosphatase (APISO): 95 U/L (ref 37–153)
BUN: 14 mg/dL (ref 7–25)
CO2: 25 mmol/L (ref 20–32)
Calcium: 10.6 mg/dL — ABNORMAL HIGH (ref 8.6–10.4)
Chloride: 105 mmol/L (ref 98–110)
Creat: 0.99 mg/dL (ref 0.50–1.05)
GFR, Est African American: 74 mL/min/{1.73_m2} (ref >=60)
GFR, Est Non African American: 64 mL/min/{1.73_m2} (ref >=60)
Globulin: 3 g/dL (calc) (ref 1.9–3.7)
Glucose, Bld: 99 mg/dL (ref 65–99)
Potassium: 4.4 mmol/L (ref 3.5–5.3)
Sodium: 139 mmol/L (ref 135–146)
Total Bilirubin: 0.6 mg/dL (ref 0.2–1.2)
Total Protein: 7.7 g/dL (ref 6.1–8.1)

## 2020-09-28 LAB — VITAMIN D 1,25 DIHYDROXY
Vitamin D 1, 25 (OH)2 Total: 62 pg/mL (ref 18–72)
Vitamin D2 1, 25 (OH)2: 28 pg/mL
Vitamin D3 1, 25 (OH)2: 34 pg/mL

## 2020-10-11 ENCOUNTER — Telehealth: Payer: Self-pay | Admitting: Radiology

## 2020-10-11 NOTE — Telephone Encounter (Signed)
DEXA scheduled for Oct 29th /330 pm  No calcium supplements for 48 hours prior to study  Phone disconnected   Need to see if this has to be precerted

## 2020-10-11 NOTE — Telephone Encounter (Signed)
Needs DEXA

## 2020-10-11 NOTE — Telephone Encounter (Signed)
Mailed letter to her about appointment for the DEXA

## 2020-10-14 ENCOUNTER — Other Ambulatory Visit: Payer: Self-pay

## 2020-10-14 ENCOUNTER — Ambulatory Visit (INDEPENDENT_AMBULATORY_CARE_PROVIDER_SITE_OTHER): Payer: BC Managed Care – PPO | Admitting: Orthopedic Surgery

## 2020-10-14 ENCOUNTER — Encounter: Payer: Self-pay | Admitting: Orthopedic Surgery

## 2020-10-14 ENCOUNTER — Ambulatory Visit: Payer: BC Managed Care – PPO

## 2020-10-14 VITALS — BP 163/90 | HR 77

## 2020-10-14 DIAGNOSIS — M84352A Stress fracture, left femur, initial encounter for fracture: Secondary | ICD-10-CM

## 2020-10-14 NOTE — Progress Notes (Signed)
Chief Complaint  Patient presents with  . Hip Pain    left hip fracture,     Encounter Diagnosis  Name Primary?  . Stress fracture of left hip Yes    54 year old female with a stress fracture in her left hip may be a stress reaction as the edema was diffuse with no specific linear fracture line  Patient's lab studies were normal except for elevated calcium  She has been protected weightbearing since her last visit on September 30  X-ray today.  The hip is 85% better   xrays no frx   rec cane x 3 weeks   Encounter Diagnosis  Name Primary?  . Stress fracture of left hip Yes    (AC COMPLICATED, LOW RISK)

## 2020-10-14 NOTE — Patient Instructions (Signed)
Cane for 3 weeks   xrays in 3 weeks

## 2020-10-22 ENCOUNTER — Ambulatory Visit (HOSPITAL_COMMUNITY)
Admission: RE | Admit: 2020-10-22 | Discharge: 2020-10-22 | Disposition: A | Discharge status: Home / Self Care | Payer: BC Managed Care – PPO | Source: Ambulatory Visit | Attending: Orthopedic Surgery | Admitting: Orthopedic Surgery

## 2020-10-22 ENCOUNTER — Other Ambulatory Visit: Payer: Self-pay

## 2020-10-22 DIAGNOSIS — M84352A Stress fracture, left femur, initial encounter for fracture: Secondary | ICD-10-CM | POA: Insufficient documentation

## 2020-11-04 ENCOUNTER — Other Ambulatory Visit: Payer: Self-pay

## 2020-11-04 ENCOUNTER — Ambulatory Visit (INDEPENDENT_AMBULATORY_CARE_PROVIDER_SITE_OTHER): Payer: BC Managed Care – PPO | Admitting: Orthopedic Surgery

## 2020-11-04 ENCOUNTER — Ambulatory Visit: Payer: BC Managed Care – PPO

## 2020-11-04 DIAGNOSIS — M85859 Other specified disorders of bone density and structure, unspecified thigh: Secondary | ICD-10-CM | POA: Diagnosis not present

## 2020-11-04 DIAGNOSIS — M84352A Stress fracture, left femur, initial encounter for fracture: Secondary | ICD-10-CM

## 2020-11-04 NOTE — Progress Notes (Signed)
Chief complaint left hip pain   Encounter Diagnoses  Name Primary?  . Stress fracture of left hip Yes  . Osteopenia of hip, unspecified laterality     Vicki [name] has osteopenia she is on calcium and vitamin D we diagnosed her with a stress fracture or stress reaction left hip x-ray today shows no evidence of progression of the fracture increased bony sclerosis in the hip area and her pain has improved with the use of a cane  Recommend she continue the cane for 2 weeks and try to walk without it for 1 week and see me on October 1 with a plan to see when we can get her back to work

## 2020-11-25 ENCOUNTER — Encounter: Payer: Self-pay | Admitting: Orthopedic Surgery

## 2020-11-25 ENCOUNTER — Ambulatory Visit (INDEPENDENT_AMBULATORY_CARE_PROVIDER_SITE_OTHER): Payer: Self-pay | Admitting: Orthopedic Surgery

## 2020-11-25 ENCOUNTER — Other Ambulatory Visit: Payer: Self-pay

## 2020-11-25 VITALS — BP 180/119 | HR 126 | Ht 67.0 in | Wt 140.0 lb

## 2020-11-25 DIAGNOSIS — M85859 Other specified disorders of bone density and structure, unspecified thigh: Secondary | ICD-10-CM

## 2020-11-25 DIAGNOSIS — M84352A Stress fracture, left femur, initial encounter for fracture: Secondary | ICD-10-CM

## 2020-11-25 NOTE — Patient Instructions (Signed)
Take the vit D   If the pain increases again call me back   Ok to return to work

## 2020-11-25 NOTE — Progress Notes (Signed)
Chief Complaint  Patient presents with  . Hip Pain    left/ getting better still has pain at night     Encounter Diagnoses  Name Primary?  . Stress fracture of left hip Yes  . Osteopenia of hip, unspecified laterality        Vicki [name] has osteopenia she is on calcium and vitamin D we diagnosed her with a stress fracture or stress reaction left hip x-ray late September 2021 shes been protected weight bearing trying now to transition away from the cane   She has improved   shes walking well   Hip ROM is normal with out pain   Ok to return to work   Fu if pain increases and take the vit D

## 2021-01-13 ENCOUNTER — Encounter (HOSPITAL_COMMUNITY): Payer: Self-pay | Admitting: Emergency Medicine

## 2021-01-13 ENCOUNTER — Emergency Department (HOSPITAL_COMMUNITY)
Admission: EM | Admit: 2021-01-13 | Discharge: 2021-01-13 | Disposition: A | Discharge status: Home / Self Care | Payer: PRIVATE HEALTH INSURANCE | Attending: Emergency Medicine | Admitting: Emergency Medicine

## 2021-01-13 ENCOUNTER — Emergency Department (HOSPITAL_COMMUNITY): Payer: PRIVATE HEALTH INSURANCE

## 2021-01-13 ENCOUNTER — Other Ambulatory Visit: Payer: Self-pay

## 2021-01-13 DIAGNOSIS — W010XXA Fall on same level from slipping, tripping and stumbling without subsequent striking against object, initial encounter: Secondary | ICD-10-CM | POA: Insufficient documentation

## 2021-01-13 DIAGNOSIS — I10 Essential (primary) hypertension: Secondary | ICD-10-CM | POA: Insufficient documentation

## 2021-01-13 DIAGNOSIS — Y9289 Other specified places as the place of occurrence of the external cause: Secondary | ICD-10-CM | POA: Insufficient documentation

## 2021-01-13 DIAGNOSIS — M25552 Pain in left hip: Secondary | ICD-10-CM

## 2021-01-13 DIAGNOSIS — Z79899 Other long term (current) drug therapy: Secondary | ICD-10-CM | POA: Insufficient documentation

## 2021-01-13 DIAGNOSIS — W19XXXA Unspecified fall, initial encounter: Secondary | ICD-10-CM

## 2021-01-13 MED ORDER — HYDROCODONE-ACETAMINOPHEN 5-325 MG PO TABS: 1.0000 | ORAL_TABLET | ORAL | 0 refills | Status: DC | PRN

## 2021-01-13 NOTE — ED Provider Notes (Signed)
[facility] EMERGENCY DEPARTMENT Provider Note   CSN: [id number] Arrival date & time: 01/13/21  1847     History Chief Complaint  Patient presents with  . Fall    Vicki [name] is a 55 y.o. female.  The history is provided by the patient. No language interpreter was used.  Fall This is a new problem. The current episode started 2 days ago. The problem occurs constantly. The problem has been gradually improving. Nothing aggravates the symptoms. Nothing relieves the symptoms. She has tried nothing for the symptoms.  Pt reports she slipped and fell 2 days ago. Pt reports she landed on her bottom.  Pt had a stress fracture to her left hip in September and is being followed by Dr. Aline Brochure.  Pt reports she pain is worse in left hip      Past Medical History:  Diagnosis Date  . Abnormal Pap smear   . Anemia   . Anxiety   . BV (bacterial vaginosis) 05/16/2013  . Fibroids    uterine  . Hypertension   . Mental disorder    depression and panic attacks  . Tachycardia     Patient Active Problem List   Diagnosis Date Noted  . S/P abdominal hysterectomy 02/25/2014  . Depression 07/25/2013  . Panic attacks 07/25/2013  . Fibroids 05/16/2013  . BV (bacterial vaginosis) 05/16/2013  . Anemia 04/03/2012  . Menorrhagia 04/03/2012  . Hypokalemia 04/03/2012  . UTI (lower urinary tract infection) 04/03/2012  . Emesis 04/03/2012    Past Surgical History:  Procedure Laterality Date  . ABDOMINAL HYSTERECTOMY    . BILATERAL SALPINGECTOMY Bilateral 02/25/2014   Procedure: BILATERAL SALPINGECTOMY;  Surgeon: Florian Buff, MD;  Location: [facility];  Service: Gynecology;  Laterality: Bilateral;  . DILATION AND CURETTAGE OF UTERUS    . SUPRACERVICAL ABDOMINAL HYSTERECTOMY N/A 02/25/2014   Procedure: HYSTERECTOMY SUPRACERVICAL ABDOMINAL;  Surgeon: Florian Buff, MD;  Location: [facility];  Service: Gynecology;  Laterality: N/A;  . TUBAL LIGATION       OB History    Gravida  7   Para  5   Term       Preterm      AB  2   Living  5     SAB  2   IAB      Ectopic      Multiple      Live Births              Family History  Problem Relation Age of Onset  . Congestive Heart Failure Mother   . Congestive Heart Failure Maternal Grandmother   . Cancer Paternal Grandmother        ovarian    Social History   Tobacco Use  . Smoking status: Never Smoker  . Smokeless tobacco: Never Used  Vaping Use  . Vaping Use: Never used  Substance Use Topics  . Alcohol use: Yes    Comment: 1 beer daily   . Drug use: No    Home Medications Prior to Admission medications   Medication Sig Start Date End Date Taking? Authorizing Provider  HYDROcodone-acetaminophen (NORCO/VICODIN) 5-325 MG tablet Take 1 tablet by mouth every 4 (four) hours as needed for moderate pain. 01/13/21 01/13/22 Yes Caryl Ada K, PA-C  amLODipine (NORVASC) 5 MG tablet Take 1 tablet (5 mg total) by mouth daily. 11/08/19   Mesner, Corene Cornea, MD  carvedilol (COREG) 3.125 MG tablet Take 1 tablet (3.125 mg total) by mouth 2 (two) times  daily with a meal. 08/25/20   Idol, Almyra Free, PA-C  citalopram (CELEXA) 20 MG tablet Take 20 mg by mouth every evening.     [provider]  famotidine (PEPCID) 20 MG tablet Take 1 tablet (20 mg total) by mouth 2 (two) times daily. Patient taking differently: Take 20 mg by mouth daily as needed for heartburn or indigestion.  07/18/19   Milton Ferguson, MD  gabapentin (NEURONTIN) 100 MG capsule Take 100 mg by mouth 2 (two) times daily. 12/27/19   [provider]  hydrOXYzine (ATARAX/VISTARIL) 25 MG tablet Take 25 mg by mouth 3 (three) times daily as needed. for anxiety 08/20/18   [provider]  ibuprofen (ADVIL) 400 MG tablet Take 1 tablet (400 mg total) by mouth every 6 (six) hours as needed. 08/25/20   Evalee Jefferson, PA-C  meclizine (ANTIVERT) 25 MG tablet Take 1 tablet (25 mg total) by mouth 3 (three) times daily as needed for dizziness. 11/09/18   Nat Christen, MD   methocarbamol (ROBAXIN) 500 MG tablet Take 1 tablet (500 mg total) by mouth every 8 (eight) hours as needed for muscle spasms. 12/27/19   Davonna Belling, MD  predniSONE (DELTASONE) 20 MG tablet Take 2 tablets (40 mg total) by mouth daily. 12/27/19   Davonna Belling, MD  sucralfate (CARAFATE) 1 g tablet Take 1 tablet (1 g total) by mouth 4 (four) times daily as needed. 02/10/20   Maudie Flakes, MD  Vitamin D, Ergocalciferol, (DRISDOL) 1.25 MG (50000 UT) CAPS capsule TAKE 1 CAPSULE BY MOUTH ONCE A WEEK FOR LOW VITAMIN D 04/22/19   [provider]    Allergies    Patient has no known allergies.  Review of Systems   Review of Systems  All other systems reviewed and are negative.   Physical Exam Updated Vital Signs BP (!) 148/82   Pulse 65   Temp 97.8 F (36.6 C) (Oral)   Resp 18   Ht 5\' 7"  (1.702 m)   Wt 70.3 kg   LMP 02/09/2014   SpO2 98%   BMI 24.28 kg/m   Physical Exam Vitals and nursing note reviewed.  Constitutional:      Appearance: She is well-developed and well-nourished.  HENT:     Head: Normocephalic.  Eyes:     Extraocular Movements: EOM normal.  Cardiovascular:     Rate and Rhythm: Normal rate.  Pulmonary:     Effort: Pulmonary effort is normal.  Abdominal:     General: There is no distension.  Musculoskeletal:        General: Tenderness present. No deformity.     Cervical back: Normal range of motion.     Comments: Good range of motion left hip,    Skin:    General: Skin is warm.  Neurological:     Mental Status: She is alert and oriented to person, place, and time.  Psychiatric:        Mood and Affect: Mood and affect and mood normal.     ED Results / Procedures / Treatments   Labs (all labs ordered are listed, but only abnormal results are displayed) Labs Reviewed - No data to display  EKG None  Radiology DG Hip Unilat W or Wo Pelvis 2-3 Views Left  Result Date: 01/13/2021 CLINICAL DATA:  Fall, pain EXAM: DG HIP (WITH OR WITHOUT  PELVIS) 2-3V LEFT COMPARISON:  Multiple priors, most recent November 04, 2020 FINDINGS: There is no evidence of hip fracture or dislocation. Again noted is a  curvilinear area of sclerosis seen at the superior left femoral head likely stress reaction or avascular necrosis. No overlying femoral head collapse is seen. No overlying soft tissue swelling. IMPRESSION: No acute osseous abnormality Electronically Signed   By: Prudencio Pair M.D.   On: 01/13/2021 20:43    Procedures Procedures (including critical care time)  Medications Ordered in ED Medications - No data to display  ED Course  I have reviewed the triage vital signs and the nursing notes.  Pertinent labs & imaging results that were available during my care of the patient were reviewed by me and considered in my medical decision making (see chart for details).    MDM Rules/Calculators/A&P                          MDM:  No new fracture on xray.  Pt offered option of ct to asssess further.  Pt would perfer to wait and follow up with Dr. Aline Brochure.  Pt given rx for hydrocodne and note for work.  Final Clinical Impression(s) / ED Diagnoses Final diagnoses:  Fall, initial encounter  Hip pain, left    Rx / DC Orders ED Discharge Orders         Ordered    HYDROcodone-acetaminophen (NORCO/VICODIN) 5-325 MG tablet  Every 4 hours PRN        01/13/21 2100        An After Visit Summary was printed and given to the patient.    Sidney Ace 01/13/21 2253    Fredia Sorrow, MD 01/17/21 [phone]

## 2021-01-13 NOTE — Discharge Instructions (Signed)
Return if any problems.

## 2021-01-13 NOTE — ED Triage Notes (Addendum)
Pt c/o falling on ice Tuesday. Pt has previous stress fx to left hip and complaining of worsening pain to left hip. Pt ambulatory to triage with cane.

## 2021-02-01 ENCOUNTER — Other Ambulatory Visit: Payer: Self-pay

## 2021-02-01 DIAGNOSIS — Z20822 Contact with and (suspected) exposure to covid-19: Secondary | ICD-10-CM

## 2021-02-02 LAB — NOVEL CORONAVIRUS, NAA: SARS-CoV-2, NAA: DETECTED — AB

## 2021-02-02 LAB — SARS-COV-2, NAA 2 DAY TAT

## 2021-02-03 ENCOUNTER — Telehealth: Payer: Self-pay

## 2021-02-03 NOTE — Telephone Encounter (Signed)
Called to discuss with patient about COVID-19 symptoms and the use of one of the available treatments for those with mild to moderate Covid symptoms and at a high risk of hospitalization.  Pt appears to qualify for outpatient treatment due to co-morbid conditions and/or a member of an at-risk group in accordance with the FDA Emergency Use Authorization.    Symptom onset: Unknown Vaccinated: Unknown Booster? Unknown Immunocompromised? No Qualifiers: None per chart  Unable to reach pt - Left message and call back number.  Marcello Moores

## 2021-02-04 ENCOUNTER — Telehealth: Payer: Self-pay

## 2021-02-04 NOTE — Telephone Encounter (Signed)
Called to discuss with patient about COVID-19 symptoms and the use of one of the available treatments for those with mild to moderate Covid symptoms and at a high risk of hospitalization.  Pt appears to qualify for outpatient treatment due to co-morbid conditions and/or a member of an at-risk group in accordance with the FDA Emergency Use Authorization.    Symptom onset: Unknown Vaccinated: Unknown Booster? Unknown Immunocompromised? No Qualifiers: None  Attempted again to reach pt. Message left.   Vicki [name]

## 2021-03-10 ENCOUNTER — Other Ambulatory Visit: Payer: Self-pay

## 2021-03-10 ENCOUNTER — Emergency Department (HOSPITAL_COMMUNITY)
Admission: EM | Admit: 2021-03-10 | Discharge: 2021-03-10 | Disposition: A | Discharge status: Home / Self Care | Payer: BC Managed Care – PPO | Attending: Emergency Medicine | Admitting: Emergency Medicine

## 2021-03-10 ENCOUNTER — Encounter (HOSPITAL_COMMUNITY): Payer: Self-pay | Admitting: Emergency Medicine

## 2021-03-10 DIAGNOSIS — Z79899 Other long term (current) drug therapy: Secondary | ICD-10-CM | POA: Diagnosis not present

## 2021-03-10 DIAGNOSIS — I10 Essential (primary) hypertension: Secondary | ICD-10-CM | POA: Diagnosis not present

## 2021-03-10 DIAGNOSIS — R002 Palpitations: Secondary | ICD-10-CM | POA: Diagnosis present

## 2021-03-10 LAB — BASIC METABOLIC PANEL
Anion gap: 9 (ref 5–15)
BUN: 13 mg/dL (ref 6–20)
CO2: 23 mmol/L (ref 22–32)
Calcium: 9.5 mg/dL (ref 8.9–10.3)
Chloride: 107 mmol/L (ref 98–111)
Creatinine, Ser: 0.94 mg/dL (ref 0.44–1.00)
GFR, Estimated: >60 mL/min (ref >60)
Glucose, Bld: 96 mg/dL (ref 70–99)
Potassium: 3.5 mmol/L (ref 3.5–5.1)
Sodium: 139 mmol/L (ref 135–145)

## 2021-03-10 LAB — CBC
HCT: 39.3 % (ref 36.0–46.0)
Hemoglobin: 13.3 g/dL (ref 12.0–15.0)
MCH: 29.8 pg (ref 26.0–34.0)
MCHC: 33.8 g/dL (ref 30.0–36.0)
MCV: 87.9 fL (ref 80.0–100.0)
Platelets: 226 10*3/uL (ref 150–400)
RBC: 4.47 MIL/uL (ref 3.87–5.11)
RDW: 14.9 % (ref 11.5–15.5)
WBC: 6.7 10*3/uL (ref 4.0–10.5)
nRBC: 0 % (ref 0.0–0.2)

## 2021-03-10 MED ORDER — AMLODIPINE BESYLATE 5 MG PO TABS: 5.0000 mg | ORAL_TABLET | Freq: Every day | ORAL | 1 refills | Status: AC

## 2021-03-10 NOTE — ED Triage Notes (Signed)
Pt c/o of hypertension with cp at 1600 at work. 178/98 with EMS. Currently on BP meds.   Denies any cp, headache or vision changes at this time

## 2021-03-10 NOTE — ED Provider Notes (Signed)
[facility] EMERGENCY DEPARTMENT Provider Note   CSN: [id number] Arrival date & time: 03/10/21  1745     History Chief Complaint  Patient presents with  . Hypertension    Vicki [name] is a 55 y.o. female.  HPI   This patient is a 55 year old female with a history of hypertension, she is currently taking carvedilol, she takes Celexa and she takes gabapentin, she does not take any other daily medications.  She presents to the hospital with 2 complaints, she states that she was at work and a couple of hours ago she went to the nurses office because she was feeling vertiginous, she had palpitations, this lasted for a few seconds and then totally resolved.  When she got into the office the nurse checked her blood pressure and it was 200/100, she was sent to the emergency department.  She reports that she is totally asymptomatic at this time, reports that she was late taking her blood pressure medications this afternoon and actually took it at noon instead of at 7:00 when she woke up this morning.  She reports that she drinks lots of caffeine, she does not use any drugs or smoke cigarettes but does drink beer.  At this time she has no chest pain no palpitations no shortness of breath no swelling of the legs, no headaches, visual changes, difficulty with speech, numbness or weakness.  She is symptom-free.  Past Medical History:  Diagnosis Date  . Abnormal Pap smear   . Anemia   . Anxiety   . BV (bacterial vaginosis) 05/16/2013  . Fibroids    uterine  . Hypertension   . Mental disorder    depression and panic attacks  . Tachycardia     Patient Active Problem List   Diagnosis Date Noted  . S/P abdominal hysterectomy 02/25/2014  . Depression 07/25/2013  . Panic attacks 07/25/2013  . Fibroids 05/16/2013  . BV (bacterial vaginosis) 05/16/2013  . Anemia 04/03/2012  . Menorrhagia 04/03/2012  . Hypokalemia 04/03/2012  . UTI (lower urinary tract infection) 04/03/2012  . Emesis  04/03/2012    Past Surgical History:  Procedure Laterality Date  . ABDOMINAL HYSTERECTOMY    . BILATERAL SALPINGECTOMY Bilateral 02/25/2014   Procedure: BILATERAL SALPINGECTOMY;  Surgeon: Florian Buff, MD;  Location: [facility];  Service: Gynecology;  Laterality: Bilateral;  . DILATION AND CURETTAGE OF UTERUS    . SUPRACERVICAL ABDOMINAL HYSTERECTOMY N/A 02/25/2014   Procedure: HYSTERECTOMY SUPRACERVICAL ABDOMINAL;  Surgeon: Florian Buff, MD;  Location: [facility];  Service: Gynecology;  Laterality: N/A;  . TUBAL LIGATION       OB History    Gravida  7   Para  5   Term      Preterm      AB  2   Living  5     SAB  2   IAB      Ectopic      Multiple      Live Births              Family History  Problem Relation Age of Onset  . Congestive Heart Failure Mother   . Congestive Heart Failure Maternal Grandmother   . Cancer Paternal Grandmother        ovarian    Social History   Tobacco Use  . Smoking status: Never Smoker  . Smokeless tobacco: Never Used  Vaping Use  . Vaping Use: Never used  Substance Use Topics  . Alcohol use: Yes  Comment: 1 beer daily   . Drug use: No    Home Medications Prior to Admission medications   Medication Sig Start Date End Date Taking? Authorizing Provider  amLODipine (NORVASC) 5 MG tablet Take 1 tablet (5 mg total) by mouth daily. 11/08/19   Mesner, Corene Cornea, MD  carvedilol (COREG) 3.125 MG tablet Take 1 tablet (3.125 mg total) by mouth 2 (two) times daily with a meal. 08/25/20   Idol, Almyra Free, PA-C  citalopram (CELEXA) 20 MG tablet Take 20 mg by mouth every evening.     [provider]  famotidine (PEPCID) 20 MG tablet Take 1 tablet (20 mg total) by mouth 2 (two) times daily. Patient taking differently: Take 20 mg by mouth daily as needed for heartburn or indigestion.  07/18/19   Milton Ferguson, MD  gabapentin (NEURONTIN) 100 MG capsule Take 100 mg by mouth 2 (two) times daily. 12/27/19   [provider]   HYDROcodone-acetaminophen (NORCO/VICODIN) 5-325 MG tablet Take 1 tablet by mouth every 4 (four) hours as needed for moderate pain. 01/13/21 01/13/22  Fransico Meadow, PA-C  hydrOXYzine (ATARAX/VISTARIL) 25 MG tablet Take 25 mg by mouth 3 (three) times daily as needed. for anxiety 08/20/18   [provider]  ibuprofen (ADVIL) 400 MG tablet Take 1 tablet (400 mg total) by mouth every 6 (six) hours as needed. 08/25/20   Evalee Jefferson, PA-C  meclizine (ANTIVERT) 25 MG tablet Take 1 tablet (25 mg total) by mouth 3 (three) times daily as needed for dizziness. 11/09/18   Nat Christen, MD  methocarbamol (ROBAXIN) 500 MG tablet Take 1 tablet (500 mg total) by mouth every 8 (eight) hours as needed for muscle spasms. 12/27/19   Davonna Belling, MD  predniSONE (DELTASONE) 20 MG tablet Take 2 tablets (40 mg total) by mouth daily. 12/27/19   Davonna Belling, MD  sucralfate (CARAFATE) 1 g tablet Take 1 tablet (1 g total) by mouth 4 (four) times daily as needed. 02/10/20   Maudie Flakes, MD  Vitamin D, Ergocalciferol, (DRISDOL) 1.25 MG (50000 UT) CAPS capsule TAKE 1 CAPSULE BY MOUTH ONCE A WEEK FOR LOW VITAMIN D 04/22/19   [provider]    Allergies    Patient has no known allergies.  Review of Systems   Review of Systems  All other systems reviewed and are negative.   Physical Exam Updated Vital Signs BP (!) 184/90   Pulse 69   Temp 98.4 F (36.9 C)   Resp 17   Ht 1.702 m (5\' 7" )   Wt 70.3 kg   LMP 02/09/2014   SpO2 98%   BMI 24.28 kg/m   Physical Exam Vitals and nursing note reviewed.  Constitutional:      General: She is not in acute distress.    Appearance: She is well-developed.  HENT:     Head: Normocephalic and atraumatic.     Mouth/Throat:     Pharynx: No oropharyngeal exudate.  Eyes:     General: No scleral icterus.       Right eye: No discharge.        Left eye: No discharge.     Conjunctiva/sclera: Conjunctivae normal.     Pupils: Pupils are equal, round, and  reactive to light.  Neck:     Thyroid: No thyromegaly.     Vascular: No JVD.  Cardiovascular:     Rate and Rhythm: Normal rate and regular rhythm.     Heart sounds: Normal heart sounds. No murmur heard. No friction  rub. No gallop.   Pulmonary:     Effort: Pulmonary effort is normal. No respiratory distress.     Breath sounds: Normal breath sounds. No wheezing or rales.  Abdominal:     General: Bowel sounds are normal. There is no distension.     Palpations: Abdomen is soft. There is no mass.     Tenderness: There is no abdominal tenderness.  Musculoskeletal:        General: No tenderness. Normal range of motion.     Cervical back: Normal range of motion and neck supple.  Lymphadenopathy:     Cervical: No cervical adenopathy.  Skin:    General: Skin is warm and dry.     Findings: No erythema or rash.  Neurological:     Mental Status: She is alert.     Coordination: Coordination normal.     Comments: Speech is clear, cranial nerves III through XII are intact, memory is intact, strength is normal in all 4 extremities including grips, sensation is intact to light touch and pinprick in all 4 extremities. Coordination as tested by finger-nose-finger is normal, no limb ataxia. Normal gait, normal reflexes at the patellar tendons bilaterally  Psychiatric:        Behavior: Behavior normal.     ED Results / Procedures / Treatments   Labs (all labs ordered are listed, but only abnormal results are displayed) Labs Reviewed  BASIC METABOLIC PANEL  CBC    EKG EKG Interpretation  Date/Time:  Thursday March 10 2021 19:29:10 EDT Ventricular Rate:  69 PR Interval:    QRS Duration: 83 QT Interval:  427 QTC Calculation: 458 R Axis:   45 Text Interpretation: Sinus rhythm Normal ECG since last tracing no significant change Confirmed by Noemi Chapel [phone]) on 03/10/2021 7:35:24 PM   Radiology No results found.  Procedures Procedures   Medications Ordered in ED Medications - No  data to display  ED Course  I have reviewed the triage vital signs and the nursing notes.  Pertinent labs & imaging results that were available during my care of the patient were reviewed by me and considered in my medical decision making (see chart for details).    MDM Rules/Calculators/A&P                          This patient has no ectopy on the monitor, her EKG is unremarkable, and shows no signs of acute ischemia or ectopy, there is no signs of arrhythmia.  Her blood pressure is now 161/97, she is afebrile and has a normal heart rate.  She has no edema, no JVD, no symptoms, she has no tinnitus or hearing loss, she has no vertigo, she is neurologically normal.  The patient has been admonished to avoid caffeine and to keep her blood pressure regimen normal, she reports that she usually runs high, I can see from past records that she has been prescribed other medication specifically in relation to prior visits to the emergency department but states she no longer takes the amlodipine that was prescribed at one time.  I think she would benefit from a second medication as she is already on 1 and not having great control.  I will have this discussion with her.  Labs ordered  Labs  and EKG are all unremarkable.  The patient is stable for discharge  Doubt ischemia, doubt pulmonary embolism  Final Clinical Impression(s) / ED Diagnoses Final diagnoses:  Palpitations  Primary hypertension  Noemi Chapel, MD 03/10/21 [phone]

## 2021-03-10 NOTE — Discharge Instructions (Signed)
Your testing has all been normal, please stop drinking caffeine, make sure you get plenty of sleep and drink plenty of clear liquids (noncaffeinated).  If you should develop severe or worsening symptoms come back to the hospital, otherwise follow-up with your family doctor.  If you do not have a family doctor see the list below.  [facility] Primary Care Doctor List    Sinda Du MD. Specialty: Pulmonary Disease Contact information: [place]  [place] [address]  [phone]   Tula Nakayama, MD. Specialty: [facility] Medicine Contact information: [address], [address] [place] [address]  [phone]   Sallee Lange, MD. Specialty: [facility] Medicine Contact information: [place]  [place] [address]  [phone]   Rosita Fire, MD Specialty: Internal Medicine Contact information: [place] [address]  [phone]   Delphina Cahill, MD. Specialty: Internal Medicine Contact information: [place] [address]  [phone]    [facility] (Dr. Maudie Mercury) Specialty: Family Medicine Contact information: [place] [address]  [phone]   Leslie Andrea, MD. Specialty: [facility] Medicine Contact information: [place] [address]  [phone]   Asencion Noble, MD. Specialty: Internal Medicine Contact information: [address]  [place] [address]  [place]  [address] [place], [place] [address] [phone]  Services [facility] offers a variety of basic health services.

## 2021-04-02 ENCOUNTER — Emergency Department (HOSPITAL_COMMUNITY): Payer: BC Managed Care – PPO

## 2021-04-02 ENCOUNTER — Other Ambulatory Visit: Payer: Self-pay

## 2021-04-02 ENCOUNTER — Emergency Department (HOSPITAL_COMMUNITY)
Admission: EM | Admit: 2021-04-02 | Discharge: 2021-04-02 | Disposition: A | Discharge status: Home / Self Care | Payer: BC Managed Care – PPO | Attending: Emergency Medicine | Admitting: Emergency Medicine

## 2021-04-02 ENCOUNTER — Encounter (HOSPITAL_COMMUNITY): Payer: Self-pay | Admitting: Emergency Medicine

## 2021-04-02 DIAGNOSIS — M545 Low back pain, unspecified: Secondary | ICD-10-CM | POA: Diagnosis not present

## 2021-04-02 DIAGNOSIS — I10 Essential (primary) hypertension: Secondary | ICD-10-CM | POA: Diagnosis not present

## 2021-04-02 DIAGNOSIS — W010XXA Fall on same level from slipping, tripping and stumbling without subsequent striking against object, initial encounter: Secondary | ICD-10-CM | POA: Insufficient documentation

## 2021-04-02 DIAGNOSIS — Z79899 Other long term (current) drug therapy: Secondary | ICD-10-CM | POA: Insufficient documentation

## 2021-04-02 DIAGNOSIS — M87852 Other osteonecrosis, left femur: Secondary | ICD-10-CM | POA: Diagnosis not present

## 2021-04-02 DIAGNOSIS — M25552 Pain in left hip: Secondary | ICD-10-CM | POA: Diagnosis present

## 2021-04-02 DIAGNOSIS — M8700 Idiopathic aseptic necrosis of unspecified bone: Secondary | ICD-10-CM

## 2021-04-02 MED ORDER — NAPROXEN 500 MG PO TABS: 500.0000 mg | ORAL_TABLET | Freq: Two times a day (BID) | ORAL | 0 refills | Status: DC

## 2021-04-02 MED ORDER — LIDOCAINE 5 % EX PTCH: 1.0000 | MEDICATED_PATCH | CUTANEOUS | 0 refills | Status: DC

## 2021-04-02 MED ORDER — HYDROCODONE-ACETAMINOPHEN 5-325 MG PO TABS
1.0000 | ORAL_TABLET | Freq: Once | ORAL | Status: AC
  Administered 2021-04-02: 1 via ORAL
  Filled 2021-04-02: qty 1

## 2021-04-02 MED ORDER — OXYCODONE-ACETAMINOPHEN 5-325 MG PO TABS: 1.0000 | ORAL_TABLET | Freq: Four times a day (QID) | ORAL | 0 refills | Status: DC | PRN

## 2021-04-02 NOTE — ED Triage Notes (Signed)
Pt c/o  Left hip pain with a know stress fracture. Pt states she tripped over a shoe and seems to have made her pain worse.

## 2021-04-02 NOTE — ED Provider Notes (Signed)
[facility] EMERGENCY DEPARTMENT Provider Note   CSN: [id number] Arrival date & time: 04/02/21  1042     History Chief Complaint  Patient presents with  . Hip Pain    Vicki [name] is a 55 y.o. female with past medical history who presents for evaluation of left hip pain.  States she was diagnosed with a stress fracture last summer.  She is followed by Dr. Aline Brochure with Ortho.  Had a fall in February.  Has had intermittent pain to her hip since. 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  States yesterday she tripped over a shoe.  She feels like she has left hip, left lower back pain since.  States did not fall to the ground however "landed weird."  States she walks with a cane at home.  Patient does states she has chronic clicking to her left hip when she goes up and down stairs.  States she does do a lot of movement at work.  Pain goes from her left lower back to her left lateral hip and wraps around to the inner aspect of her thigh.  Pain worse with bending, twisting.  Pain does not extend into her distal foot.  No history of IV drug use, bowel or bladder incontinence, saddle paresthesia, malignancy.  She has not noted any swelling, skin changes or rashes to her extremities.  Took Tylenol and Motrin at home without relief.  Pain better she does not move and lays flat.  She rates her current pain a 10/10.  No history of PE, DVT, unilateral leg swelling, redness, warmth, recent surgery, immobilization or malignancy.  Denies additional aggravating or alleviating factors.  History pain from patient past medical records.  No interpreter used  HPI     Past Medical History:  Diagnosis Date  . Abnormal Pap smear   . Anemia   . Anxiety   . BV (bacterial vaginosis) 05/16/2013  . Fibroids    uterine  . Hypertension   . Mental disorder    depression and panic attacks  . Tachycardia     Patient Active Problem List   Diagnosis Date Noted  . S/P abdominal hysterectomy 02/25/2014  .  Depression 07/25/2013  . Panic attacks 07/25/2013  . Fibroids 05/16/2013  . BV (bacterial vaginosis) 05/16/2013  . Anemia 04/03/2012  . Menorrhagia 04/03/2012  . Hypokalemia 04/03/2012  . UTI (lower urinary tract infection) 04/03/2012  . Emesis 04/03/2012    Past Surgical History:  Procedure Laterality Date  . ABDOMINAL HYSTERECTOMY    . BILATERAL SALPINGECTOMY Bilateral 02/25/2014   Procedure: BILATERAL SALPINGECTOMY;  Surgeon: Florian Buff, MD;  Location: [facility];  Service: Gynecology;  Laterality: Bilateral;  . DILATION AND CURETTAGE OF UTERUS    . SUPRACERVICAL ABDOMINAL HYSTERECTOMY N/A 02/25/2014   Procedure: HYSTERECTOMY SUPRACERVICAL ABDOMINAL;  Surgeon: Florian Buff, MD;  Location: [facility];  Service: Gynecology;  Laterality: N/A;  . TUBAL LIGATION       OB History    Gravida  7   Para  5   Term      Preterm      AB  2   Living  5     SAB  2   IAB      Ectopic      Multiple      Live Births              Family History  Problem Relation Age of Onset  . Congestive Heart Failure Mother   .  Congestive Heart Failure Maternal Grandmother   . Cancer Paternal Grandmother        ovarian    Social History   Tobacco Use  . Smoking status: Never Smoker  . Smokeless tobacco: Never Used  Vaping Use  . Vaping Use: Never used  Substance Use Topics  . Alcohol use: Yes    Comment: 1 beer daily   . Drug use: No    Home Medications Prior to Admission medications   Medication Sig Start Date End Date Taking? Authorizing Provider  lidocaine (LIDODERM) 5 % Place 1 patch onto the skin daily. Remove & Discard patch within 12 hours or as directed by MD 04/02/21  Yes Kenniyah Sasaki A, PA-C  naproxen (NAPROSYN) 500 MG tablet Take 1 tablet (500 mg total) by mouth 2 (two) times daily. 04/02/21  Yes Anijah Spohr A, PA-C  oxyCODONE-acetaminophen (PERCOCET/ROXICET) 5-325 MG tablet Take 1 tablet by mouth every 6 (six) hours as needed for severe pain. 04/02/21  Yes  Myrical Andujo A, PA-C  amLODipine (NORVASC) 5 MG tablet Take 1 tablet (5 mg total) by mouth daily. 03/10/21   Noemi Chapel, MD  carvedilol (COREG) 3.125 MG tablet Take 1 tablet (3.125 mg total) by mouth 2 (two) times daily with a meal. 08/25/20   Idol, Almyra Free, PA-C  citalopram (CELEXA) 20 MG tablet Take 20 mg by mouth every evening.     [provider]  famotidine (PEPCID) 20 MG tablet Take 1 tablet (20 mg total) by mouth 2 (two) times daily. Patient taking differently: Take 20 mg by mouth daily as needed for heartburn or indigestion.  07/18/19   Milton Ferguson, MD  gabapentin (NEURONTIN) 100 MG capsule Take 100 mg by mouth 2 (two) times daily. 12/27/19   [provider]  HYDROcodone-acetaminophen (NORCO/VICODIN) 5-325 MG tablet Take 1 tablet by mouth every 4 (four) hours as needed for moderate pain. 01/13/21 01/13/22  Fransico Meadow, PA-C  hydrOXYzine (ATARAX/VISTARIL) 25 MG tablet Take 25 mg by mouth 3 (three) times daily as needed. for anxiety 08/20/18   [provider]  ibuprofen (ADVIL) 400 MG tablet Take 1 tablet (400 mg total) by mouth every 6 (six) hours as needed. 08/25/20   Evalee Jefferson, PA-C  meclizine (ANTIVERT) 25 MG tablet Take 1 tablet (25 mg total) by mouth 3 (three) times daily as needed for dizziness. 11/09/18   Nat Christen, MD  methocarbamol (ROBAXIN) 500 MG tablet Take 1 tablet (500 mg total) by mouth every 8 (eight) hours as needed for muscle spasms. 12/27/19   Davonna Belling, MD  predniSONE (DELTASONE) 20 MG tablet Take 2 tablets (40 mg total) by mouth daily. 12/27/19   Davonna Belling, MD  sucralfate (CARAFATE) 1 g tablet Take 1 tablet (1 g total) by mouth 4 (four) times daily as needed. 02/10/20   Maudie Flakes, MD  Vitamin D, Ergocalciferol, (DRISDOL) 1.25 MG (50000 UT) CAPS capsule TAKE 1 CAPSULE BY MOUTH ONCE A WEEK FOR LOW VITAMIN D 04/22/19   [provider]    Allergies    Patient has no known allergies.  Review of Systems   Review of  Systems  Constitutional: Negative.   HENT: Negative.   Respiratory: Negative.   Cardiovascular: Negative.   Gastrointestinal: Negative.   Genitourinary: Negative.   Musculoskeletal: Positive for back pain and gait problem (Cane at baseline, per patient). Negative for arthralgias, joint swelling, myalgias, neck pain and neck stiffness.       Left hip pain  Skin: Negative.  All other systems reviewed and are negative.   Physical Exam Updated Vital Signs BP (!) 155/101 (BP Location: Right Arm)   Pulse 80   Temp 98 F (36.7 C) (Oral)   Resp 18   Ht 5\' 7"  (1.702 m)   Wt 70.3 kg   LMP 02/09/2014   SpO2 100%   BMI 24.28 kg/m   Physical Exam Physical Exam  Constitutional: Pt appears well-developed and well-nourished. No distress.  HENT:  Head: Normocephalic and atraumatic.  Mouth/Throat: Oropharynx is clear and moist. No oropharyngeal exudate.  Eyes: Conjunctivae are normal.  Neck: Normal range of motion. Neck supple.  Full ROM without pain  Cardiovascular: Normal rate, regular rhythm and intact distal pulses.   Pulmonary/Chest: Effort normal and breath sounds normal. No respiratory distress. Pt has no wheezes.  Abdominal: Soft. Pt exhibits no distension. There is no tenderness, rebound or guarding. No abd bruit or pulsatile mass Musculoskeletal:  Full range of motion of the T-spine and L-spine with flexion, hyperextension, and lateral flexion. No midline tenderness or stepoffs. No tenderness to palpation of the spinous processes of the T-spine or L-spine. Mild tenderness to palpation of the paraspinous muscles of the LEFT L-spine. Positive straight leg raise on left at 45'. Tenderness over left piriformis, SI joint. Pelvis stable, nontender palpation Able to flex and extend at bilateral knees.  Full plantarflexion, dorsiflexion without any difficulty Compartments soft No bony tenderness to femur, tib-fib, thoracic or cervical spine Lymphadenopathy:    Pt has no cervical  adenopathy.  Neurological: Pt is alert. Pt has normal reflexes.  Speech is clear and goal oriented, follows commands Normal 5/5 strength in upper and lower extremities bilaterally including dorsiflexion and plantar flexion, strong and equal grip strength Sensation normal to light and sharp touch Moves extremities without ataxia, coordination intact Gait with cane. Walked from triage to room placement. Normal balance Skin: Skin is warm and dry. No rash noted or lesions noted. Pt is not diaphoretic.  No edema, erythema or warmth to lower extremities.  No erythema, ecchymosis,edema or warmth.  Psychiatric: Pt has a normal mood and affect. Behavior is normal.  Nursing note and vitals reviewed. ED Results / Procedures / Treatments   Labs (all labs ordered are listed, but only abnormal results are displayed) Labs Reviewed - No data to display  EKG None  Radiology DG Lumbar Spine Complete  Result Date: 04/02/2021 CLINICAL DATA:  Back and left hip pain after tripping. EXAM: LUMBAR SPINE - COMPLETE 4+ VIEW COMPARISON:  None. FINDINGS: There is no evidence of lumbar spine fracture. Alignment is normal. Intervertebral disc spaces are maintained. IMPRESSION: Negative. Electronically Signed   By: Fidela Salisbury M.D.   On: 04/02/2021 11:56   DG Hip Unilat W or Wo Pelvis 2-3 Views Left  Result Date: 04/02/2021 CLINICAL DATA:  Left hip pain after tripping. EXAM: DG HIP (WITH OR WITHOUT PELVIS) 2-3V LEFT COMPARISON:  MRI left hip 09/14/2020.  Hip x-rays 01/13/2021. FINDINGS: Frontal pelvis shows no fracture.  SI joints unremarkable. AP and frog-leg lateral views of the left hip show no evidence for femoral neck fracture. Progressive lucency in the femoral head with a rim of sclerosis is compatible with avascular necrosis. No evidence for stress fracture on the current study. IMPRESSION: As noted on x-ray of 01/13/2021, changes in the left femoral head are most suggestive of avascular necrosis. The edema  seen in the femoral neck on MRI of 09/14/2020 was likely related to the underlying AVN. No evidence for stress fracture  or complete fracture on today's x-ray exam. Electronically Signed   By: Misty Stanley M.D.   On: 04/02/2021 12:05    Procedures Procedures   Medications Ordered in ED Medications  HYDROcodone-acetaminophen (NORCO/VICODIN) 5-325 MG per tablet 1 tablet (1 tablet Oral Given 04/02/21 1137)    ED Course  I have reviewed the triage vital signs and the nursing notes.  Pertinent labs & imaging results that were available during my care of the patient were reviewed by me and considered in my medical decision making (see chart for details).  Patient here for evaluation of left hip, left lower back pain.  Has history of similar.  Followed by Dr. Aline Brochure with orthopedics.  Subsequently had a fall yesterday however did not fall to the ground was able to catch herself.  Patient states she "landed weird."  She walks with a cane at baseline.  Pelvis is stable, nontender palpation.  She is actually able to ambulate from triage back to her room.  Her heart and lungs are clear.  Abdomen soft, nontender.  She is neurovascularly intact.  She is able to flex and extend at her bilateral lower extremities without difficulty.  No shortening or rotation of legs.  She does have some left paraspinal lumbar tenderness as well as tenderness over left piriformis and SI region.  She has no unilateral leg swelling, redness or warmth.  She has tactile temperature to extremities.  No clinical evidence of DVT or critical leg ischemia on exam.  Will treat pain, get x-ray left hip, back and reassess.  Lumbar without evidence of acute fracture X-ray left hip does show likely avascular necrosis.  Discussed with the radiologist.  Discussed results with patient.  She will need to follow-up with Dr. Aline Brochure her orthopedist.  In meantime will prescribe pain management, lidocaine patches.  Reassuring no acute fracture,  dislocation or collapse on imaging.  No evidence of acute neurosurgical emergency.  Low sufficient for cauda equina, discitis, osteomyelitis, transverse myelitis, psoas abscess, VTE, bacterial infectious process  The patient has been appropriately medically screened and/or stabilized in the ED. I have low suspicion for any other emergent medical condition which would require further screening, evaluation or treatment in the ED or require inpatient management.  Patient is hemodynamically stable and in no acute distress.  Patient able to ambulate in department prior to ED.  Evaluation does not show acute pathology that would require ongoing or additional emergent interventions while in the emergency department or further inpatient treatment.  I have discussed the diagnosis with the patient and answered all questions.  Pain is been managed while in the emergency department and patient has no further complaints prior to discharge.  Patient is comfortable with plan discussed in room and is stable for discharge at this time.  I have discussed strict return precautions for returning to the emergency department.  Patient was encouraged to follow-up with PCP/specialist refer to at discharge.     MDM Rules/Calculators/A&P                           Final Clinical Impression(s) / ED Diagnoses Final diagnoses:  AVN (avascular necrosis of bone) ([place])    Rx / DC Orders ED Discharge Orders         Ordered    oxyCODONE-acetaminophen (PERCOCET/ROXICET) 5-325 MG tablet  Every 6 hours PRN        04/02/21 1248    lidocaine (LIDODERM) 5 %  Every 24  hours        04/02/21 1248    naproxen (NAPROSYN) 500 MG tablet  2 times daily        04/02/21 1248           Hilery Wintle A, PA-C 04/02/21 1251    Milton Ferguson, MD 04/03/21 [phone]

## 2021-04-02 NOTE — Discharge Instructions (Addendum)
It was a pleasure taking care of you here in the emergency department today  Your x-ray of your hip did show possible avascular necrosis as we discussed in the room.  I have written you for a short course of pain medicine.  Do not drive or operate heavy machinery while taking this medication.  Follow-up with Dr. Aline Brochure your orthopedist for reevaluation  Return to emergency department for any new or worsening symptoms

## 2021-04-14 ENCOUNTER — Ambulatory Visit: Payer: BC Managed Care – PPO | Admitting: Orthopaedic Surgery

## 2021-04-14 ENCOUNTER — Other Ambulatory Visit: Payer: Self-pay

## 2021-04-14 ENCOUNTER — Encounter: Payer: Self-pay | Admitting: Orthopaedic Surgery

## 2021-04-14 VITALS — BP 167/115 | HR 87 | Ht 67.0 in | Wt 153.0 lb

## 2021-04-14 DIAGNOSIS — M87052 Idiopathic aseptic necrosis of left femur: Secondary | ICD-10-CM | POA: Diagnosis not present

## 2021-04-14 NOTE — Progress Notes (Signed)
Patient Vicki [name], female DOB:March 28, 1966, 55 y.o. LKG:[id number]  Chief Complaint  Patient presents with  . Hip Pain    Left hip pain     HPI  Vicki [name] is a 55 y.o. female who has pain in the left hip.  She was seen last year by Dr. Aline Brochure and felt to have stress fracture of the left hip.  She did well until she tripped at home on 04-01-21 and hurt her left hip.  She went to the ER the next day.  X-rays were done and showed:  IMPRESSION: As noted on x-ray of 01/13/2021, changes in the left femoral head are most suggestive of avascular necrosis. The edema seen in the femoral neck on MRI of 09/14/2020 was likely related to the underlying AVN. No evidence for stress fracture or complete fracture on today's x-ray exam.  She was told of the findings by the ER and by me today.  I have explained what avascular necrosis is.  She has pain more with rainy weather.  She has much less pain today and would like to return to work.  I will have her return to work.  I will have her see Dr. Aline Brochure to further evaluate her hip problem.  She is agreeable to this.   Body mass index is 23.96 kg/m.  ROS  Review of Systems  Constitutional: Positive for activity change.  Musculoskeletal: Positive for arthralgias and gait problem.  All other systems reviewed and are negative.   All other systems reviewed and are negative.  The following is a summary of the past history medically, past history surgically, known current medicines, social history and family history.  This information is gathered electronically by the computer from prior information and documentation.  I review this each visit and have found including this information at this point in the chart is beneficial and informative.    Past Medical History:  Diagnosis Date  . Abnormal Pap smear   . Anemia   . Anxiety   . BV (bacterial vaginosis) 05/16/2013  . Fibroids    uterine  . Hypertension   . Mental disorder     depression and panic attacks  . Tachycardia     Past Surgical History:  Procedure Laterality Date  . ABDOMINAL HYSTERECTOMY    . BILATERAL SALPINGECTOMY Bilateral 02/25/2014   Procedure: BILATERAL SALPINGECTOMY;  Surgeon: Florian Buff, MD;  Location: [facility];  Service: Gynecology;  Laterality: Bilateral;  . DILATION AND CURETTAGE OF UTERUS    . SUPRACERVICAL ABDOMINAL HYSTERECTOMY N/A 02/25/2014   Procedure: HYSTERECTOMY SUPRACERVICAL ABDOMINAL;  Surgeon: Florian Buff, MD;  Location: [facility];  Service: Gynecology;  Laterality: N/A;  . TUBAL LIGATION      Family History  Problem Relation Age of Onset  . Congestive Heart Failure Mother   . Congestive Heart Failure Maternal Grandmother   . Cancer Paternal Grandmother        ovarian    Social History Social History   Tobacco Use  . Smoking status: Never Smoker  . Smokeless tobacco: Never Used  Vaping Use  . Vaping Use: Never used  Substance Use Topics  . Alcohol use: Yes    Comment: 1 beer daily   . Drug use: No    No Known Allergies  Current Outpatient Medications  Medication Sig Dispense Refill  . amLODipine (NORVASC) 5 MG tablet Take 1 tablet (5 mg total) by mouth daily. 30 tablet 1  . carvedilol (COREG) 3.125 MG tablet Take  1 tablet (3.125 mg total) by mouth 2 (two) times daily with a meal. 60 tablet 0  . citalopram (CELEXA) 20 MG tablet Take 20 mg by mouth every evening.     . famotidine (PEPCID) 20 MG tablet Take 1 tablet (20 mg total) by mouth 2 (two) times daily. (Patient taking differently: Take 20 mg by mouth daily as needed for heartburn or indigestion. ) 10 tablet 0  . gabapentin (NEURONTIN) 100 MG capsule Take 100 mg by mouth 2 (two) times daily.    Marland Kitchen HYDROcodone-acetaminophen (NORCO/VICODIN) 5-325 MG tablet Take 1 tablet by mouth every 4 (four) hours as needed for moderate pain. (Patient not taking: Reported on 04/14/2021) 20 tablet 0  . hydrOXYzine (ATARAX/VISTARIL) 25 MG tablet Take 25 mg by mouth 3 (three) times  daily as needed. for anxiety  6  . ibuprofen (ADVIL) 400 MG tablet Take 1 tablet (400 mg total) by mouth every 6 (six) hours as needed. 30 tablet 0  . lidocaine (LIDODERM) 5 % Place 1 patch onto the skin daily. Remove & Discard patch within 12 hours or as directed by MD 30 patch 0  . meclizine (ANTIVERT) 25 MG tablet Take 1 tablet (25 mg total) by mouth 3 (three) times daily as needed for dizziness. 15 tablet 0  . methocarbamol (ROBAXIN) 500 MG tablet Take 1 tablet (500 mg total) by mouth every 8 (eight) hours as needed for muscle spasms. 8 tablet 0  . naproxen (NAPROSYN) 500 MG tablet Take 1 tablet (500 mg total) by mouth 2 (two) times daily. 30 tablet 0  . oxyCODONE-acetaminophen (PERCOCET/ROXICET) 5-325 MG tablet Take 1 tablet by mouth every 6 (six) hours as needed for severe pain. 15 tablet 0  . predniSONE (DELTASONE) 20 MG tablet Take 2 tablets (40 mg total) by mouth daily. (Patient not taking: Reported on 04/14/2021) 6 tablet 0  . sucralfate (CARAFATE) 1 g tablet Take 1 tablet (1 g total) by mouth 4 (four) times daily as needed. 30 tablet 0  . Vitamin D, Ergocalciferol, (DRISDOL) 1.25 MG (50000 UT) CAPS capsule TAKE 1 CAPSULE BY MOUTH ONCE A WEEK FOR LOW VITAMIN D     No current facility-administered medications for this visit.     Physical Exam  Blood pressure (!) 167/115, pulse 87, height 5\' 7"  (1.702 m), weight 153 lb (69.4 kg), last menstrual period 02/09/2014.  Constitutional: overall normal hygiene, normal nutrition, well developed, normal grooming, normal body habitus. Assistive device:none  Musculoskeletal: gait and station Limp none, muscle tone and strength are normal, no tremors or atrophy is present.  .  Neurological: coordination overall normal.  Deep tendon reflex/nerve stretch intact.  Sensation normal.  Cranial nerves II-XII intact.   Skin:   Normal overall no scars, lesions, ulcers or rashes. No psoriasis.  Psychiatric: Alert and oriented x 3.  Recent memory intact,  remote memory unclear.  Normal mood and affect. Well groomed.  Good eye contact.  Cardiovascular: overall no swelling, no varicosities, no edema bilaterally, normal temperatures of the legs and arms, no clubbing, cyanosis and good capillary refill.  Lymphatic: palpation is normal.  Left hip is a little tender, has slightly decreased motion but very functional.  NV intact.  All other systems reviewed and are negative   The patient has been educated about the nature of the problem(s) and counseled on treatment options.  The patient appeared to understand what I have discussed and is in agreement with it.  Encounter Diagnosis  Name Primary?  . Avascular necrosis  of bone of left hip ([place]) Yes    PLAN Call if any problems.  Precautions discussed.  Continue current medications.   Return to clinic to see Dr. Aline Brochure.   Electronically Signed Sanjuana Kava, MD 4/21/20229:24 AM

## 2021-04-14 NOTE — Patient Instructions (Signed)
Return to work  

## 2021-04-19 ENCOUNTER — Ambulatory Visit: Payer: BC Managed Care – PPO | Admitting: Orthopaedic Surgery

## 2021-04-20 ENCOUNTER — Telehealth: Payer: Self-pay | Admitting: Orthopedic Surgery

## 2021-04-20 NOTE — Telephone Encounter (Signed)
Patient called and wants a call back to see if she is a candidate  for the stem cell in her hip.   Please call her back at [phone]

## 2021-04-20 NOTE — Telephone Encounter (Signed)
I could ask if she is  [employer] does not cover, she would have to pay $475 up front.   If she is still interested, I asked her to call me back. (left message)

## 2021-04-21 ENCOUNTER — Telehealth: Payer: Self-pay | Admitting: Orthopedic Surgery

## 2021-04-21 NOTE — Telephone Encounter (Signed)
Patient did call back; states she is looking into other options. I came back to see if clinic staff may be available, and patient hung up.

## 2021-04-21 NOTE — Telephone Encounter (Signed)
Patient called back again today.  Stating Amy Dr. Aline Brochure nurse has not called her back yet.  Will you please call this patient back at [phone]

## 2021-04-21 NOTE — Telephone Encounter (Signed)
Left another message, if she calls back have her check messages.

## 2021-05-03 DIAGNOSIS — Z91148 Patient's other noncompliance with medication regimen for other reason: Secondary | ICD-10-CM | POA: Insufficient documentation

## 2021-06-09 ENCOUNTER — Other Ambulatory Visit: Payer: Self-pay | Admitting: Radiology

## 2021-06-09 ENCOUNTER — Encounter: Payer: Self-pay | Admitting: Orthopedic Surgery

## 2021-06-09 ENCOUNTER — Other Ambulatory Visit: Payer: Self-pay | Admitting: Orthopedic Surgery

## 2021-06-09 ENCOUNTER — Ambulatory Visit: Payer: BC Managed Care – PPO | Admitting: Orthopedic Surgery

## 2021-06-09 ENCOUNTER — Other Ambulatory Visit: Payer: Self-pay

## 2021-06-09 VITALS — Ht 67.0 in | Wt 153.0 lb

## 2021-06-09 DIAGNOSIS — M87052 Idiopathic aseptic necrosis of left femur: Secondary | ICD-10-CM | POA: Diagnosis not present

## 2021-06-09 MED ORDER — DIAZEPAM 5 MG PO TABS: 5.0000 mg | ORAL_TABLET | Freq: Four times a day (QID) | ORAL | 0 refills | Status: DC | PRN

## 2021-06-09 NOTE — Progress Notes (Signed)
FOLLOW UP   Encounter Diagnosis  Name Primary?   Avascular necrosis of bone of left hip [facility]) Yes     Chief Complaint  Patient presents with   Follow-up    Recheck on left hip      55 year old female had a mysterious pain in her left hip which was thought to be stress reaction or stress fracture  We treated her with nonweightbearing and then progressive weightbearing protocol until she improved  She then presented back to the ER in April and then to our office with reaggravation of her hip which may have been brought on by a fall  She comes in today with left thigh and groin pain walking with a cane she could go to work on Tuesday but wishes to return  New x-rays suggest a vast necrosis of the entire superolateral half of the femoral head   Past Medical History:  Diagnosis Date   Abnormal Pap smear    Anemia    Anxiety    BV (bacterial vaginosis) 05/16/2013   Fibroids    uterine   Hypertension    Mental disorder    depression and panic attacks   Tachycardia    Physical Exam Constitutional:      General: She is not in acute distress.    Appearance: She is well-developed.     Comments: Well developed, well nourished Normal grooming and hygiene     Cardiovascular:     Comments: No peripheral edema Skin:    General: Skin is warm and dry.  Neurological:     Mental Status: She is alert and oriented to person, place, and time.     Sensory: No sensory deficit.     Coordination: Coordination normal.     Gait: Gait normal.     Deep Tendon Reflexes: Reflexes are normal and symmetric.  Psychiatric:        Mood and Affect: Mood normal.        Behavior: Behavior normal.        Thought Content: Thought content normal.        Judgment: Judgment normal.     Comments: Affect normal    Left hip flexion is good but the internal rotation causes pain  This is not present when internally rotating the right hip  Leg lengths are equal  She is ambulatory with a cane in the  right hand she has a noticeable limp  Outside images interpreted include unilateral hip and pelvis on April 02, 2020 the entire superolateral half of the femoral head shows sclerosis and cyst formation there is no evidence of subchondral fracture there is no degeneration of the joint right hip is normal.  Lumbar spine 5 views no fracture dislocation or is space narrowing is seen    Encounter Diagnosis  Name Primary?   Avascular necrosis of bone of left hip (HCC) Yes     Recommend MRI of the left hip to confirm the diagnosis of avascular necrosis  We have 2 treatment options if its avascular necrosis then total hip would be necessary if it stress fracture or stress reaction and of course pinning would be more appropriate treatment  Patient will follow-up after scanning

## 2021-06-09 NOTE — Patient Instructions (Addendum)
Mri left hip  While we are working on your approval for MRI please go ahead and call to schedule your appointment with [facility] within at least one (1) week.   Central Scheduling [phone]

## 2021-06-09 NOTE — Progress Notes (Signed)
Meds ordered this encounter  Medications   diazepam (VALIUM) 5 MG tablet    Sig: Take 1 tablet (5 mg total) by mouth every 6 (six) hours as needed for anxiety.    Dispense:  1 tablet    Refill:  0

## 2021-06-28 ENCOUNTER — Other Ambulatory Visit: Payer: Self-pay

## 2021-06-28 ENCOUNTER — Ambulatory Visit (HOSPITAL_COMMUNITY)
Admission: RE | Admit: 2021-06-28 | Discharge: 2021-06-28 | Disposition: A | Discharge status: Home / Self Care | Payer: BC Managed Care – PPO | Source: Ambulatory Visit | Attending: Orthopedic Surgery | Admitting: Orthopedic Surgery

## 2021-06-28 DIAGNOSIS — M87052 Idiopathic aseptic necrosis of left femur: Secondary | ICD-10-CM | POA: Diagnosis present

## 2021-06-30 ENCOUNTER — Ambulatory Visit: Payer: BC Managed Care – PPO | Admitting: Orthopedic Surgery

## 2021-07-21 ENCOUNTER — Encounter: Payer: Self-pay | Admitting: Orthopedic Surgery

## 2021-07-21 ENCOUNTER — Other Ambulatory Visit: Payer: Self-pay

## 2021-07-21 ENCOUNTER — Ambulatory Visit: Payer: BC Managed Care – PPO | Admitting: Orthopedic Surgery

## 2021-07-21 ENCOUNTER — Telehealth: Payer: Self-pay | Admitting: Orthopedic Surgery

## 2021-07-21 VITALS — BP 168/104 | HR 120 | Ht 67.0 in | Wt 150.0 lb

## 2021-07-21 DIAGNOSIS — M87052 Idiopathic aseptic necrosis of left femur: Secondary | ICD-10-CM

## 2021-07-21 MED ORDER — ACETAMINOPHEN-CODEINE #3 300-30 MG PO TABS: 1.0000 | ORAL_TABLET | Freq: Four times a day (QID) | ORAL | 0 refills | Status: DC | PRN

## 2021-07-21 NOTE — Progress Notes (Addendum)
Chief Complaint  Patient presents with   Hip Pain    Left   Results    Review MRI left hip    Encounter Diagnosis  Name Primary?   Avascular necrosis of bone of left hip ([address]) Yes    Ms. Crystal is here for review of her MRI she has chronic pain left hip originally treated with protected weightbearing got better but then pain worsened MRI now shows avascular necrosis  She had to stop working last Wednesday on 20 July secondary to pain and she fell a couple of times she is now using a cane  I read her MRI I agree with the findings of avascular necrosis left hip with some evidence of early signal changes in the femoral head on the right  Patient referred for direct anterior total hip on the left  Meds ordered this encounter  Medications   acetaminophen-codeine (TYLENOL #3) 300-30 MG tablet    Sig: Take 1 tablet by mouth every 6 (six) hours as needed for moderate pain.    Dispense:  30 tablet    Refill:  0

## 2021-07-21 NOTE — Telephone Encounter (Signed)
Patient states she forgot to ask Dr. Aline Brochure for pain medication while she was here.  Would he call her in something to [place]?  Thanks

## 2021-07-21 NOTE — Patient Instructions (Addendum)
OOW 20TH UNTIL 2 MONTHS   Driving Directions to [facility] from [facility] address is [place] The phone number is [phone]   Dr Jean Rosenthal  1. Start out going [place] on [address] toward [address].  Then 0.02 miles0.02 total miles 2. Take the 1st right onto [address]/[place]. Continue to follow [address].  If you reach [facility] you've gone a little too far  Then 0.58 miles0.60 total miles 3. Turn right onto [address] is just past [facility]  Then 2.25 miles2.85 total miles 4. Take the [address] ramp toward [place].  Then 0.25 miles3.10 total miles 5. Merge [address]  Then 18.17 miles21.28 total miles 6. Merge [address]  Then 1.47 miles22.74 total miles 7. Turn right onto [address] is just past [place]  Then 0.11 miles22.85 total miles  8. [address] [place], [place] [address], [place] is on the left.

## 2021-07-21 NOTE — Addendum Note (Signed)
Addended by: Carole Civil on: 07/21/2021 04:33 PM   Modules accepted: Orders

## 2021-07-26 NOTE — Telephone Encounter (Signed)
Called patient to notify of form received to relay forms protocol through [employer] - no voice mail set up, unable to leave msg.

## 2021-07-29 NOTE — Telephone Encounter (Signed)
Patient came in with [employer] form/fee; aware of [employer] forms process.

## 2021-08-09 ENCOUNTER — Encounter: Payer: Self-pay | Admitting: Orthopaedic Surgery

## 2021-08-09 ENCOUNTER — Other Ambulatory Visit: Payer: Self-pay

## 2021-08-09 ENCOUNTER — Ambulatory Visit (INDEPENDENT_AMBULATORY_CARE_PROVIDER_SITE_OTHER): Payer: BC Managed Care – PPO | Admitting: Orthopaedic Surgery

## 2021-08-09 DIAGNOSIS — M87052 Idiopathic aseptic necrosis of left femur: Secondary | ICD-10-CM

## 2021-08-09 DIAGNOSIS — M25552 Pain in left hip: Secondary | ICD-10-CM

## 2021-08-09 NOTE — Progress Notes (Signed)
Office Visit Note   Patient: Vicki [name]           Date of Birth: [birth year]           MRN: [id number] Visit Date: 08/09/2021              Requested by: Carole Civil, [place],  [place] [address] PCP: Jani Gravel, MD   Assessment & Plan: Visit Diagnoses:  1. Avascular necrosis of hip, left (HCC)   2. Pain in left hip     Plan: Based on her clinical exam and MRI findings she does need a hip replacement for her left hip.  This needs to be done certainly more acutely and she understands this as well given the nature of her pain and what worsening on clinical exam and radiographic studies.  I discussed in detail what hip replacement surgery involves.  I gave her handout about this as well.  I described the risks and benefits of surgery and what to expect with an intraoperative and postoperative course.  She will likely need to be out of work for least 6 to 8 weeks following surgery.  We will work on getting this scheduled soon.  All questions and concerns were answered and addressed.  Follow-Up Instructions: Return for 2 weeks post-op.   Orders:  No orders of the defined types were placed in this encounter.  No orders of the defined types were placed in this encounter.     Procedures: No procedures performed   Clinical Data: No additional findings.   Subjective: Chief Complaint  Patient presents with   Left Hip - Pain  The patient is referred from Dr. Arther Abbott with orthopedics in [place] to evaluate and treat left hip avascular necrosis.  This is been well-documented and seen on her MRI recently as well as clinical exam.  She does ambulate with a cane.  She has severe 10 out of 10 hip pain on the left hip.  This is rapidly getting worse over the last few weeks to few months.  Is been hurting for about a year.  This started off as a stress fracture.  She does report some alcohol use.  She is not a diabetic.  She does not drink on a daily  basis.  Her hip pain now is detriment affecting her mobility, her quality of life and actives daily living.  The MRI of her left hip does confirm severe avascular necrosis of her left hip with some femoral head collapse and effusion.  She actually still works and is on her feet daily.  HPI  Review of Systems There is currently listed no headache, chest pain, shortness of breath, fever, chills, nausea, vomiting  Objective: Vital Signs: LMP 02/09/2014   Physical Exam She is alert and orient x3 and in no acute distress Ortho Exam Examination of her left hip shows severe pain with internal and external rotation with stiffness with rotation as well. Specialty Comments:  No specialty comments available.  Imaging: No results found. The MRI of her left hip is reviewed with her and shows significant avascular necrosis of the femoral head with edema in the femoral head and neck and a large joint effusion.  There is subchondral collapse as well.  PMFS History: Patient Active Problem List   Diagnosis Date Noted   S/P abdominal hysterectomy 02/25/2014   Depression 07/25/2013   Panic attacks 07/25/2013   Fibroids 05/16/2013   BV (bacterial vaginosis) 05/16/2013  Anemia 04/03/2012   Menorrhagia 04/03/2012   Hypokalemia 04/03/2012   UTI (lower urinary tract infection) 04/03/2012   Emesis 04/03/2012   Past Medical History:  Diagnosis Date   Abnormal Pap smear    Anemia    Anxiety    BV (bacterial vaginosis) 05/16/2013   Fibroids    uterine   Hypertension    Mental disorder    depression and panic attacks   Tachycardia     Family History  Problem Relation Age of Onset   Congestive Heart Failure Mother    Congestive Heart Failure Maternal Grandmother    Cancer Paternal Grandmother        ovarian    Past Surgical History:  Procedure Laterality Date   ABDOMINAL HYSTERECTOMY     BILATERAL SALPINGECTOMY Bilateral 02/25/2014   Procedure: BILATERAL SALPINGECTOMY;  Surgeon: Florian Buff, MD;  Location: [facility];  Service: Gynecology;  Laterality: Bilateral;   DILATION AND CURETTAGE OF UTERUS     SUPRACERVICAL ABDOMINAL HYSTERECTOMY N/A 02/25/2014   Procedure: HYSTERECTOMY SUPRACERVICAL ABDOMINAL;  Surgeon: Florian Buff, MD;  Location: [facility];  Service: Gynecology;  Laterality: N/A;   TUBAL LIGATION     Social History   Occupational History   Not on file  Tobacco Use   Smoking status: Never   Smokeless tobacco: Never  Vaping Use   Vaping Use: Never used  Substance and Sexual Activity   Alcohol use: Yes    Comment: 1 beer daily    Drug use: No   Sexual activity: Not Currently    Birth control/protection: Surgical

## 2021-09-05 ENCOUNTER — Other Ambulatory Visit: Payer: Self-pay

## 2021-09-16 ENCOUNTER — Other Ambulatory Visit: Payer: Self-pay | Admitting: Physician Assistant

## 2021-09-22 ENCOUNTER — Other Ambulatory Visit: Payer: Self-pay | Admitting: Physician Assistant

## 2021-09-26 ENCOUNTER — Other Ambulatory Visit: Payer: Self-pay

## 2021-09-26 DIAGNOSIS — M87052 Idiopathic aseptic necrosis of left femur: Secondary | ICD-10-CM

## 2021-09-30 NOTE — Progress Notes (Addendum)
Surgical Instructions   Your procedure is scheduled on Thursday 10/06/2021.  Report to [facility] Main Entrance "A" at 05:30 A.M., then check in with the Admitting office.  Call [phone] if you have problems or questions between now and the morning of surgery:   Remember: Do not eat after midnight the night before your surgery  You may drink clear liquids until 04:30am the morning of your surgery.   Clear liquids allowed are: Water, Non-Citrus Juices (without pulp), Carbonated Beverages, Clear Tea, Black Coffee Only (NO MILK, CREAM, or POWDERED CREAMER of any kind), and Gatorade   Enhanced Recovery after Surgery for Orthopedics Enhanced Recovery after Surgery is a protocol used to improve the stress on your body and your recovery after surgery.  Patient Instructions  The day of surgery (if you do NOT have diabetes):  Drink ONE (1) Pre-Surgery Clear Ensure by 04:30 am the morning of surgery   This drink was given to you during your hospital  pre-op appointment visit. Nothing else to drink after completing the  Pre-Surgery Clear Ensure.        If you have questions, please contact your surgeon's office.    Take these medicines the morning of surgery with A SIP OF WATER:  Amlodipine (Norvasc) Carvedilol (Coreg) Gabapentin (Neurontin) Prednisone (Deltasone)  If needed you may take these medications the morning of surgery: Acetaminophen-codeine (Tylenol #3) Diazepam (Valium) Famotidine (Pepcid) Hydrocodone-acetaminophen (Norco/Vicodin) Hydroxyzine (Atarax/Vistaril) Meclizine (Antivert) Methocarbamol (Robaxin) Oxycodone-acetaminophen (Percocet/Roxicet)  As of today, STOP taking any Aspirin (unless otherwise instructed by your surgeon) or Aspirin-containing products; NSAIDS - Aleve, Naproxen, Ibuprofen, Motrin, Advil, Goody's, BC's, all herbal medications, fish oil, and all vitamins.   After your COVID test   You are not required to quarantine however you are required  to wear a well-fitting mask when you are out and around people not in your household.  If your mask becomes wet or soiled, replace with a new one.  Wash your hands often with soap and water for 20 seconds or clean your hands with an alcohol-based hand sanitizer that contains at least 60% alcohol.  Do not share personal items.  Notify your provider: if you are in close contact with someone who has COVID  or if you develop a fever of 100.4 or greater, sneezing, cough, sore throat, shortness of breath or body aches.          Do not wear jewelry or makeup  Do not wear lotions, powders, perfumes/colognes, or deodorant.  Do not shave 48 hours prior to surgery.  Men may shave face and neck.  Do not wear nail polish, gel polish, artificial nails, or any other type of covering on natural nails including fingernails and toenails. If patients have artificial nails, gel coating, etc. that need to be removed by a nail salon please have this removed prior to surgery or surgery may need to be canceled/delayed if the surgeon/ anesthesia feels like the patient is unable to be adequately monitored.  Do not bring valuables to the hospital - [facility] is not responsible for any belongings or valuables.             Do NOT Smoke (Tobacco/Vaping) or drink Alcohol 24 hours prior to your procedure  If you use a CPAP at night, you may bring all equipment for your overnight stay.   Contacts, glasses, hearing aids, dentures or partials may not be worn into surgery, please bring cases for these belongings   For patients admitted to the hospital,  discharge time will be determined by your treatment team.   Patients discharged the day of surgery will not be allowed to drive home, and someone needs to stay with them for 24 hours.  NO VISITORS WILL BE ALLOWED IN PRE-OP WHERE PATIENTS ARE PREPPED FOR SURGERY.  ONLY 1 SUPPORT PERSON MAY BE PRESENT IN THE WAITING ROOM WHILE YOU ARE IN SURGERY.  IF YOU ARE TO BE  ADMITTED, ONCE YOU ARE IN YOUR ROOM YOU WILL BE ALLOWED TWO (2) VISITORS. 1 (ONE) VISITOR MAY STAY OVERNIGHT BUT MUST ARRIVE TO THE ROOM BY 8pm.  Minor children may have two parents present. Special consideration for safety and communication needs will be reviewed on a case by case basis.  Special instructions:    Oral Hygiene is also important to reduce your risk of infection.  Remember - BRUSH YOUR TEETH THE MORNING OF SURGERY WITH YOUR REGULAR TOOTHPASTE   [facility]- Preparing For Surgery  Before surgery, you can play an important role. Because skin is not sterile, your skin needs to be as free of germs as possible. You can reduce the number of germs on your skin by washing with CHG (chlorahexidine gluconate) Soap before surgery.  CHG is an antiseptic cleaner which kills germs and bonds with the skin to continue killing germs even after washing.     Please do not use if you have an allergy to CHG or antibacterial soaps. If your skin becomes reddened/irritated stop using the CHG.  Do not shave (including legs and underarms) for at least 48 hours prior to first CHG shower. It is OK to shave your face.  Please follow these instructions carefully.     Shower the NIGHT BEFORE SURGERY and the MORNING OF SURGERY with CHG Soap.   If you chose to wash your hair, wash your hair first as usual with your normal shampoo. After you shampoo, rinse your hair and body thoroughly to remove the shampoo.    Then [employer] and genitals (private parts) with your normal soap and rinse thoroughly to remove soap.  Next use the CHG Soap as you would any other liquid soap. You can apply CHG directly to the skin and wash gently with a clean washcloth.   Apply the CHG Soap to your body ONLY FROM THE NECK DOWN.  Do not use on open wounds or open sores. Avoid contact with your eyes, ears, mouth and genitals (private parts). Wash Face and genitals (private parts)  with your normal soap.   Wash thoroughly, paying  special attention to the area where your surgery will be performed.  Thoroughly rinse your body with warm water from the neck down.  DO NOT shower/wash with your normal soap after using and rinsing off the CHG Soap.  Pat yourself dry with a CLEAN TOWEL.  Wear CLEAN PAJAMAS to bed the night before surgery  Place CLEAN SHEETS on your bed the night before your surgery  DO NOT SLEEP WITH PETS.   Day of Surgery:  Take a shower with CHG soap. Wear Clean/Comfortable clothing the morning of surgery Do not apply any deodorants/lotions.   Remember to brush your teeth WITH YOUR REGULAR TOOTHPASTE.   Please read over the following fact sheets that you were given.

## 2021-10-03 ENCOUNTER — Inpatient Hospital Stay (HOSPITAL_COMMUNITY)
Admission: RE | Admit: 2021-10-03 | Discharge: 2021-10-03 | Disposition: A | Discharge status: Home / Self Care | Payer: BC Managed Care – PPO | Source: Ambulatory Visit

## 2021-10-03 NOTE — Progress Notes (Signed)
Called patient at [phone] to find out if she was still coming to her pre op appt. No answer, l left a message for her to call us back at [phone]

## 2021-10-05 ENCOUNTER — Encounter (HOSPITAL_COMMUNITY)
Admission: RE | Admit: 2021-10-05 | Discharge: 2021-10-05 | Disposition: A | Discharge status: Home / Self Care | Payer: BC Managed Care – PPO | Source: Ambulatory Visit | Attending: Orthopaedic Surgery | Admitting: Orthopaedic Surgery

## 2021-10-05 ENCOUNTER — Encounter (HOSPITAL_COMMUNITY): Payer: Self-pay

## 2021-10-05 ENCOUNTER — Other Ambulatory Visit: Payer: Self-pay

## 2021-10-05 DIAGNOSIS — Z01812 Encounter for preprocedural laboratory examination: Secondary | ICD-10-CM | POA: Insufficient documentation

## 2021-10-05 DIAGNOSIS — Z20822 Contact with and (suspected) exposure to covid-19: Secondary | ICD-10-CM | POA: Diagnosis not present

## 2021-10-05 HISTORY — DX: Cardiac arrhythmia, unspecified: I49.9

## 2021-10-05 LAB — BASIC METABOLIC PANEL
Anion gap: 12 (ref 5–15)
BUN: 13 mg/dL (ref 6–20)
CO2: 21 mmol/L — ABNORMAL LOW (ref 22–32)
Calcium: 9.8 mg/dL (ref 8.9–10.3)
Chloride: 103 mmol/L (ref 98–111)
Creatinine, Ser: 0.96 mg/dL (ref 0.44–1.00)
GFR, Estimated: >60 mL/min (ref >60)
Glucose, Bld: 97 mg/dL (ref 70–99)
Potassium: 3.8 mmol/L (ref 3.5–5.1)
Sodium: 136 mmol/L (ref 135–145)

## 2021-10-05 LAB — SURGICAL PCR SCREEN
MRSA, PCR: NEGATIVE
Staphylococcus aureus: NEGATIVE

## 2021-10-05 LAB — CBC
HCT: 41.4 % (ref 36.0–46.0)
Hemoglobin: 13.7 g/dL (ref 12.0–15.0)
MCH: 28.2 pg (ref 26.0–34.0)
MCHC: 33.1 g/dL (ref 30.0–36.0)
MCV: 85.4 fL (ref 80.0–100.0)
Platelets: 274 10*3/uL (ref 150–400)
RBC: 4.85 MIL/uL (ref 3.87–5.11)
RDW: 14.5 % (ref 11.5–15.5)
WBC: 8.3 10*3/uL (ref 4.0–10.5)
nRBC: 0 % (ref 0.0–0.2)

## 2021-10-05 LAB — TYPE AND SCREEN
ABO/RH(D): O POS
Antibody Screen: NEGATIVE

## 2021-10-05 LAB — SARS CORONAVIRUS 2 (TAT 6-24 HRS): SARS Coronavirus 2: NEGATIVE

## 2021-10-05 NOTE — Progress Notes (Signed)
PCP - Dr. Jani Gravel Cardiologist - denies  PPM/ICD - denies   Chest x-ray - 02/10/20 EKG - 03/11/21 Stress Test - denies ECHO - denies Cardiac Cath - denies  Sleep Study - denies  DM- denies   Blood Thinner Instructions: n/a Aspirin Instructions: n/a  ERAS Protcol - Yes PRE-SURGERY Ensure given  COVID TEST- 10/05/21 at PAT appt, results pending   Anesthesia review: No  Patient denies shortness of breath, fever, cough and chest pain at PAT appointment   All instructions explained to the patient, with a verbal understanding of the material. Patient agrees to go over the instructions while at home for a better understanding. Patient also instructed to wear a mask in public after being tested for COVID-19. The opportunity to ask questions was provided.

## 2021-10-05 NOTE — Pre-Procedure Instructions (Signed)
Surgical Instructions     Your procedure is scheduled on Thursday 10/06/2021.   Report to [facility] Main Entrance "A" at 05:30 A.M., then check in with the Admitting office.   Call [phone] if you have problems or questions between now and the morning of surgery:              Remember: Do not eat after midnight the night before your surgery   You may drink clear liquids until 04:30am the morning of your surgery.   Clear liquids allowed are: Water, Non-Citrus Juices (without pulp), Carbonated Beverages, Clear Tea, Black Coffee Only (NO MILK, CREAM, or POWDERED CREAMER of any kind), and Gatorade     Enhanced Recovery after Surgery for Orthopedics Enhanced Recovery after Surgery is a protocol used to improve the stress on your body and your recovery after surgery.   Patient Instructions The day of surgery (if you do NOT have diabetes):  Drink ONE (1) Pre-Surgery Clear Ensure by 04:30 am the morning of surgery   This drink was given to you during your hospital  pre-op appointment visit. Nothing else to drink after completing the  Pre-Surgery Clear Ensure.        If you have questions, please contact your surgeon's office.                Take these medicines the morning of surgery with A SIP OF WATER:  Amlodipine (Norvasc) Carvedilol (Coreg) Prednisone (Deltasone)   If needed you may take these medications the morning of surgery: Meclizine (Antivert) Methocarbamol (Robaxin) Oxycodone-acetaminophen (Percocet/Roxicet)   As of today, STOP taking any Aspirin (unless otherwise instructed by your surgeon) or Aspirin-containing products; NSAIDS - Aleve, Naproxen, Ibuprofen, Motrin, Advil, Goody's, BC's, all herbal medications, fish oil, and all vitamins.     After your COVID test    You are not required to quarantine however you are required to wear a well-fitting mask when you are out and around people not in your household.  If your mask becomes wet or soiled, replace with a  new one.   Wash your hands often with soap and water for 20 seconds or clean your hands with an alcohol-based hand sanitizer that contains at least 60% alcohol.   Do not share personal items.   Notify your provider: if you are in close contact with someone who has COVID  or if you develop a fever of 100.4 or greater, sneezing, cough, sore throat, shortness of breath or body aches.          Do not wear jewelry or makeup   Do not wear lotions, powders, perfumes/colognes, or deodorant.   Do not shave 48 hours prior to surgery.  Men may shave face and neck.   Do not wear nail polish, gel polish, artificial nails, or any other type of covering on natural nails including fingernails and toenails. If patients have artificial nails, gel coating, etc. that need to be removed by a nail salon please have this removed prior to surgery or surgery may need to be canceled/delayed if the surgeon/ anesthesia feels like the patient is unable to be adequately monitored.   Do not bring valuables to the hospital - [facility] is not responsible for any belongings or valuables.             Do NOT Smoke (Tobacco/Vaping) or drink Alcohol 24 hours prior to your procedure   If you use a CPAP at night, you may bring all equipment for your overnight  stay.   Contacts, glasses, hearing aids, dentures or partials may not be worn into surgery, please bring cases for these belongings   For patients admitted to the hospital, discharge time will be determined by your treatment team.   Patients discharged the day of surgery will not be allowed to drive home, and someone needs to stay with them for 24 hours.   NO VISITORS WILL BE ALLOWED IN PRE-OP WHERE PATIENTS ARE PREPPED FOR SURGERY.  ONLY 1 SUPPORT PERSON MAY BE PRESENT IN THE WAITING ROOM WHILE YOU ARE IN SURGERY.  IF YOU ARE TO BE ADMITTED, ONCE YOU ARE IN YOUR ROOM YOU WILL BE ALLOWED TWO (2) VISITORS. 1 (ONE) VISITOR MAY STAY OVERNIGHT BUT MUST ARRIVE TO THE ROOM  BY 8pm.  Minor children may have two parents present. Special consideration for safety and communication needs will be reviewed on a case by case basis.   Special instructions:     Oral Hygiene is also important to reduce your risk of infection.  Remember - BRUSH YOUR TEETH THE MORNING OF SURGERY WITH YOUR REGULAR TOOTHPASTE     [facility]- Preparing For Surgery   Before surgery, you can play an important role. Because skin is not sterile, your skin needs to be as free of germs as possible. You can reduce the number of germs on your skin by washing with CHG (chlorahexidine gluconate) Soap before surgery.  CHG is an antiseptic cleaner which kills germs and bonds with the skin to continue killing germs even after washing.       Please do not use if you have an allergy to CHG or antibacterial soaps. If your skin becomes reddened/irritated stop using the CHG.  Do not shave (including legs and underarms) for at least 48 hours prior to first CHG shower. It is OK to shave your face.   Please follow these instructions carefully.                                                                                                                                  Shower the NIGHT BEFORE SURGERY and the MORNING OF SURGERY with CHG Soap.    If you chose to wash your hair, wash your hair first as usual with your normal shampoo. After you shampoo, rinse your hair and body thoroughly to remove the shampoo.     Then [employer] and genitals (private parts) with your normal soap and rinse thoroughly to remove soap.   Next use the CHG Soap as you would any other liquid soap. You can apply CHG directly to the skin and wash gently with a clean washcloth.    Apply the CHG Soap to your body ONLY FROM THE NECK DOWN.  Do not use on open wounds or open sores. Avoid contact with your eyes, ears, mouth and genitals (private parts). Wash Face and genitals (private parts)  with your normal soap.    Wash  thoroughly, paying  special attention to the area where your surgery will be performed.   Thoroughly rinse your body with warm water from the neck down.   DO NOT shower/wash with your normal soap after using and rinsing off the CHG Soap.   Pat yourself dry with a CLEAN TOWEL.   Wear CLEAN PAJAMAS to bed the night before surgery   Place CLEAN SHEETS on your bed the night before your surgery   DO NOT SLEEP WITH PETS.     Day of Surgery:   Take a shower with CHG soap. Wear Clean/Comfortable clothing the morning of surgery Do not apply any deodorants/lotions.   Remember to brush your teeth WITH YOUR REGULAR TOOTHPASTE.   Please read over the following fact sheets that you were given.

## 2021-10-06 ENCOUNTER — Encounter (HOSPITAL_COMMUNITY): Payer: Self-pay | Admitting: Orthopaedic Surgery

## 2021-10-06 ENCOUNTER — Observation Stay (HOSPITAL_COMMUNITY)
Admission: RE | Admit: 2021-10-06 | Discharge: 2021-10-07 | Disposition: A | Discharge status: Home / Self Care | Payer: BC Managed Care – PPO | Attending: Orthopaedic Surgery | Admitting: Orthopaedic Surgery

## 2021-10-06 ENCOUNTER — Ambulatory Visit (HOSPITAL_COMMUNITY): Payer: BC Managed Care – PPO | Admitting: Certified Registered Nurse Anesthetist

## 2021-10-06 ENCOUNTER — Other Ambulatory Visit: Payer: Self-pay

## 2021-10-06 ENCOUNTER — Ambulatory Visit (HOSPITAL_COMMUNITY): Payer: BC Managed Care – PPO

## 2021-10-06 ENCOUNTER — Encounter (HOSPITAL_COMMUNITY)
Admission: RE | Disposition: A | Discharge status: Home / Self Care | Payer: Self-pay | Source: Home / Self Care | Attending: Orthopaedic Surgery

## 2021-10-06 DIAGNOSIS — Z419 Encounter for procedure for purposes other than remedying health state, unspecified: Secondary | ICD-10-CM

## 2021-10-06 DIAGNOSIS — I10 Essential (primary) hypertension: Secondary | ICD-10-CM | POA: Diagnosis not present

## 2021-10-06 DIAGNOSIS — M87052 Idiopathic aseptic necrosis of left femur: Secondary | ICD-10-CM

## 2021-10-06 DIAGNOSIS — Z96642 Presence of left artificial hip joint: Secondary | ICD-10-CM

## 2021-10-06 HISTORY — PX: TOTAL HIP ARTHROPLASTY: SHX124

## 2021-10-06 SURGERY — ARTHROPLASTY, HIP, TOTAL, ANTERIOR APPROACH
Anesthesia: Monitor Anesthesia Care | Site: Hip | Laterality: Left

## 2021-10-06 MED ORDER — METHOCARBAMOL 500 MG PO TABS
500.0000 mg | ORAL_TABLET | Freq: Four times a day (QID) | ORAL | Status: DC | PRN
  Administered 2021-10-06 – 2021-10-07 (×4): 500 mg via ORAL
  Filled 2021-10-06 (×3): qty 1

## 2021-10-06 MED ORDER — ACETAMINOPHEN 325 MG PO TABS
325.0000 mg | ORAL_TABLET | Freq: Four times a day (QID) | ORAL | Status: DC | PRN
  Administered 2021-10-06: 650 mg via ORAL
  Filled 2021-10-06: qty 2

## 2021-10-06 MED ORDER — ACETAMINOPHEN 500 MG PO TABS: 1000.0000 mg | ORAL_TABLET | Freq: Once | ORAL | Status: DC | PRN

## 2021-10-06 MED ORDER — FENTANYL CITRATE (PF) 100 MCG/2ML IJ SOLN: 25.0000 ug | INTRAMUSCULAR | Status: DC | PRN

## 2021-10-06 MED ORDER — ALUM & MAG HYDROXIDE-SIMETH 200-200-20 MG/5ML PO SUSP: 30.0000 mL | ORAL | Status: DC | PRN

## 2021-10-06 MED ORDER — METHOCARBAMOL 500 MG PO TABS: 500.0000 mg | ORAL_TABLET | Freq: Four times a day (QID) | ORAL | 1 refills | Status: DC | PRN

## 2021-10-06 MED ORDER — ONDANSETRON HCL 4 MG PO TABS: 4.0000 mg | ORAL_TABLET | Freq: Four times a day (QID) | ORAL | Status: DC | PRN

## 2021-10-06 MED ORDER — FENTANYL CITRATE (PF) 100 MCG/2ML IJ SOLN
INTRAMUSCULAR | Status: DC | PRN
  Administered 2021-10-06: 50 ug via INTRAVENOUS

## 2021-10-06 MED ORDER — AMLODIPINE BESYLATE 5 MG PO TABS
5.0000 mg | ORAL_TABLET | Freq: Every day | ORAL | Status: DC
  Administered 2021-10-07: 5 mg via ORAL
  Filled 2021-10-06: qty 1

## 2021-10-06 MED ORDER — ASPIRIN 81 MG PO CHEW: 81.0000 mg | CHEWABLE_TABLET | Freq: Two times a day (BID) | ORAL | 0 refills | Status: AC

## 2021-10-06 MED ORDER — OXYCODONE HCL 5 MG/5ML PO SOLN: 5.0000 mg | Freq: Once | ORAL | Status: DC | PRN

## 2021-10-06 MED ORDER — METOCLOPRAMIDE HCL 5 MG/ML IJ SOLN: 5.0000 mg | Freq: Three times a day (TID) | INTRAMUSCULAR | Status: DC | PRN

## 2021-10-06 MED ORDER — LACTATED RINGERS IV SOLN: INTRAVENOUS | Status: DC

## 2021-10-06 MED ORDER — ONDANSETRON HCL 4 MG/2ML IJ SOLN
INTRAMUSCULAR | Status: AC
  Filled 2021-10-06: qty 2

## 2021-10-06 MED ORDER — CHLORHEXIDINE GLUCONATE 0.12 % MT SOLN
15.0000 mL | Freq: Once | OROMUCOSAL | Status: AC
  Administered 2021-10-06: 15 mL via OROMUCOSAL
  Filled 2021-10-06: qty 15

## 2021-10-06 MED ORDER — 0.9 % SODIUM CHLORIDE (POUR BTL) OPTIME
TOPICAL | Status: DC | PRN
  Administered 2021-10-06: 1000 mL

## 2021-10-06 MED ORDER — PHENOL 1.4 % MT LIQD: 1.0000 | OROMUCOSAL | Status: DC | PRN

## 2021-10-06 MED ORDER — PANTOPRAZOLE SODIUM 40 MG PO TBEC
40.0000 mg | DELAYED_RELEASE_TABLET | Freq: Every day | ORAL | Status: DC
Start: 2021-10-06 — End: 2021-10-07
  Administered 2021-10-06: 40 mg via ORAL
  Filled 2021-10-06: qty 1

## 2021-10-06 MED ORDER — LIDOCAINE 2% (20 MG/ML) 5 ML SYRINGE
INTRAMUSCULAR | Status: DC | PRN
  Administered 2021-10-06: 40 mg via INTRAVENOUS

## 2021-10-06 MED ORDER — PHENYLEPHRINE 40 MCG/ML (10ML) SYRINGE FOR IV PUSH (FOR BLOOD PRESSURE SUPPORT)
PREFILLED_SYRINGE | INTRAVENOUS | Status: AC
  Filled 2021-10-06: qty 10

## 2021-10-06 MED ORDER — VITAMIN D 25 MCG (1000 UNIT) PO TABS
1000.0000 [IU] | ORAL_TABLET | Freq: Every day | ORAL | Status: DC
  Administered 2021-10-07: 1000 [IU] via ORAL
  Filled 2021-10-06: qty 1

## 2021-10-06 MED ORDER — ACETAMINOPHEN 10 MG/ML IV SOLN
INTRAVENOUS | Status: AC
  Filled 2021-10-06: qty 100

## 2021-10-06 MED ORDER — FENTANYL CITRATE (PF) 250 MCG/5ML IJ SOLN
INTRAMUSCULAR | Status: AC
  Filled 2021-10-06: qty 5

## 2021-10-06 MED ORDER — TRANEXAMIC ACID-NACL 1000-0.7 MG/100ML-% IV SOLN
1000.0000 mg | INTRAVENOUS | Status: AC
  Administered 2021-10-06: 1000 mg via INTRAVENOUS
  Filled 2021-10-06: qty 100

## 2021-10-06 MED ORDER — ONDANSETRON HCL 4 MG/2ML IJ SOLN
INTRAMUSCULAR | Status: DC | PRN
  Administered 2021-10-06: 4 mg via INTRAVENOUS

## 2021-10-06 MED ORDER — ORAL CARE MOUTH RINSE: 15.0000 mL | Freq: Once | OROMUCOSAL | Status: AC

## 2021-10-06 MED ORDER — CEFAZOLIN SODIUM-DEXTROSE 2-4 GM/100ML-% IV SOLN
2.0000 g | INTRAVENOUS | Status: AC
  Administered 2021-10-06: 2 g via INTRAVENOUS
  Filled 2021-10-06: qty 100

## 2021-10-06 MED ORDER — PROPOFOL 10 MG/ML IV BOLUS
INTRAVENOUS | Status: DC | PRN
  Administered 2021-10-06 (×2): 50 mg via INTRAVENOUS

## 2021-10-06 MED ORDER — METOCLOPRAMIDE HCL 5 MG PO TABS
5.0000 mg | ORAL_TABLET | Freq: Three times a day (TID) | ORAL | Status: DC | PRN
Start: 2021-10-06 — End: 2021-10-07
  Administered 2021-10-07: 10 mg via ORAL
  Filled 2021-10-06: qty 2

## 2021-10-06 MED ORDER — CEFAZOLIN SODIUM-DEXTROSE 1-4 GM/50ML-% IV SOLN
1.0000 g | Freq: Four times a day (QID) | INTRAVENOUS | Status: AC
  Administered 2021-10-06 (×2): 1 g via INTRAVENOUS
  Filled 2021-10-06 (×2): qty 50

## 2021-10-06 MED ORDER — OXYCODONE HCL 5 MG PO TABS: 5.0000 mg | ORAL_TABLET | Freq: Four times a day (QID) | ORAL | 0 refills | Status: DC | PRN

## 2021-10-06 MED ORDER — SODIUM CHLORIDE 0.9 % IV SOLN: INTRAVENOUS | Status: DC

## 2021-10-06 MED ORDER — MENTHOL 3 MG MT LOZG: 1.0000 | LOZENGE | OROMUCOSAL | Status: DC | PRN

## 2021-10-06 MED ORDER — PHENYLEPHRINE 40 MCG/ML (10ML) SYRINGE FOR IV PUSH (FOR BLOOD PRESSURE SUPPORT)
PREFILLED_SYRINGE | INTRAVENOUS | Status: DC | PRN
  Administered 2021-10-06 (×2): 80 ug via INTRAVENOUS

## 2021-10-06 MED ORDER — ONDANSETRON HCL 4 MG/2ML IJ SOLN
4.0000 mg | Freq: Four times a day (QID) | INTRAMUSCULAR | Status: DC | PRN
  Administered 2021-10-06: 4 mg via INTRAVENOUS
  Filled 2021-10-06: qty 2

## 2021-10-06 MED ORDER — PROPOFOL 10 MG/ML IV BOLUS
INTRAVENOUS | Status: AC
  Filled 2021-10-06: qty 20

## 2021-10-06 MED ORDER — ASPIRIN 81 MG PO CHEW
81.0000 mg | CHEWABLE_TABLET | Freq: Two times a day (BID) | ORAL | Status: DC
  Administered 2021-10-06 – 2021-10-07 (×2): 81 mg via ORAL
  Filled 2021-10-06 (×2): qty 1

## 2021-10-06 MED ORDER — OXYCODONE HCL 5 MG PO TABS: 5.0000 mg | ORAL_TABLET | Freq: Once | ORAL | Status: DC | PRN

## 2021-10-06 MED ORDER — OXYCODONE HCL 5 MG PO TABS: 10.0000 mg | ORAL_TABLET | ORAL | Status: DC | PRN

## 2021-10-06 MED ORDER — CITALOPRAM HYDROBROMIDE 20 MG PO TABS
40.0000 mg | ORAL_TABLET | Freq: Every day | ORAL | Status: DC
  Administered 2021-10-06 – 2021-10-07 (×2): 40 mg via ORAL
  Filled 2021-10-06 (×2): qty 2

## 2021-10-06 MED ORDER — PROPOFOL 500 MG/50ML IV EMUL
INTRAVENOUS | Status: DC | PRN
  Administered 2021-10-06: 25 ug/kg/min via INTRAVENOUS

## 2021-10-06 MED ORDER — MIDAZOLAM HCL 2 MG/2ML IJ SOLN
INTRAMUSCULAR | Status: AC
  Filled 2021-10-06: qty 2

## 2021-10-06 MED ORDER — MIDAZOLAM HCL 5 MG/5ML IJ SOLN
INTRAMUSCULAR | Status: DC | PRN
  Administered 2021-10-06: 2 mg via INTRAVENOUS

## 2021-10-06 MED ORDER — POVIDONE-IODINE 10 % EX SWAB
2.0000 "application " | Freq: Once | CUTANEOUS | Status: AC
  Administered 2021-10-06: 2 via TOPICAL

## 2021-10-06 MED ORDER — OXYCODONE HCL 5 MG PO TABS
5.0000 mg | ORAL_TABLET | ORAL | Status: DC | PRN
  Administered 2021-10-06 – 2021-10-07 (×7): 10 mg via ORAL
  Filled 2021-10-06 (×7): qty 2

## 2021-10-06 MED ORDER — BUPIVACAINE IN DEXTROSE 0.75-8.25 % IT SOLN
INTRATHECAL | Status: DC | PRN
  Administered 2021-10-06: 1.8 mL via INTRATHECAL

## 2021-10-06 MED ORDER — ACETAMINOPHEN 10 MG/ML IV SOLN
1000.0000 mg | Freq: Once | INTRAVENOUS | Status: DC | PRN
  Administered 2021-10-06: 1000 mg via INTRAVENOUS

## 2021-10-06 MED ORDER — METHOCARBAMOL 1000 MG/10ML IJ SOLN
500.0000 mg | Freq: Four times a day (QID) | INTRAVENOUS | Status: DC | PRN
  Filled 2021-10-06: qty 5

## 2021-10-06 MED ORDER — DIPHENHYDRAMINE HCL 12.5 MG/5ML PO ELIX
12.5000 mg | ORAL_SOLUTION | ORAL | Status: DC | PRN
  Filled 2021-10-06: qty 10

## 2021-10-06 MED ORDER — CARVEDILOL 3.125 MG PO TABS
3.1250 mg | ORAL_TABLET | Freq: Two times a day (BID) | ORAL | Status: DC
  Administered 2021-10-06 – 2021-10-07 (×2): 3.125 mg via ORAL
  Filled 2021-10-06 (×2): qty 1

## 2021-10-06 MED ORDER — HYDROMORPHONE HCL 1 MG/ML IJ SOLN: 0.5000 mg | INTRAMUSCULAR | Status: DC | PRN

## 2021-10-06 MED ORDER — DOCUSATE SODIUM 100 MG PO CAPS
100.0000 mg | ORAL_CAPSULE | Freq: Two times a day (BID) | ORAL | Status: DC
  Administered 2021-10-06 – 2021-10-07 (×2): 100 mg via ORAL
  Filled 2021-10-06 (×2): qty 1

## 2021-10-06 MED ORDER — SODIUM CHLORIDE 0.9 % IR SOLN
Status: DC | PRN
  Administered 2021-10-06: 3000 mL

## 2021-10-06 MED ORDER — PANTOPRAZOLE SODIUM 40 MG PO TBEC: 40.0000 mg | DELAYED_RELEASE_TABLET | Freq: Every day | ORAL | Status: DC

## 2021-10-06 MED ORDER — ACETAMINOPHEN 160 MG/5ML PO SOLN: 1000.0000 mg | Freq: Once | ORAL | Status: DC | PRN

## 2021-10-06 MED ORDER — PROPOFOL 1000 MG/100ML IV EMUL
INTRAVENOUS | Status: AC
  Filled 2021-10-06: qty 100

## 2021-10-06 SURGICAL SUPPLY — 56 items
BAG COUNTER SPONGE SURGICOUNT (BAG) ×2 IMPLANT
BENZOIN TINCTURE PRP APPL 2/3 (GAUZE/BANDAGES/DRESSINGS) IMPLANT
BLADE CLIPPER SURG (BLADE) IMPLANT
BLADE SAW SGTL 18X1.27X75 (BLADE) ×2 IMPLANT
CLSR STERI-STRIP ANTIMIC 1/2X4 (GAUZE/BANDAGES/DRESSINGS) IMPLANT
COVER SURGICAL LIGHT HANDLE (MISCELLANEOUS) ×2 IMPLANT
CUP SECTOR GRIPTON 50MM (Cup) ×2 IMPLANT
DRAPE C-ARM 42X72 X-RAY (DRAPES) ×2 IMPLANT
DRAPE STERI IOBAN 125X83 (DRAPES) ×2 IMPLANT
DRAPE U-SHAPE 47X51 STRL (DRAPES) ×6 IMPLANT
DRESSING AQUACEL AG SP 3.5X10 (GAUZE/BANDAGES/DRESSINGS) IMPLANT
DRSG AQUACEL AG ADV 3.5X10 (GAUZE/BANDAGES/DRESSINGS) ×2 IMPLANT
DRSG AQUACEL AG SP 3.5X10 (GAUZE/BANDAGES/DRESSINGS)
DURAPREP 26ML APPLICATOR (WOUND CARE) ×2 IMPLANT
ELECT BLADE 4.0 EZ CLEAN MEGAD (MISCELLANEOUS) ×2
ELECT BLADE 6.5 EXT (BLADE) IMPLANT
ELECT REM PT RETURN 9FT ADLT (ELECTROSURGICAL) ×2
ELECTRODE BLDE 4.0 EZ CLN MEGD (MISCELLANEOUS) ×1 IMPLANT
ELECTRODE REM PT RTRN 9FT ADLT (ELECTROSURGICAL) ×1 IMPLANT
FACESHIELD WRAPAROUND (MASK) ×4 IMPLANT
GLOVE SRG 8 PF TXTR STRL LF DI (GLOVE) ×2 IMPLANT
GLOVE SURG LTX SZ8 (GLOVE) ×2 IMPLANT
GLOVE SURG ORTHO LTX SZ7.5 (GLOVE) ×4 IMPLANT
GLOVE SURG UNDER POLY LF SZ8 (GLOVE) ×4
GOWN STRL REUS W/ TWL LRG LVL3 (GOWN DISPOSABLE) ×2 IMPLANT
GOWN STRL REUS W/ TWL XL LVL3 (GOWN DISPOSABLE) ×2 IMPLANT
GOWN STRL REUS W/TWL LRG LVL3 (GOWN DISPOSABLE) ×4
GOWN STRL REUS W/TWL XL LVL3 (GOWN DISPOSABLE) ×4
HANDPIECE INTERPULSE COAX TIP (DISPOSABLE) ×2
HEAD FEMORAL 32 CERAMIC (Hips) ×2 IMPLANT
KIT BASIN OR (CUSTOM PROCEDURE TRAY) ×2 IMPLANT
KIT TURNOVER KIT B (KITS) ×2 IMPLANT
LINER ACET PNNCL PLUS4 NEUTRAL (Hips) ×1 IMPLANT
MANIFOLD NEPTUNE II (INSTRUMENTS) ×2 IMPLANT
NS IRRIG 1000ML POUR BTL (IV SOLUTION) ×2 IMPLANT
PACK TOTAL JOINT (CUSTOM PROCEDURE TRAY) ×2 IMPLANT
PAD ARMBOARD 7.5X6 YLW CONV (MISCELLANEOUS) ×2 IMPLANT
PINNACLE PLUS 4 NEUTRAL (Hips) ×2 IMPLANT
SET HNDPC FAN SPRY TIP SCT (DISPOSABLE) ×1 IMPLANT
STAPLER VISISTAT 35W (STAPLE) IMPLANT
STEM FEM SZ3 STD ACTIS (Stem) ×2 IMPLANT
STRIP CLOSURE SKIN 1/2X4 (GAUZE/BANDAGES/DRESSINGS) ×4 IMPLANT
SUT ETHIBOND NAB CT1 #1 30IN (SUTURE) ×2 IMPLANT
SUT MNCRL AB 4-0 PS2 18 (SUTURE) IMPLANT
SUT VIC AB 0 CT1 27 (SUTURE) ×2
SUT VIC AB 0 CT1 27XBRD ANBCTR (SUTURE) ×1 IMPLANT
SUT VIC AB 1 CT1 27 (SUTURE) ×2
SUT VIC AB 1 CT1 27XBRD ANBCTR (SUTURE) ×1 IMPLANT
SUT VIC AB 2-0 CT1 27 (SUTURE) ×2
SUT VIC AB 2-0 CT1 TAPERPNT 27 (SUTURE) ×1 IMPLANT
TOWEL GREEN STERILE (TOWEL DISPOSABLE) ×2 IMPLANT
TOWEL GREEN STERILE FF (TOWEL DISPOSABLE) ×2 IMPLANT
TRAY CATH 16FR W/PLASTIC CATH (SET/KITS/TRAYS/PACK) IMPLANT
TRAY FOLEY W/BAG SLVR 16FR (SET/KITS/TRAYS/PACK)
TRAY FOLEY W/BAG SLVR 16FR ST (SET/KITS/TRAYS/PACK) IMPLANT
WATER STERILE IRR 1000ML POUR (IV SOLUTION) ×4 IMPLANT

## 2021-10-06 NOTE — H&P (Signed)
TOTAL HIP ADMISSION H&P  Patient is admitted for left total hip arthroplasty.  Subjective:  Chief Complaint: left hip pain  HPI: Vicki [name], 55 y.o. female, has a history of pain and functional disability in the left hip(s) due to  avascular necrosis  and patient has failed non-surgical conservative treatments for greater than 12 weeks to include NSAID's and/or analgesics and activity modification.  Onset of symptoms was abrupt starting 1 years ago with rapidlly worsening course since that time.The patient noted no past surgery on the left hip(s).  Patient currently rates pain in the left hip at 10 out of 10 with activity. Patient has night pain, worsening of pain with activity and weight bearing, pain that interfers with activities of daily living, and pain with passive range of motion. Patient has evidence of subchondral cysts and edema with AVN  by imaging studies. This condition presents safety issues increasing the risk of falls. This patient has had avascular necrosis of the hip, acetabular fracture, hip dysplasia.  There is no current active infection.  Patient Active Problem List   Diagnosis Date Noted   Avascular necrosis of hip, left ([place]) 10/06/2021   S/P abdominal hysterectomy 02/25/2014   Depression 07/25/2013   Panic attacks 07/25/2013   Fibroids 05/16/2013   BV (bacterial vaginosis) 05/16/2013   Anemia 04/03/2012   Menorrhagia 04/03/2012   Hypokalemia 04/03/2012   UTI (lower urinary tract infection) 04/03/2012   Emesis 04/03/2012   Past Medical History:  Diagnosis Date   Abnormal Pap smear    Anemia    Anxiety 2020   BV (bacterial vaginosis) 05/16/2013   Depression 2020   Dysrhythmia    Fibroids    uterine   Hypertension    Mental disorder    depression and panic attacks   Tachycardia 2018   pt states for the past 4 years she has went "in and out" of tachycardia    Past Surgical History:  Procedure Laterality Date   ABDOMINAL HYSTERECTOMY     BILATERAL  SALPINGECTOMY Bilateral 02/25/2014   Procedure: BILATERAL SALPINGECTOMY;  Surgeon: Florian Buff, MD;  Location: [facility];  Service: Gynecology;  Laterality: Bilateral;   DILATION AND CURETTAGE OF UTERUS     SUPRACERVICAL ABDOMINAL HYSTERECTOMY N/A 02/25/2014   Procedure: HYSTERECTOMY SUPRACERVICAL ABDOMINAL;  Surgeon: Florian Buff, MD;  Location: [facility];  Service: Gynecology;  Laterality: N/A;   TUBAL LIGATION      Current Facility-Administered Medications  Medication Dose Route Frequency Provider Last Rate Last Admin   ceFAZolin (ANCEF) IVPB 2g/100 mL premix  2 g Intravenous On Call to OR Pete Pelt, PA-C       lactated ringers infusion   Intravenous Continuous Effie Berkshire, MD   New Bag at 10/06/21 0700   tranexamic acid (CYKLOKAPRON) IVPB 1,000 mg  1,000 mg Intravenous To OR Pete Pelt, PA-C       No Known Allergies  Social History   Tobacco Use   Smoking status: Never   Smokeless tobacco: Never  Substance Use Topics   Alcohol use: Yes    Comment: 1 beer daily     Family History  Problem Relation Age of Onset   Congestive Heart Failure Mother    Congestive Heart Failure Maternal Grandmother    Cancer Paternal Grandmother        ovarian     Review of Systems  Musculoskeletal:  Positive for gait problem.  All other systems reviewed and are negative.  Objective:  Physical Exam Vitals reviewed.  Constitutional:      Appearance: Normal appearance.  HENT:     Head: Normocephalic and atraumatic.  Eyes:     Extraocular Movements: Extraocular movements intact.     Pupils: Pupils are equal, round, and reactive to light.  Cardiovascular:     Rate and Rhythm: Normal rate and regular rhythm.  Pulmonary:     Effort: Pulmonary effort is normal.     Breath sounds: Normal breath sounds.  Abdominal:     Palpations: Abdomen is soft.  Musculoskeletal:     Cervical back: Normal range of motion and neck supple.     Left hip: Tenderness and bony tenderness present.  Decreased range of motion. Decreased strength.  Neurological:     Mental Status: She is alert and oriented to person, place, and time.  Psychiatric:        Behavior: Behavior normal.    Vital signs in last 24 hours: Temp:  [98.5 F (36.9 C)-98.6 F (37 C)] 98.5 F (36.9 C) (10/13 0558) Pulse Rate:  [71-77] 77 (10/13 0558) Resp:  [17] 17 (10/13 0558) BP: (153-165)/(75-83) 153/75 (10/13 0558) SpO2:  [97 %-100 %] 97 % (10/13 0558) Weight:  [72.6 kg] 72.6 kg (10/13 0558)  Labs:   Estimated body mass index is 25.06 kg/m as calculated from the following:   Height as of this encounter: 5\' 7"  (1.702 m).   Weight as of this encounter: 72.6 kg.   Imaging Review Plain radiographs and MRI demonstrate severe AVNof the left hip(s). The bone quality appears to be good for age and reported activity level.      Assessment/Plan:  End avascular necrosis, left hip(s)  The patient history, physical examination, clinical judgement of the provider and imaging studies are consistent with end stage AVN of the left hip(s) and total hip arthroplasty is deemed medically necessary. The treatment options including medical management, injection therapy, arthroscopy and arthroplasty were discussed at length. The risks and benefits of total hip arthroplasty were presented and reviewed. The risks due to aseptic loosening, infection, stiffness, dislocation/subluxation,  thromboembolic complications and other imponderables were discussed.  The patient acknowledged the explanation, agreed to proceed with the plan and consent was signed. Patient is being admitted for inpatient treatment for surgery, pain control, PT, OT, prophylactic antibiotics, VTE prophylaxis, progressive ambulation and ADL's and discharge planning.The patient is planning to be discharged home with home health services

## 2021-10-06 NOTE — Anesthesia Procedure Notes (Signed)
Spinal  Patient location during procedure: OR Start time: 10/06/2021 7:34 AM End time: 10/06/2021 7:39 AM Reason for block: surgical anesthesia Staffing Performed: anesthesiologist  Anesthesiologist: Oleta Mouse, MD Preanesthetic Checklist Completed: patient identified, IV checked, risks and benefits discussed, surgical consent, monitors and equipment checked, pre-op evaluation and timeout performed Spinal Block Patient position: sitting Prep: DuraPrep Patient monitoring: heart rate, cardiac monitor, continuous pulse ox and blood pressure Approach: midline Location: L4-5 Injection technique: single-shot Needle Needle type: Pencan  Needle gauge: 24 G Needle length: 9 cm Assessment Sensory level: T6 Events: CSF return

## 2021-10-06 NOTE — Evaluation (Signed)
Physical Therapy Evaluation Patient Details Name: Vicki [name] MRN: [id number] DOB: [birth year] Today's Date: 10/06/2021  History of Present Illness  Pt is a 55 y/o female s/p L THA, direct anterior on 10/13. PMH includes HTN.  Clinical Impression  Pt admitted secondary to problem above with deficits below. Mobility limited this session secondary to nerve block effects. Was able to take a few steps, but they were very uncoordinated and with narrow BOS, requiring min A for steadying. Anticipate pt will progress well once symptoms improve. Will continue to follow acutely.        Recommendations for follow up therapy are one component of a multi-disciplinary discharge planning process, led by the attending physician.  Recommendations may be updated based on patient status, additional functional criteria and insurance authorization.  Follow Up Recommendations Follow surgeon's recommendation for DC plan and follow-up therapies    Equipment Recommendations  None recommended by PT    Recommendations for Other Services       Precautions / Restrictions Precautions Precautions: Fall Restrictions Weight Bearing Restrictions: Yes LLE Weight Bearing: Weight bearing as tolerated      Mobility  Bed Mobility Overal bed mobility: Needs Assistance Bed Mobility: Supine to Sit     Supine to sit: Supervision     General bed mobility comments: Supervision for safety.    Transfers Overall transfer level: Needs assistance Equipment used: Rolling walker (2 wheeled) Transfers: Sit to/from Stand Sit to Stand: Min assist         General transfer comment: Min A for steadying assist. Cues for safe hand placement.  Ambulation/Gait Ambulation/Gait assistance: Min assist Gait Distance (Feet): 2 Feet Assistive device: Rolling walker (2 wheeled) Gait Pattern/deviations: Step-through pattern;Decreased stride length;Narrow base of support Gait velocity: Decreased   General Gait Details: Pt  with uncoordinated steps and very narrow BOS. Noted some buckling as well secondary to block effects, so further mobility limited. Brought chair up behind pt to have her sit.  Stairs            Wheelchair Mobility    Modified Rankin (Stroke Patients Only)       Balance Overall balance assessment: Needs assistance Sitting-balance support: No upper extremity supported;Feet supported Sitting balance-Leahy Scale: Good     Standing balance support: Bilateral upper extremity supported;During functional activity Standing balance-Leahy Scale: Poor Standing balance comment: Reliant on BUE support                             Pertinent Vitals/Pain Pain Assessment: Faces Faces Pain Scale: Hurts little more Pain Location: L hip Pain Descriptors / Indicators: Grimacing;Guarding Pain Intervention(s): Limited activity within patient's tolerance;Monitored during session;Repositioned    Home Living Family/patient expects to be discharged to:: Private residence Living Arrangements: Spouse/significant other Available Help at Discharge: Family Type of Home: Apartment Home Access: Level entry     Home Layout: One level Home Equipment: Environmental consultant - 2 wheels;Cane - single point;Bedside commode      Prior Function Level of Independence: Independent with assistive device(s)         Comments: Was using cane for ambulation.     Hand Dominance        Extremity/Trunk Assessment   Upper Extremity Assessment Upper Extremity Assessment: Overall WFL for tasks assessed    Lower Extremity Assessment Lower Extremity Assessment: LLE deficits/detail LLE Deficits / Details: Pt reporting numbness in LLE from block, especially in feet. Deficits consistent with post op pain and  weakness.       Communication   Communication: No difficulties  Cognition Arousal/Alertness: Awake/alert Behavior During Therapy: WFL for tasks assessed/performed Overall Cognitive Status: Within  Functional Limits for tasks assessed                                        General Comments      Exercises Total Joint Exercises Ankle Circles/Pumps: AROM;Both;10 reps;Seated Quad Sets: AROM;Left;5 reps   Assessment/Plan    PT Assessment Patient needs continued PT services  PT Problem List Decreased strength;Decreased activity tolerance;Decreased balance;Decreased mobility;Decreased knowledge of use of DME;Decreased knowledge of precautions;Decreased coordination       PT Treatment Interventions DME instruction;Gait training;Stair training;Functional mobility training;Therapeutic activities;Therapeutic exercise;Balance training;Patient/family education    PT Goals (Current goals can be found in the Care Plan section)  Acute Rehab PT Goals Patient Stated Goal: to go home PT Goal Formulation: With patient Time For Goal Achievement: 10/20/21 Potential to Achieve Goals: Good    Frequency 7X/week   Barriers to discharge        Co-evaluation               AM-PAC PT "6 Clicks" Mobility  Outcome Measure Help needed turning from your back to your side while in a flat bed without using bedrails?: None Help needed moving from lying on your back to sitting on the side of a flat bed without using bedrails?: None Help needed moving to and from a bed to a chair (including a wheelchair)?: A Little Help needed standing up from a chair using your arms (e.g., wheelchair or bedside chair)?: A Little Help needed to walk in hospital room?: A Lot Help needed climbing 3-5 steps with a railing? : Total 6 Click Score: 17    End of Session   Activity Tolerance: Patient tolerated treatment well Patient left: in chair;with call bell/phone within reach Nurse Communication: Mobility status PT Visit Diagnosis: Unsteadiness on feet (R26.81);Muscle weakness (generalized) (M62.81);Other abnormalities of gait and mobility (R26.89)    Time: 3291-9166 PT Time Calculation (min)  (ACUTE ONLY): 15 min   Charges:   PT Evaluation $PT Eval Low Complexity: 1 Low          Lou Miner, DPT  Acute Rehabilitation Services  Pager: [phone] Office: [phone]   Rudean Hitt 10/06/2021, 2:10 PM

## 2021-10-06 NOTE — Transfer of Care (Signed)
Immediate Anesthesia Transfer of Care Note  Patient: Vicki [name]  Procedure(s) Performed: LEFT TOTAL HIP ARTHROPLASTY ANTERIOR APPROACH (Left: Hip)  Patient Location: PACU  Anesthesia Type:MAC and Spinal  Level of Consciousness: awake, alert , patient cooperative and responds to stimulation  Airway & Oxygen Therapy: Patient Spontanous Breathing and Patient connected to nasal cannula oxygen  Post-op Assessment: Report given to RN, Post -op Vital signs reviewed and stable and Patient moving all extremities X 4  Post vital signs: Reviewed and stable  Last Vitals:  Vitals Value Taken Time  BP 130/78 10/06/21 0905  Temp    Pulse 65 10/06/21 0907  Resp 19 10/06/21 0907  SpO2 100 % 10/06/21 0907  Vitals shown include unvalidated device data.  Last Pain:  Vitals:   10/06/21 0612  TempSrc:   PainSc: 7          Complications: No notable events documented.

## 2021-10-06 NOTE — Brief Op Note (Signed)
10/06/2021  8:57 AM  PATIENT:  Vicki [name]  55 y.o. female  PRE-OPERATIVE DIAGNOSIS:  LEFT HIP AVASCULAR NECROSIS  POST-OPERATIVE DIAGNOSIS:  LEFT HIP AVASCULAR NECROSIS  PROCEDURE:  Procedure(s): LEFT TOTAL HIP ARTHROPLASTY ANTERIOR APPROACH (Left)  SURGEON:  Surgeon(s) and Role:    Mcarthur Rossetti, MD - Primary  PHYSICIAN ASSISTANT:  Benita Stabile, PA-C  ANESTHESIA:   spinal  EBL:  200 mL   COUNTS:  YES  DICTATION: .Other Dictation: Dictation Number [id number]  PLAN OF CARE: Admit for overnight observation  PATIENT DISPOSITION:  PACU - hemodynamically stable.   Delay start of Pharmacological VTE agent (>24hrs) due to surgical blood loss or risk of bleeding: no

## 2021-10-06 NOTE — Anesthesia Procedure Notes (Addendum)
Procedure Name: MAC Date/Time: 10/06/2021 7:30 AM Performed by: Michele Rockers, CRNA Pre-anesthesia Checklist: Patient identified, Emergency Drugs available, Suction available, Timeout performed and Patient being monitored Patient Re-evaluated:Patient Re-evaluated prior to induction Oxygen Delivery Method: Simple face mask

## 2021-10-06 NOTE — Anesthesia Preprocedure Evaluation (Signed)
Anesthesia Evaluation  Patient identified by MRN, date of birth, ID band Patient awake    Reviewed: Allergy & Precautions, NPO status , Patient's Chart, lab work & pertinent test results, reviewed documented beta blocker date and time   History of Anesthesia Complications Negative for: history of anesthetic complications  Airway Mallampati: III  TM Distance: >3 FB Neck ROM: Full    Dental  (+) Dental Advisory Given, Teeth Intact   Pulmonary    breath sounds clear to auscultation       Cardiovascular hypertension, Pt. on medications and Pt. on home beta blockers (-) angina(-) Past MI, (-) Cardiac Stents and (-) CHF + dysrhythmias  Rhythm:Regular     Neuro/Psych PSYCHIATRIC DISORDERS Anxiety Depression negative neurological ROS     GI/Hepatic Neg liver ROS, GERD  Controlled,  Endo/Other  negative endocrine ROS  Renal/GU negative Renal ROS     Musculoskeletal   Abdominal   Peds  Hematology negative hematology ROS (+) Lab Results      Component                Value               Date                      WBC                      8.3                 10/05/2021                HGB                      13.7                10/05/2021                HCT                      41.4                10/05/2021                MCV                      85.4                10/05/2021                PLT                      274                 10/05/2021             Denies blood thinners   Anesthesia Other Findings   Reproductive/Obstetrics                             Anesthesia Physical Anesthesia Plan  ASA: 2  Anesthesia Plan: MAC and Spinal   Post-op Pain Management:    Induction:   PONV Risk Score and Plan: 2 and Propofol infusion and Treatment may vary due to age or medical condition  Airway Management Planned: Nasal Cannula  Additional Equipment: None  Intra-op Plan:   Post-operative  Plan:   Informed Consent:  I have reviewed the patients History and Physical, chart, labs and discussed the procedure including the risks, benefits and alternatives for the proposed anesthesia with the patient or authorized representative who has indicated his/her understanding and acceptance.     Dental advisory given  Plan Discussed with: CRNA and Anesthesiologist  Anesthesia Plan Comments:         Anesthesia Quick Evaluation

## 2021-10-06 NOTE — Discharge Instructions (Signed)

## 2021-10-06 NOTE — Op Note (Signed)
Vicki [name], Vicki [name] MEDICAL RECORD NO: [id number] ACCOUNT NO: [id number] DATE OF BIRTH: [birth year] FACILITY: [facility] LOCATION: [facility] PHYSICIAN: Lind Guest. Ninfa Linden, MD  Operative Report   DATE OF PROCEDURE: 10/06/2021  PREOPERATIVE DIAGNOSIS:  Avascular necrosis, left hip.  POSTOPERATIVE DIAGNOSIS:  Avascular necrosis, left hip.  PROCEDURE:  Left total hip arthroplasty through direct anterior approach.  IMPLANTS:  DePuy Sector Gription acetabular component size 50, size 32+4 neutral polyethylene liner, size 3 ACTIS femoral component with standard offset, size 32+1 ceramic hip ball.  SURGEON:  Lind Guest. Ninfa Linden, MD  ASSISTANT:  Erskine Emery, PA-C  ANESTHESIA:  Spinal.  ANTIBIOTICS:  2 g IV Ancef.  BLOOD LOSS: 790-240 mL  COMPLICATIONS:  None.  INDICATIONS:  The patient is a 55 year old female who unfortunately has bilateral hip avascular necrosis with left hip much worse than the right.  This was confirmed on plain films.  Her physical exam shows severe bilateral hip pain with left worse than  right.  It hurts with mobility and it is detrimentally affecting her quality of life and her activities of daily living.  I saw her in the office as a referral and went over her MRIs of her hips and recommended total hip arthroplasty surgery given the  nature of her AVN and impending femoral head collapse.  We did discuss the risk of acute blood loss anemia, nerve and vessel injury, fracture, infection, DVT, implant failure and skin and soft tissue issues.  We also talked about leg length differences.   We described hopefully our goals being decrease pain, improve mobility and overall improve quality of life.  DESCRIPTION OF PROCEDURE:  After informed consent was obtained, appropriate left hip was marked.  She was brought to the operating room and sat up on the stretcher where spinal anesthesia was obtained.  She was laid in the supine position on the  stretcher.  Foley catheter  was placed.  Both feet had traction boots applied to them.  Next, she was placed supine on the Hana fracture table, the perineal post in place and both legs in line skeletal traction device and no traction applied.  Her left  operative hip was prepped and draped with DuraPrep and sterile drapes.  A timeout was called, and she was identified correct patient, correct left hip.  I then made an incision just inferior and posterior to the anterior superior iliac spine and carried  this obliquely down the leg.  We dissected down tensor fascia lata muscle.  Tensor fascia was then divided longitudinally to proceed with direct anterior approach to the hip.  We identified and cauterized circumflex vessels, then identified the hip  capsule, opened the hip capsule in L-type format, finding a very large joint effusion.  This was consistent with AVN as well.  We placed Cobra retractors around the medial and lateral femoral neck and made our femoral neck cut with an oscillating saw  just proximal to the lesser trochanter and completed this with an osteotome.  We placed a corkscrew guide in the femoral head and removed the femoral head in its entirety and found an area where the cartilage was fragile and flaking off consistent with  avascular necrosis.  The femoral head was very soft.  I then placed a bent Hohmann over the medial acetabular rim and removed remnants of the acetabular labrum and other debris.  We then began reaming under direct visualization from a size 43 reamer in  stepwise increments going up to a size 49, with all reamers  placed under direct visualization and the last reamer  placed under direct fluoroscopy, so we could obtain our depth of reaming, our inclination and anteversion.  We then placed a real DePuy  Sector Gription acetabular component size 50 and a 32+4 neutral polyethylene liner based on medialization of the cup and her offset.  Attention was then turned to the femur. With the leg externally  rotated to 120 degrees and extended and adducted, we are  to place a Mueller retractor medially and Hohmann retractor behind the greater trochanter. We released lateral joint capsule and used a box cutting osteotome to enter the femoral canal and a rongeur to lateralize, then began broaching using the ACTIS  broaching system from a size 0 going up to size 3. With a size 3 in place, we trialed a standard offset femoral neck and a 32+1 trial hip ball.  We brought the leg back over and up and with traction and internal rotation, reduced it in the pelvis.  We  assessed it mechanically and radiographically and we were pleased with leg length, offset, range of motion and stability.  I think she is slightly longer on this side; however, she does have AVN on her right side, which will be addressed at a later date  given her continued pain.  We then dislocated the hip, removed the trial components.  We placed the real ACTIS femoral component size 3 with standard offset and the real 32+1 ceramic hip ball and again reduced this in the acetabulum. We were pleased with  stability, assessed mechanically and radiographically.  We then irrigated the soft tissue with normal saline solution.  We closed the joint capsule with interrupted #1 Ethibond suture followed by #1 Vicryl to close the tensor fascia, 0 Vicryl was used  to close the deep tissue and 2-0 Vicryl was used to close the subcutaneous tissue.  The skin was reapproximated with staples.  Aquacel dressing was placed.  She was taken off the Hana table and taken to recovery room in stable condition with all final  counts being correct and no complications noted.  Of note, Benita Stabile, PA-C, did assist during the entire case and assistance was crucial for facilitating all aspects of this case.   SHW D: 10/06/2021 8:54:00 am T: 10/06/2021 10:10:00 am  JOB: [id number]/ [id number]

## 2021-10-07 DIAGNOSIS — M87052 Idiopathic aseptic necrosis of left femur: Secondary | ICD-10-CM | POA: Diagnosis not present

## 2021-10-07 LAB — CBC
HCT: 34.8 % — ABNORMAL LOW (ref 36.0–46.0)
Hemoglobin: 11.7 g/dL — ABNORMAL LOW (ref 12.0–15.0)
MCH: 28.5 pg (ref 26.0–34.0)
MCHC: 33.6 g/dL (ref 30.0–36.0)
MCV: 84.7 fL (ref 80.0–100.0)
Platelets: 231 10*3/uL (ref 150–400)
RBC: 4.11 MIL/uL (ref 3.87–5.11)
RDW: 14.5 % (ref 11.5–15.5)
WBC: 10.6 10*3/uL — ABNORMAL HIGH (ref 4.0–10.5)
nRBC: 0 % (ref 0.0–0.2)

## 2021-10-07 LAB — BASIC METABOLIC PANEL
Anion gap: 9 (ref 5–15)
BUN: 7 mg/dL (ref 6–20)
CO2: 22 mmol/L (ref 22–32)
Calcium: 9.1 mg/dL (ref 8.9–10.3)
Chloride: 102 mmol/L (ref 98–111)
Creatinine, Ser: 0.98 mg/dL (ref 0.44–1.00)
GFR, Estimated: >60 mL/min (ref >60)
Glucose, Bld: 141 mg/dL — ABNORMAL HIGH (ref 70–99)
Potassium: 3.9 mmol/L (ref 3.5–5.1)
Sodium: 133 mmol/L — ABNORMAL LOW (ref 135–145)

## 2021-10-07 NOTE — Progress Notes (Signed)
Physical Therapy Treatment Patient Details Name: Vicki [name] MRN: [id number] DOB: October 10, 1966 Today's Date: 10/07/2021   History of Present Illness Pt is a 54 y/o female s/p L THA, direct anterior on 10/13. PMH includes HTN.    PT Comments    Pt tolerates treatment well, ambulating for limited household distances. Pt demonstrates improved ease of bed mobility, continuing to utilize RLE to assist LLE. PT reinforces HEP. Pt will benefit from continued aggressive mobilization to aide in a return to independence.   Recommendations for follow up therapy are one component of a multi-disciplinary discharge planning process, led by the attending physician.  Recommendations may be updated based on patient status, additional functional criteria and insurance authorization.  Follow Up Recommendations  Follow surgeon's recommendation for DC plan and follow-up therapies     Equipment Recommendations  None recommended by PT    Recommendations for Other Services       Precautions / Restrictions Precautions Precautions: Fall;Anterior Hip Precaution Booklet Issued: No Precaution Comments: PT reviews anterior hip precautions, avoiding hip extension Restrictions Weight Bearing Restrictions: Yes LLE Weight Bearing: Weight bearing as tolerated     Mobility  Bed Mobility Overal bed mobility: Needs Assistance Bed Mobility: Supine to Sit     Supine to sit: Supervision Sit to supine: Supervision   General bed mobility comments: PT provides cues to hook RLE under LLE to assist in bed mobility due to LLE pain and weakness    Transfers Overall transfer level: Needs assistance Equipment used: Rolling walker (2 wheeled) Transfers: Sit to/from Stand Sit to Stand: Supervision            Ambulation/Gait Ambulation/Gait assistance: Supervision Gait Distance (Feet): 120 Feet Assistive device: Rolling walker (2 wheeled) Gait Pattern/deviations: Step-through pattern Gait velocity:  reduced Gait velocity interpretation: 1.31 - 2.62 ft/sec, indicative of limited community ambulator General Gait Details: pt with slowed step-through gait, reduced stride length. PT providing cues for initial contact of heel strike rather than toe strike   Stairs             Wheelchair Mobility    Modified Rankin (Stroke Patients Only)       Balance Overall balance assessment: Needs assistance Sitting-balance support: No upper extremity supported;Feet supported Sitting balance-Leahy Scale: Good     Standing balance support: Bilateral upper extremity supported Standing balance-Leahy Scale: Poor Standing balance comment: benefits from UE support of RW                            Cognition Arousal/Alertness: Awake/alert Behavior During Therapy: WFL for tasks assessed/performed Overall Cognitive Status: Within Functional Limits for tasks assessed                                        Exercises      General Comments General comments (skin integrity, edema, etc.): VSS on RA      Pertinent Vitals/Pain Pain Assessment: Faces Faces Pain Scale: Hurts little more Pain Location: L hip Pain Descriptors / Indicators: Sore Pain Intervention(s): Monitored during session    Home Living                      Prior Function            PT Goals (current goals can now be found in the care plan section) Acute Rehab PT  Goals Patient Stated Goal: to go home Progress towards PT goals: Progressing toward goals    Frequency    7X/week      PT Plan Current plan remains appropriate    Co-evaluation              AM-PAC PT "6 Clicks" Mobility   Outcome Measure  Help needed turning from your back to your side while in a flat bed without using bedrails?: A Little Help needed moving from lying on your back to sitting on the side of a flat bed without using bedrails?: A Little Help needed moving to and from a bed to a chair  (including a wheelchair)?: A Little Help needed standing up from a chair using your arms (e.g., wheelchair or bedside chair)?: A Little Help needed to walk in hospital room?: A Little Help needed climbing 3-5 steps with a railing? : A Little 6 Click Score: 18    End of Session   Activity Tolerance: Patient tolerated treatment well Patient left: in bed;with call bell/phone within reach Nurse Communication: Mobility status PT Visit Diagnosis: Unsteadiness on feet (R26.81);Muscle weakness (generalized) (M62.81);Other abnormalities of gait and mobility (R26.89)     Time: 0258-5277 PT Time Calculation (min) (ACUTE ONLY): 14 min  Charges:  $Gait Training: 8-22 mins $Therapeutic Activity: 8-22 mins                     Zenaida Niece, PT, DPT Acute Rehabilitation Pager: [phone] Office [place] [place] 10/07/2021, 12:05 PM

## 2021-10-07 NOTE — Progress Notes (Signed)
Subjective: 1 Day Post-Op Procedure(s) (LRB): LEFT TOTAL HIP ARTHROPLASTY ANTERIOR APPROACH (Left) Patient reports pain as moderate.    Objective: Vital signs in last 24 hours: Temp:  [97.5 F (36.4 C)-99.4 F (37.4 C)] 98.7 F (37.1 C) (10/14 0448) Pulse Rate:  [54-99] 99 (10/14 0448) Resp:  [11-18] 18 (10/14 0448) BP: (124-155)/(69-88) 124/83 (10/14 0448) SpO2:  [96 %-100 %] 99 % (10/14 0448)  Intake/Output from previous day: 10/13 0701 - 10/14 0700 In: 1000 [I.V.:800; IV Piggyback:200] Out: 1500 [Urine:1300; Blood:200] Intake/Output this shift: Total I/O In: -  Out: 1150 [Urine:1150]  Recent Labs    10/05/21 1600 10/07/21 0152  HGB 13.7 11.7*   Recent Labs    10/05/21 1600 10/07/21 0152  WBC 8.3 10.6*  RBC 4.85 4.11  HCT 41.4 34.8*  PLT 274 231   Recent Labs    10/05/21 1600 10/07/21 0152  NA 136 133*  K 3.8 3.9  CL 103 102  CO2 21* 22  BUN 13 7  CREATININE 0.96 0.98  GLUCOSE 97 141*  CALCIUM 9.8 9.1   No results for input(s): LABPT, INR in the last 72 hours.  Sensation intact distally Intact pulses distally Dorsiflexion/Plantar flexion intact Incision: scant drainage   Assessment/Plan: 1 Day Post-Op Procedure(s) (LRB): LEFT TOTAL HIP ARTHROPLASTY ANTERIOR APPROACH (Left) Up with therapy Discharge home with home health after PT.      Mcarthur Rossetti 10/07/2021, 6:34 AM

## 2021-10-07 NOTE — Progress Notes (Signed)
Physical Therapy Treatment Patient Details Name: Vicki [name] MRN: [id number] DOB: 1966/01/28 Today's Date: 10/07/2021   History of Present Illness Pt is a 54 y/o female s/p L THA, direct anterior on 10/13. PMH includes HTN.    PT Comments    Pt tolerates treatment well, ambulating for increased distances with improved coordination compared to eval. PT provides cues to improve independence in bed mobility. Pt continues to demonstrates reduced gait speed and LE strength/power deficits. PT provides education on post-op hip HEP to improve LLE strength. Pt will benefit from continued acute PT services to aide in a return to independence.  Recommendations for follow up therapy are one component of a multi-disciplinary discharge planning process, led by the attending physician.  Recommendations may be updated based on patient status, additional functional criteria and insurance authorization.  Follow Up Recommendations  Follow surgeon's recommendation for DC plan and follow-up therapies     Equipment Recommendations  None recommended by PT (pt already owns RW)    Recommendations for Other Services       Precautions / Restrictions Precautions Precautions: Fall;Anterior Hip Precaution Booklet Issued: No Precaution Comments: PT reviews anterior hip precautions, avoiding hip extension Restrictions Weight Bearing Restrictions: Yes LLE Weight Bearing: Weight bearing as tolerated     Mobility  Bed Mobility Overal bed mobility: Needs Assistance Bed Mobility: Supine to Sit;Sit to Supine     Supine to sit: Supervision Sit to supine: Supervision   General bed mobility comments: PT provides cues to hook RLE under LLE to assist in bed mobility due to LLE pain and weakness    Transfers Overall transfer level: Needs assistance Equipment used: Rolling walker (2 wheeled) Transfers: Sit to/from Stand Sit to Stand: Supervision            Ambulation/Gait Ambulation/Gait assistance:  Supervision Gait Distance (Feet): 200 Feet Assistive device: Rolling walker (2 wheeled) Gait Pattern/deviations: Step-through pattern Gait velocity: reduced Gait velocity interpretation: 1.31 - 2.62 ft/sec, indicative of limited community ambulator General Gait Details: pt with slowed step-through gait, reduced stride length and gait speed   Stairs             Wheelchair Mobility    Modified Rankin (Stroke Patients Only)       Balance Overall balance assessment: Needs assistance Sitting-balance support: No upper extremity supported;Feet supported Sitting balance-Leahy Scale: Good     Standing balance support: Single extremity supported;Bilateral upper extremity supported Standing balance-Leahy Scale: Poor Standing balance comment: benefits from UE support of RW                            Cognition Arousal/Alertness: Awake/alert Behavior During Therapy: WFL for tasks assessed/performed Overall Cognitive Status: Within Functional Limits for tasks assessed                                        Exercises      General Comments General comments (skin integrity, edema, etc.): VSS on RA, dressing clean, dry, intact      Pertinent Vitals/Pain Pain Assessment: Faces Faces Pain Scale: Hurts even more Pain Location: L hip Pain Descriptors / Indicators: Sore Pain Intervention(s): Monitored during session    Home Living                      Prior Function  PT Goals (current goals can now be found in the care plan section) Acute Rehab PT Goals Patient Stated Goal: to go home Progress towards PT goals: Progressing toward goals    Frequency    7X/week      PT Plan Current plan remains appropriate    Co-evaluation              AM-PAC PT "6 Clicks" Mobility   Outcome Measure  Help needed turning from your back to your side while in a flat bed without using bedrails?: A Little Help needed moving from  lying on your back to sitting on the side of a flat bed without using bedrails?: A Little Help needed moving to and from a bed to a chair (including a wheelchair)?: A Little Help needed standing up from a chair using your arms (e.g., wheelchair or bedside chair)?: A Little Help needed to walk in hospital room?: A Little Help needed climbing 3-5 steps with a railing? : A Little 6 Click Score: 18    End of Session   Activity Tolerance: Patient tolerated treatment well Patient left: in bed;with call bell/phone within reach Nurse Communication: Mobility status PT Visit Diagnosis: Unsteadiness on feet (R26.81);Muscle weakness (generalized) (M62.81);Other abnormalities of gait and mobility (R26.89)     Time: 7867-6720 PT Time Calculation (min) (ACUTE ONLY): 23 min  Charges:  $Gait Training: 8-22 mins $Therapeutic Activity: 8-22 mins                     Zenaida Niece, PT, DPT Acute Rehabilitation Pager: [phone] Office [facility] 10/07/2021, 10:57 AM

## 2021-10-07 NOTE — Progress Notes (Signed)
Patient alert and oriented, voiding adequately, skin clean, dry and intact without evidence of skin break down, or symptoms of complications - no redness or edema noted, only slight tenderness at site.  Patient states pain is manageable at time of discharge. Patient has an appointment with MD in 2 weeks 

## 2021-10-07 NOTE — Discharge Summary (Signed)
Patient ID: Vicki [name] MRN: [id number] DOB/AGE: Feb 08, 1966 55 y.o.  Admit date: 10/06/2021 Discharge date: 10/07/2021  Admission Diagnoses:  Principal Problem:   Avascular necrosis of hip, left [facility]) Active Problems:   Status post left hip replacement   Discharge Diagnoses:  Same  Past Medical History:  Diagnosis Date   Abnormal Pap smear    Anemia    Anxiety 2020   BV (bacterial vaginosis) 05/16/2013   Depression 2020   Dysrhythmia    Fibroids    uterine   Hypertension    Mental disorder    depression and panic attacks   Tachycardia 2018   pt states for the past 4 years she has went "in and out" of tachycardia    Surgeries: Procedure(s): LEFT TOTAL HIP ARTHROPLASTY ANTERIOR APPROACH on 10/06/2021   Consultants:   Discharged Condition: Improved  Hospital Course: Vicki [name] is an 55 y.o. female who was admitted 10/06/2021 for operative treatment ofAvascular necrosis of hip, left ([place]). Patient has severe unremitting pain that affects sleep, daily activities, and work/hobbies. After pre-op clearance the patient was taken to the operating room on 10/06/2021 and underwent  Procedure(s): LEFT TOTAL HIP ARTHROPLASTY ANTERIOR APPROACH.    Patient was given perioperative antibiotics:  Anti-infectives (From admission, onward)    Start     Dose/Rate Route Frequency Ordered Stop   10/06/21 1330  ceFAZolin (ANCEF) IVPB 1 g/50 mL premix        1 g 100 mL/hr over 30 Minutes Intravenous Every 6 hours 10/06/21 1028 10/06/21 1827   10/06/21 0600  ceFAZolin (ANCEF) IVPB 2g/100 mL premix        2 g 200 mL/hr over 30 Minutes Intravenous On call to O.R. 10/06/21 0558 10/06/21 0800        Patient was given sequential compression devices, early ambulation, and chemoprophylaxis to prevent DVT.  Patient benefited maximally from hospital stay and there were no complications.    Recent vital signs: Patient Vitals for the past 24 hrs:  BP Temp Temp src Pulse Resp SpO2   10/07/21 1130 132/83 99.3 F (37.4 C) Oral (!) 104 18 92 %  10/07/21 0739 118/80 99.9 F (37.7 C) Oral (!) 104 19 95 %  10/07/21 0448 124/83 98.7 F (37.1 C) Oral 99 18 99 %  10/06/21 2323 132/70 99 F (37.2 C) Oral 93 18 96 %  10/06/21 2321 132/70 99 F (37.2 C) Oral 92 18 97 %  10/06/21 2007 (!) 141/69 99.4 F (37.4 C) Oral 86 18 100 %     Recent laboratory studies:  Recent Labs    10/05/21 1600 10/07/21 0152  WBC 8.3 10.6*  HGB 13.7 11.7*  HCT 41.4 34.8*  PLT 274 231  NA 136 133*  K 3.8 3.9  CL 103 102  CO2 21* 22  BUN 13 7  CREATININE 0.96 0.98  GLUCOSE 97 141*  CALCIUM 9.8 9.1     Discharge Medications:   Allergies as of 10/07/2021   No Known Allergies      Medication List     TAKE these medications    amLODipine 5 MG tablet Commonly known as: NORVASC Take 1 tablet (5 mg total) by mouth daily.   aspirin 81 MG chewable tablet Chew 1 tablet (81 mg total) by mouth 2 (two) times daily.   carvedilol 3.125 MG tablet Commonly known as: Coreg Take 1 tablet (3.125 mg total) by mouth 2 (two) times daily with a meal.   cholecalciferol 25 MCG (1000  UNIT) tablet Commonly known as: VITAMIN D3 Take 1,000 Units by mouth daily.   citalopram 40 MG tablet Commonly known as: CELEXA Take 40 mg by mouth daily.   diphenhydramine-acetaminophen 25-500 MG Tabs tablet Commonly known as: TYLENOL PM Take 1 tablet by mouth at bedtime.   ibuprofen 400 MG tablet Commonly known as: ADVIL Take 1 tablet (400 mg total) by mouth every 6 (six) hours as needed. What changed: reasons to take this   meclizine 25 MG tablet Commonly known as: ANTIVERT Take 1 tablet (25 mg total) by mouth 3 (three) times daily as needed for dizziness.   methocarbamol 500 MG tablet Commonly known as: ROBAXIN Take 1 tablet (500 mg total) by mouth every 6 (six) hours as needed for muscle spasms.   oxyCODONE 5 MG immediate release tablet Commonly known as: Oxy IR/ROXICODONE Take 1-2 tablets  (5-10 mg total) by mouth every 6 (six) hours as needed for moderate pain (pain score 4-6).               Durable Medical Equipment  (From admission, onward)           Start     Ordered   10/06/21 1029  DME 3 n 1  Once        10/06/21 1028   10/06/21 1029  DME Walker rolling  Once       Question Answer Comment  Walker: With 5 Inch Wheels   Patient needs a walker to treat with the following condition Status post left hip replacement      10/06/21 1028            Diagnostic Studies: DG Pelvis Portable  Result Date: 10/06/2021 CLINICAL DATA:  Status post left hip arthroplasty EXAM: OPERATIVE LEFT HIP (WITH PELVIS IF PERFORMED) 2 VIEWS TECHNIQUE: Fluoroscopic spot image(s) were submitted for interpretation post-operatively. Postoperative portable radiograph was obtained. COMPARISON:  None. FLUOROSCOPY TIME:  18 seconds FINDINGS: Intraoperative fluoroscopic images demonstrate left hip total arthroplasty with satisfactory position of components and no obvious perihardware. Postoperative radiographs of the pelvis demonstrate left hip total arthroplasty without evidence of perihardware fracture or component malpositioning and expected overlying postoperative change. IMPRESSION: Satisfactory appearance of left hip arthroplasty. Electronically Signed   By: Delanna Ahmadi M.D.   On: 10/06/2021 11:35   DG C-Arm 1-60 Min-No Report  Result Date: 10/06/2021 CLINICAL DATA:  Status post left hip arthroplasty EXAM: OPERATIVE LEFT HIP (WITH PELVIS IF PERFORMED) 2 VIEWS TECHNIQUE: Fluoroscopic spot image(s) were submitted for interpretation post-operatively. Postoperative portable radiograph was obtained. COMPARISON:  None. FLUOROSCOPY TIME:  18 seconds FINDINGS: Intraoperative fluoroscopic images demonstrate left hip total arthroplasty with satisfactory position of components and no obvious perihardware. Postoperative radiographs of the pelvis demonstrate left hip total arthroplasty without  evidence of perihardware fracture or component malpositioning and expected overlying postoperative change. IMPRESSION: Satisfactory appearance of left hip arthroplasty. Electronically Signed   By: Delanna Ahmadi M.D.   On: 10/06/2021 11:35   DG HIP OPERATIVE UNILAT WITH PELVIS LEFT  Result Date: 10/06/2021 CLINICAL DATA:  Status post left hip arthroplasty EXAM: OPERATIVE LEFT HIP (WITH PELVIS IF PERFORMED) 2 VIEWS TECHNIQUE: Fluoroscopic spot image(s) were submitted for interpretation post-operatively. Postoperative portable radiograph was obtained. COMPARISON:  None. FLUOROSCOPY TIME:  18 seconds FINDINGS: Intraoperative fluoroscopic images demonstrate left hip total arthroplasty with satisfactory position of components and no obvious perihardware. Postoperative radiographs of the pelvis demonstrate left hip total arthroplasty without evidence of perihardware fracture or component malpositioning and expected overlying postoperative  change. IMPRESSION: Satisfactory appearance of left hip arthroplasty. Electronically Signed   By: Delanna Ahmadi M.D.   On: 10/06/2021 11:35    Disposition: Discharge disposition: 01-Home or [place]     Mcarthur Rossetti, MD Follow up in 2 week(s).   Specialty: Orthopedic Surgery Contact information: [address] [place] [address] [phone]                  Signed: Mcarthur Rossetti 10/07/2021, 1:35 PM

## 2021-10-10 ENCOUNTER — Encounter (HOSPITAL_COMMUNITY): Payer: Self-pay | Admitting: Orthopaedic Surgery

## 2021-10-11 ENCOUNTER — Encounter (HOSPITAL_COMMUNITY): Payer: Self-pay | Admitting: Orthopaedic Surgery

## 2021-10-11 NOTE — Anesthesia Postprocedure Evaluation (Signed)
Anesthesia Post Note  Patient: Vicki [name]  Procedure(s) Performed: LEFT TOTAL HIP ARTHROPLASTY ANTERIOR APPROACH (Left: Hip)     Patient location during evaluation: PACU Anesthesia Type: MAC and Spinal Level of consciousness: awake and alert Pain management: pain level controlled Vital Signs Assessment: post-procedure vital signs reviewed and stable Respiratory status: spontaneous breathing, nonlabored ventilation, respiratory function stable and patient connected to nasal cannula oxygen Cardiovascular status: stable and blood pressure returned to baseline Postop Assessment: no apparent nausea or vomiting Anesthetic complications: no   No notable events documented.  Last Vitals:  Vitals:   10/07/21 0739 10/07/21 1130  BP: 118/80 132/83  Pulse: (!) 104 (!) 104  Resp: 19 18  Temp: 37.7 C 37.4 C  SpO2: 95% 92%    Last Pain:  Vitals:   10/07/21 1130  TempSrc: Oral  PainSc:                  Vicki [name]

## 2021-10-20 ENCOUNTER — Encounter: Payer: Self-pay | Admitting: Orthopaedic Surgery

## 2021-10-20 ENCOUNTER — Ambulatory Visit (INDEPENDENT_AMBULATORY_CARE_PROVIDER_SITE_OTHER): Payer: BC Managed Care – PPO | Admitting: Orthopaedic Surgery

## 2021-10-20 ENCOUNTER — Other Ambulatory Visit: Payer: Self-pay

## 2021-10-20 DIAGNOSIS — Z96642 Presence of left artificial hip joint: Secondary | ICD-10-CM

## 2021-10-20 MED ORDER — OXYCODONE HCL 5 MG PO TABS: 5.0000 mg | ORAL_TABLET | Freq: Four times a day (QID) | ORAL | 0 refills | Status: DC | PRN

## 2021-10-20 NOTE — Progress Notes (Signed)
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  She is doing well and reports good range of motion and strength.  She does need a refill on pain medication.  She is only 55 years old and it was significant AVN.  She does have a small amount of AVN in her right hip.  On exam the staples been removed and Steri-Strips applied to right hip incision.  I do feel a firm hematoma but not a seroma.  Her leg lengths appear equal.  Her calf is soft and there is no swelling in her foot so we will have her stop her aspirin.  She can continue to increase her activities as comfort allows and will transition from a walker to a cane when she is comfortable.  I will send in some more oxycodone for her.  I would like to see her back in 4 weeks for repeat exam but no x-rays are needed.

## 2021-11-01 ENCOUNTER — Telehealth: Payer: Self-pay | Admitting: Orthopaedic Surgery

## 2021-11-01 NOTE — Telephone Encounter (Signed)
Received call from patient, needs oow note faxed [employer]/employer nurse, attn: Juliann Pulse [phone] and HR Kennyth Lose [phone]

## 2021-11-01 NOTE — Telephone Encounter (Signed)
Faxed to provided numbers

## 2021-11-16 ENCOUNTER — Encounter: Payer: BC Managed Care – PPO | Admitting: Orthopaedic Surgery

## 2021-11-28 ENCOUNTER — Encounter: Payer: Self-pay | Admitting: Orthopaedic Surgery

## 2021-12-07 ENCOUNTER — Encounter: Payer: Self-pay | Admitting: Physician Assistant

## 2021-12-07 ENCOUNTER — Ambulatory Visit (INDEPENDENT_AMBULATORY_CARE_PROVIDER_SITE_OTHER): Payer: Self-pay | Admitting: Physician Assistant

## 2021-12-07 DIAGNOSIS — Z96642 Presence of left artificial hip joint: Secondary | ICD-10-CM

## 2021-12-07 NOTE — Progress Notes (Signed)
° °  HPI: Vicki [name] returns today status post left total hip arthroplasty 10/06/2021.  She states overall that her left hip is doing well.  States that her surgical incisions healed well.  Ranks her pain to be 0 out of 10 pain.  Taking no pain medication.  She is asking to return to work on 12th 1622 without any restrictions.  She denies any fevers chills shortness of breath chest pain.  Review of systems: See HPI otherwise negative  Physical exam: General well-developed well-nourished female no acute distress.  Affect appropriate Left hip fluid range of motion with internal and external rotation without pain.  Left calf supple nontender.  Dorsiflexion plantarflexion left ankle intact.  Impression: Status post left total hip arthroplasty 10/06/2021  Plan: We will see her back in 6 months sooner if there is any questions concerns.  At that time we will obtain an AP pelvis and lateral view of the left hip.  She will follow-up sooner if she has any questions or concerns.  She will continue work on scar tissue mobilization.

## 2021-12-29 ENCOUNTER — Emergency Department (HOSPITAL_COMMUNITY)
Admission: EM | Admit: 2021-12-29 | Discharge: 2021-12-29 | Disposition: A | Discharge status: Home / Self Care | Payer: BC Managed Care – PPO | Attending: Emergency Medicine | Admitting: Emergency Medicine

## 2021-12-29 ENCOUNTER — Emergency Department (HOSPITAL_COMMUNITY): Payer: BC Managed Care – PPO

## 2021-12-29 ENCOUNTER — Encounter (HOSPITAL_COMMUNITY): Payer: Self-pay

## 2021-12-29 DIAGNOSIS — I10 Essential (primary) hypertension: Secondary | ICD-10-CM | POA: Insufficient documentation

## 2021-12-29 DIAGNOSIS — M25552 Pain in left hip: Secondary | ICD-10-CM | POA: Diagnosis present

## 2021-12-29 DIAGNOSIS — Z7982 Long term (current) use of aspirin: Secondary | ICD-10-CM | POA: Diagnosis not present

## 2021-12-29 DIAGNOSIS — Z79899 Other long term (current) drug therapy: Secondary | ICD-10-CM | POA: Diagnosis not present

## 2021-12-29 MED ORDER — OXYCODONE-ACETAMINOPHEN 5-325 MG PO TABS: 1.0000 | ORAL_TABLET | Freq: Three times a day (TID) | ORAL | 0 refills | Status: AC | PRN

## 2021-12-29 NOTE — ED Triage Notes (Signed)
Left hip pain since this morning hip sx 10/06/21 total hip replacement.  Skin warm and dry, pt has been going up and down steps recently

## 2021-12-29 NOTE — ED Provider Notes (Signed)
[facility] EMERGENCY DEPARTMENT Provider Note   CSN: [id number] Arrival date & time: 12/29/21  1006     History  No chief complaint on file.   Vicki [name] is a 56 y.o. female.  HPI  Patient with medical history including hypertension presents with complaints of left-sided hip pain.  Patient states she has been having intermittent hip pain since her's hip replacement, she states that it is generally a throbbing sensation will come and go generally worsen with ambulation, but she states starting this morning the pain has gotten a lot worse.  She states that she woke up this morning with worsening pain, she does note that she slept on it last night, pain remains mainly in her left hip, does not radiate, no associate paresthesia or weakness, denies any unilateral leg swelling, chest pain, shortness of breath, no history of PEs or DVTs currently on hormone therapy.  She states that she is not taking oxycodone as prescribed as well as taking over-the-counter pain medication not much relief.  She denies any alleviating factors.   After reviewing patient's chart patient was seen by Dr. Ninfa Linden where she had her hip replaced on 10/13, she was using follow-up with them on 12/14 everything appears to be healing well.  Home Medications Prior to Admission medications   Medication Sig Start Date End Date Taking? Authorizing Provider  oxyCODONE-acetaminophen (PERCOCET/ROXICET) 5-325 MG tablet Take 1 tablet by mouth every 8 (eight) hours as needed for up to 3 days for severe pain. 12/29/21 01/01/22 Yes Marcello Fennel, PA-C  amLODipine (NORVASC) 5 MG tablet Take 1 tablet (5 mg total) by mouth daily. 03/10/21   Noemi Chapel, MD  aspirin 81 MG chewable tablet Chew 1 tablet (81 mg total) by mouth 2 (two) times daily. 10/06/21   Mcarthur Rossetti, MD  carvedilol (COREG) 3.125 MG tablet Take 1 tablet (3.125 mg total) by mouth 2 (two) times daily with a meal. 08/25/20   Idol, Almyra Free, PA-C   cholecalciferol (VITAMIN D3) 25 MCG (1000 UNIT) tablet Take 1,000 Units by mouth daily.    [provider]  citalopram (CELEXA) 40 MG tablet Take 40 mg by mouth daily. 09/18/21   [provider]  diphenhydramine-acetaminophen (TYLENOL PM) 25-500 MG TABS tablet Take 1 tablet by mouth at bedtime.    [provider]  ibuprofen (ADVIL) 400 MG tablet Take 1 tablet (400 mg total) by mouth every 6 (six) hours as needed. Patient taking differently: Take 400 mg by mouth every 6 (six) hours as needed for mild pain. 08/25/20   Evalee Jefferson, PA-C  meclizine (ANTIVERT) 25 MG tablet Take 1 tablet (25 mg total) by mouth 3 (three) times daily as needed for dizziness. 11/09/18   Nat Christen, MD  methocarbamol (ROBAXIN) 500 MG tablet Take 1 tablet (500 mg total) by mouth every 6 (six) hours as needed for muscle spasms. 10/06/21   Mcarthur Rossetti, MD  oxyCODONE (OXY IR/ROXICODONE) 5 MG immediate release tablet Take 1-2 tablets (5-10 mg total) by mouth every 6 (six) hours as needed for moderate pain (pain score 4-6). 10/20/21   Mcarthur Rossetti, MD      Allergies    Patient has no known allergies.    Review of Systems   Review of Systems  Constitutional:  Negative for chills and fever.  HENT:  Negative for congestion.   Respiratory:  Negative for shortness of breath.   Cardiovascular:  Negative for chest pain.  Gastrointestinal:  Negative for abdominal pain.  Genitourinary:  Negative for enuresis.  Musculoskeletal:        Left hip pain.  Skin:  Negative for rash.  Neurological:  Negative for dizziness.   Physical Exam Updated Vital Signs BP (!) 169/93 (BP Location: Right Arm)    Pulse 85    Temp 98.5 F (36.9 C) (Oral)    Resp 16    Ht 5\' 7"  (1.702 m)    Wt 70.3 kg    LMP 02/09/2014    SpO2 99%    BMI 24.28 kg/m  Physical Exam Vitals and nursing note reviewed.  Constitutional:      General: She is not in acute distress.    Appearance: She is not ill-appearing.   HENT:     Head: Normocephalic and atraumatic.     Nose: No congestion.  Eyes:     Conjunctiva/sclera: Conjunctivae normal.  Cardiovascular:     Rate and Rhythm: Normal rate and regular rhythm.     Pulses: Normal pulses.     Heart sounds: No murmur heard.   No friction rub. No gallop.  Pulmonary:     Effort: No respiratory distress.     Breath sounds: No wheezing, rhonchi or rales.  Musculoskeletal:     Right lower leg: No edema.     Left lower leg: No edema.     Comments: No pelvic instability, no leg shortening, she has full range of motion in her ankles knee and hip bilaterally, neurovascular fully intact.  There is no overlying skin changes present, she does have a noted surgical scar which appears to be healing well no sign infection noted.  The slight tender to palpation along her left trochanter no gross hemorrhage present.  Spine was palpated was nontender to palpation, no step-off deformities noted.  Skin:    General: Skin is warm and dry.  Neurological:     Mental Status: She is alert.  Psychiatric:        Mood and Affect: Mood normal.    ED Results / Procedures / Treatments   Labs (all labs ordered are listed, but only abnormal results are displayed) Labs Reviewed - No data to display  EKG None  Radiology DG Hip Unilat W or Wo Pelvis 2-3 Views Left  Result Date: 12/29/2021 CLINICAL DATA:  Prior left hip replacement EXAM: DG HIP (WITH OR WITHOUT PELVIS) 2-3V LEFT COMPARISON:  Radiograph dated October 06, 2021 FINDINGS: Prior total left hip replacement. No periprostatic lucency or fracture. Hardware is intact. Mild degenerative changes of the pubic symphysis. Single AP view of the right hip is unremarkable. Soft tissues are unremarkable. IMPRESSION: Prior total left hip replacement with no evidence of hardware complication. Electronically Signed   By: Yetta Glassman M.D.   On: 12/29/2021 11:37    Procedures Procedures    Medications Ordered in ED Medications - No  data to display  ED Course/ Medical Decision Making/ A&P                           Medical Decision Making  This patient presents to the ED for concern of left hip pain, this involves an extensive number of treatment options, and is a complaint that carries with it a high risk of complications and morbidity.  The differential diagnosis includes septic arthritis, hardware complications    Additional history obtained:  Additional history obtained from electronic medical records External records from outside source obtained and reviewed including Dr. Ninfa Linden prior discharge  Co morbidities that complicate the patient evaluation  N/A  Social Determinants of Health:  N/A    Lab Tests:  I Ordered, and personally interpreted labs.  The pertinent results include: N/A   Imaging Studies ordered:  I ordered imaging studies including unilateral left hip I independently visualized and interpreted imaging which showed negative for acute abnormalities I agree with the radiologist interpretation   Cardiac Monitoring:  The patient was maintained on a cardiac monitor.  I personally viewed and interpreted the cardiac monitored which showed an underlying rhythm of: N/A   Test Considered:  Considered CBC and BMP but deferred as I very low suspicion for infectious this time    Rule out I have low suspicion for septic arthritis as patient denies IV drug use, skin exam was performed no erythematous, edematous, warm joints noted on exam, no new heart murmur heard on exam.  Low suspicion for fracture or dislocation as x-ray does not feel any significant findings. low suspicion for tendon damage as area was palpated no gross defects noted, they had full range of motion as well as 5/5 strength.  Low suspicion for compartment syndrome as area was palpated it was soft to the touch, neurovascular fully intact.  I have low suspicion for DVT as she has no calf tenderness, no unilateral leg  swelling present.  Pain is on the anterior aspect of her knee.  Dispostion:  After consideration of the diagnostic results and the patients response to treatment, I feel that the patent would benefit from short course of narcotic medication, will have him continue with NSAIDs and Tylenol, follow-up with her PCP and/or orthopedic surgery for further evaluation symptoms persist..   Problem List / ED Course:  Left-sided hip pain          Final Clinical Impression(s) / ED Diagnoses Final diagnoses:  Left hip pain    Rx / DC Orders ED Discharge Orders          Ordered    oxyCODONE-acetaminophen (PERCOCET/ROXICET) 5-325 MG tablet  Every 8 hours PRN        12/29/21 [place], Daimen Shovlin J, PA-C 12/29/21 1152    Hayden Rasmussen, MD 12/29/21 1941

## 2021-12-29 NOTE — Discharge Instructions (Addendum)
Likely her pain is from your recent surgery, please continue with over-the-counter pain medication as needed.  Recommend playing ice to the area or heat to the areas as help with pain and inflammation.  I have given you a short course of narcotics please take as prescribed.  This medication can make you drowsy do not consume alcohol or operate heavy machinery when taking this medication.  This medication is Tylenol in it do not take Tylenol and take this medication. .  If pain persist please follow-up with your PCP and/or Dr. Ninfa Linden for further evaluation.  Come back to the emergency department if you develop chest pain, shortness of breath, severe abdominal pain, uncontrolled nausea, vomiting, diarrhea.

## 2022-02-01 ENCOUNTER — Encounter: Payer: Self-pay | Admitting: *Deleted

## 2022-02-22 ENCOUNTER — Other Ambulatory Visit (HOSPITAL_COMMUNITY): Payer: Self-pay | Admitting: Nurse Practitioner

## 2022-02-22 DIAGNOSIS — Z1231 Encounter for screening mammogram for malignant neoplasm of breast: Secondary | ICD-10-CM

## 2022-02-23 DIAGNOSIS — F320 Major depressive disorder, single episode, mild: Secondary | ICD-10-CM | POA: Insufficient documentation

## 2022-04-04 ENCOUNTER — Ambulatory Visit: Payer: BC Managed Care – PPO

## 2022-04-04 ENCOUNTER — Telehealth: Payer: Self-pay | Admitting: *Deleted

## 2022-04-04 NOTE — Telephone Encounter (Signed)
Pt was scheduled for 4:00 nurse visit by phone.  Tried to call pt x 2 times.  Had to leave a voice mail for pt. ?

## 2022-05-04 ENCOUNTER — Emergency Department (HOSPITAL_COMMUNITY)
Admission: EM | Admit: 2022-05-04 | Discharge: 2022-05-04 | Disposition: A | Discharge status: Home / Self Care | Payer: BC Managed Care – PPO | Attending: Emergency Medicine | Admitting: Emergency Medicine

## 2022-05-04 ENCOUNTER — Emergency Department (HOSPITAL_COMMUNITY): Payer: BC Managed Care – PPO

## 2022-05-04 ENCOUNTER — Other Ambulatory Visit: Payer: Self-pay

## 2022-05-04 ENCOUNTER — Encounter (HOSPITAL_COMMUNITY): Payer: Self-pay | Admitting: *Deleted

## 2022-05-04 DIAGNOSIS — Z7982 Long term (current) use of aspirin: Secondary | ICD-10-CM | POA: Diagnosis not present

## 2022-05-04 DIAGNOSIS — R072 Precordial pain: Secondary | ICD-10-CM | POA: Diagnosis present

## 2022-05-04 DIAGNOSIS — Z79899 Other long term (current) drug therapy: Secondary | ICD-10-CM | POA: Insufficient documentation

## 2022-05-04 DIAGNOSIS — I10 Essential (primary) hypertension: Secondary | ICD-10-CM | POA: Diagnosis not present

## 2022-05-04 DIAGNOSIS — R42 Dizziness and giddiness: Secondary | ICD-10-CM | POA: Diagnosis not present

## 2022-05-04 LAB — BASIC METABOLIC PANEL
Anion gap: 8 (ref 5–15)
BUN: 15 mg/dL (ref 6–20)
CO2: 23 mmol/L (ref 22–32)
Calcium: 9 mg/dL (ref 8.9–10.3)
Chloride: 108 mmol/L (ref 98–111)
Creatinine, Ser: 0.99 mg/dL (ref 0.44–1.00)
GFR, Estimated: >60 mL/min (ref >60)
Glucose, Bld: 103 mg/dL — ABNORMAL HIGH (ref 70–99)
Potassium: 3.7 mmol/L (ref 3.5–5.1)
Sodium: 139 mmol/L (ref 135–145)

## 2022-05-04 LAB — CBC WITH DIFFERENTIAL/PLATELET
Abs Immature Granulocytes: 0.01 10*3/uL (ref 0.00–0.07)
Basophils Absolute: 0.1 10*3/uL (ref 0.0–0.1)
Basophils Relative: 1 %
Eosinophils Absolute: 0.2 10*3/uL (ref 0.0–0.5)
Eosinophils Relative: 3 %
HCT: 40.4 % (ref 36.0–46.0)
Hemoglobin: 13.7 g/dL (ref 12.0–15.0)
Immature Granulocytes: 0 %
Lymphocytes Relative: 35 %
Lymphs Abs: 2.1 10*3/uL (ref 0.7–4.0)
MCH: 28.2 pg (ref 26.0–34.0)
MCHC: 33.9 g/dL (ref 30.0–36.0)
MCV: 83.3 fL (ref 80.0–100.0)
Monocytes Absolute: 0.5 10*3/uL (ref 0.1–1.0)
Monocytes Relative: 8 %
Neutro Abs: 3.2 10*3/uL (ref 1.7–7.7)
Neutrophils Relative %: 53 %
Platelets: 224 10*3/uL (ref 150–400)
RBC: 4.85 MIL/uL (ref 3.87–5.11)
RDW: 14.7 % (ref 11.5–15.5)
WBC: 6 10*3/uL (ref 4.0–10.5)
nRBC: 0 % (ref 0.0–0.2)

## 2022-05-04 LAB — D-DIMER, QUANTITATIVE: D-Dimer, Quant: 0.63 ug/mL-FEU — ABNORMAL HIGH (ref 0.00–0.50)

## 2022-05-04 LAB — TROPONIN I (HIGH SENSITIVITY)
Troponin I (High Sensitivity): <2 ng/L (ref <18)
Troponin I (High Sensitivity): <2 ng/L (ref <18)

## 2022-05-04 MED ORDER — IOHEXOL 350 MG/ML SOLN
80.0000 mL | Freq: Once | INTRAVENOUS | Status: AC | PRN
  Administered 2022-05-04: 80 mL via INTRAVENOUS

## 2022-05-04 NOTE — ED Triage Notes (Signed)
Pt c/o mid chest pain that started last night. Pt also c/o dizziness and SOB with walking x few days. Denies n/v.  ?

## 2022-05-04 NOTE — ED Notes (Signed)
Patient transported to CT 

## 2022-05-04 NOTE — ED Provider Notes (Signed)
?[facility] ?Provider Note ? ? ?CSN: [id number] ?Arrival date & time: 05/04/22  1962 ? ?  ? ?History ? ?Chief Complaint  ?Patient presents with  ? Chest Pain  ? ? ?Vicki [name] is a 56 y.o. female. ? ?Patient with onset of substernal chest pain at 1 this morning that has been constant.  Nonradiating.  Not associated with nausea vomiting or diarrhea.  Patient's had some leg swelling for the past 2 weeks that has been bilateral.  No leg pain.  Also exertional shortness of breath for 2 weeks. ? ?Past medical history significant for hypertension and patient states gastroesophageal reflux disease.  Patient still taking baby aspirin a day.  She is on Norvasc.  Also has had an abdominal hysterectomy. ? ? ?  ? ?Home Medications ?Prior to Admission medications   ?Medication Sig Start Date End Date Taking? Authorizing Provider  ?amLODipine (NORVASC) 5 MG tablet Take 1 tablet (5 mg total) by mouth daily. 03/10/21  Yes Noemi Chapel, MD  ?aspirin 81 MG chewable tablet Chew 1 tablet (81 mg total) by mouth 2 (two) times daily. 10/06/21  Yes Mcarthur Rossetti, MD  ?carvedilol (COREG) 3.125 MG tablet Take 1 tablet (3.125 mg total) by mouth 2 (two) times daily with a meal. 08/25/20  Yes Idol, Almyra Free, PA-C  ?cholecalciferol (VITAMIN D3) 25 MCG (1000 UNIT) tablet Take 1,000 Units by mouth daily.   Yes [provider]  ?citalopram (CELEXA) 40 MG tablet Take 40 mg by mouth daily. 09/18/21  Yes [provider]  ?hydrOXYzine (ATARAX) 25 MG tablet Take 25 mg by mouth 3 (three) times daily as needed for anxiety. 04/27/22  Yes [provider]  ?simvastatin (ZOCOR) 20 MG tablet Take 20 mg by mouth daily. 03/16/22  Yes [provider]  ?ibuprofen (ADVIL) 400 MG tablet Take 1 tablet (400 mg total) by mouth every 6 (six) hours as needed. ?Patient not taking: Reported on 05/04/2022 08/25/20   Evalee Jefferson, PA-C  ?meclizine (ANTIVERT) 25 MG tablet Take 1 tablet (25 mg total) by mouth 3 (three)  times daily as needed for dizziness. ?Patient not taking: Reported on 05/04/2022 11/09/18   Nat Christen, MD  ?methocarbamol (ROBAXIN) 500 MG tablet Take 1 tablet (500 mg total) by mouth every 6 (six) hours as needed for muscle spasms. ?Patient not taking: Reported on 05/04/2022 10/06/21   Mcarthur Rossetti, MD  ?oxyCODONE (OXY IR/ROXICODONE) 5 MG immediate release tablet Take 1-2 tablets (5-10 mg total) by mouth every 6 (six) hours as needed for moderate pain (pain score 4-6). ?Patient not taking: Reported on 05/04/2022 10/20/21   Mcarthur Rossetti, MD  ?   ? ?Allergies    ?Patient has no known allergies.   ? ?Review of Systems   ?Review of Systems  ?Constitutional:  Negative for chills and fever.  ?HENT:  Negative for ear pain and sore throat.   ?Eyes:  Negative for pain and visual disturbance.  ?Respiratory:  Positive for shortness of breath. Negative for cough.   ?Cardiovascular:  Positive for chest pain and leg swelling. Negative for palpitations.  ?Gastrointestinal:  Negative for abdominal pain, diarrhea, nausea and vomiting.  ?Genitourinary:  Negative for dysuria and hematuria.  ?Musculoskeletal:  Negative for arthralgias and back pain.  ?Skin:  Negative for color change and rash.  ?Neurological:  Positive for dizziness. Negative for seizures and syncope.  ?All other systems reviewed and are negative. ? ?Physical Exam ?Updated Vital Signs ?BP 140/84   Pulse 63  Temp (!) 97.5 ?F (36.4 ?C) (Oral)   Resp 18   Ht 1.702 m ('5\' 7"'$ )   Wt 74.8 kg   LMP 02/09/2014   SpO2 98%   BMI 25.84 kg/m?  ?Physical Exam ?Vitals and nursing note reviewed.  ?Constitutional:   ?   General: She is not in acute distress. ?   Appearance: She is well-developed. She is not toxic-appearing.  ?HENT:  ?   Head: Normocephalic and atraumatic.  ?Eyes:  ?   Extraocular Movements: Extraocular movements intact.  ?   Conjunctiva/sclera: Conjunctivae normal.  ?   Pupils: Pupils are equal, round, and reactive to light.   ?Cardiovascular:  ?   Rate and Rhythm: Normal rate and regular rhythm.  ?   Heart sounds: No murmur heard. ?Pulmonary:  ?   Effort: Pulmonary effort is normal. No tachypnea or respiratory distress.  ?   Breath sounds: Normal breath sounds. No decreased breath sounds, wheezing, rhonchi or rales.  ?Chest:  ?   Chest wall: No tenderness.  ?Abdominal:  ?   Palpations: Abdomen is soft.  ?   Tenderness: There is no abdominal tenderness.  ?Musculoskeletal:     ?   General: No swelling.  ?   Cervical back: Normal range of motion and neck supple.  ?   Right lower leg: No tenderness. No edema.  ?   Left lower leg: No tenderness. Edema present.  ?   Comments: Trace edema to the left lower extremity  ?Skin: ?   General: Skin is warm and dry.  ?   Capillary Refill: Capillary refill takes less than 2 seconds.  ?Neurological:  ?   General: No focal deficit present.  ?   Mental Status: She is alert.  ?Psychiatric:     ?   Mood and Affect: Mood normal.  ? ? ?ED Results / Procedures / Treatments   ?Labs ?(all labs ordered are listed, but only abnormal results are displayed) ?Labs Reviewed  ?BASIC METABOLIC PANEL - Abnormal; Notable for the following components:  ?    Result Value  ? Glucose, Bld 103 (*)   ? All other components within normal limits  ?D-DIMER, QUANTITATIVE - Abnormal; Notable for the following components:  ? D-Dimer, Quant 0.63 (*)   ? All other components within normal limits  ?CBC WITH DIFFERENTIAL/PLATELET  ?TROPONIN I (HIGH SENSITIVITY)  ?TROPONIN I (HIGH SENSITIVITY)  ? ? ?EKG ?EKG Interpretation ? ?Date/Time:  Thursday May 04 2022 07:24:27 EDT ?Ventricular Rate:  68 ?PR Interval:  142 ?QRS Duration: 80 ?QT Interval:  456 ?QTC Calculation: 485 ?R Axis:   44 ?Text Interpretation: Sinus rhythm Confirmed by Fredia Sorrow [phone]) on 05/04/2022 7:32:35 AM ? ?Radiology ?CT Angio Chest PE W/Cm &/Or Wo Cm ? ?Result Date: 05/04/2022 ?CLINICAL DATA:  Rule out pulmonary embolus. Chest pain, shortness of breath and  positive D-dimer. EXAM: CT ANGIOGRAPHY CHEST WITH CONTRAST TECHNIQUE: Multidetector CT imaging of the chest was performed using the standard protocol during bolus administration of intravenous contrast. Multiplanar CT image reconstructions and MIPs were obtained to evaluate the vascular anatomy. RADIATION DOSE REDUCTION: This exam was performed according to the departmental dose-optimization program which includes automated exposure control, adjustment of the mA and/or kV according to patient size and/or use of iterative reconstruction technique. CONTRAST:  12m OMNIPAQUE IOHEXOL 350 MG/ML SOLN COMPARISON:  CT AP 04/18/2013. FINDINGS: Cardiovascular: Satisfactory opacification of the pulmonary arteries to the segmental level. No evidence of pulmonary embolism. Normal heart size. No pericardial effusion. Mediastinum/Nodes:  No enlarged mediastinal, hilar, or axillary lymph nodes. Thyroid gland, trachea, and esophagus demonstrate no significant findings. Lungs/Pleura: No pleural effusion. Subpleural nodule identified within the periphery of the left lower lobe measuring 4 mm. This is unchanged when compared with remote CT from 04/18/2013 compatible with a benign abnormality.No airspace consolidation, atelectasis or signs of pneumothorax. Subpleural nodule within the anterior right middle lobe measures 5 mm. This area was not included on the CT from 04/18/2013. Subpleural nodule in the anterior right lower lobe measures 3 mm and was also not included on the prior CT. Upper Abdomen: No acute findings. Too small to characterize right lobe of liver lesion measures 5 mm. Musculoskeletal: No chest wall abnormality. No acute or significant osseous findings. Review of the MIP images confirms the above findings. IMPRESSION: 1. No evidence for acute pulmonary embolus. 2. Small subpleural nodules within the right middle lobe and right lower lobe are noted measuring up to 5 mm. No routine follow-up imaging is recommended per  Fleischner Society Guidelines. Electronically Signed   By: Kerby Moors M.D.   On: 05/04/2022 09:12  ? ?DG Chest Port 1 View ? ?Result Date: 05/04/2022 ?CLINICAL DATA:  Chest pain, shortness of breath EXAM: PORTABLE CHEST 1 VIEW COMPA

## 2022-05-04 NOTE — Discharge Instructions (Addendum)
Work-up for the chest pain and shortness of breath without any acute findings.  Cardiac markers called troponins both very normal.  CT of the chest showed no evidence of a blood clot.  Make an appointment to follow-up with your regular doctor and also referral information provided to cardiology to follow-up with cardiology for consideration for additional work-up of the chest pain.  Work note provided. ?

## 2022-06-07 ENCOUNTER — Ambulatory Visit: Payer: Self-pay | Admitting: Orthopaedic Surgery

## 2022-07-06 ENCOUNTER — Ambulatory Visit: Payer: Self-pay | Admitting: Orthopaedic Surgery

## 2023-02-01 DIAGNOSIS — M545 Low back pain, unspecified: Secondary | ICD-10-CM | POA: Insufficient documentation

## 2023-02-22 ENCOUNTER — Encounter: Payer: Self-pay | Admitting: Radiology

## 2023-04-03 ENCOUNTER — Encounter (HOSPITAL_COMMUNITY): Payer: Self-pay

## 2023-04-03 ENCOUNTER — Other Ambulatory Visit: Payer: Self-pay

## 2023-04-03 ENCOUNTER — Emergency Department (HOSPITAL_COMMUNITY): Payer: Self-pay

## 2023-04-03 ENCOUNTER — Emergency Department (HOSPITAL_COMMUNITY)
Admission: EM | Admit: 2023-04-03 | Discharge: 2023-04-03 | Disposition: A | Discharge status: Home / Self Care | Payer: Self-pay | Attending: Emergency Medicine | Admitting: Emergency Medicine

## 2023-04-03 DIAGNOSIS — M25551 Pain in right hip: Secondary | ICD-10-CM | POA: Insufficient documentation

## 2023-04-03 DIAGNOSIS — Z96642 Presence of left artificial hip joint: Secondary | ICD-10-CM | POA: Insufficient documentation

## 2023-04-03 DIAGNOSIS — Z79899 Other long term (current) drug therapy: Secondary | ICD-10-CM | POA: Insufficient documentation

## 2023-04-03 DIAGNOSIS — I10 Essential (primary) hypertension: Secondary | ICD-10-CM | POA: Insufficient documentation

## 2023-04-03 DIAGNOSIS — Z7982 Long term (current) use of aspirin: Secondary | ICD-10-CM | POA: Insufficient documentation

## 2023-04-03 DIAGNOSIS — M25552 Pain in left hip: Secondary | ICD-10-CM | POA: Insufficient documentation

## 2023-04-03 MED ORDER — OXYCODONE HCL 5 MG PO TABS
5.0000 mg | ORAL_TABLET | Freq: Four times a day (QID) | ORAL | 0 refills | Status: DC | PRN
Start: 2023-04-03 — End: 2023-05-10

## 2023-04-03 MED ORDER — IBUPROFEN 600 MG PO TABS: 600.0000 mg | ORAL_TABLET | Freq: Four times a day (QID) | ORAL | 0 refills | Status: DC | PRN

## 2023-04-03 MED ORDER — OXYCODONE-ACETAMINOPHEN 5-325 MG PO TABS
2.0000 | ORAL_TABLET | Freq: Once | ORAL | Status: AC
  Administered 2023-04-03: 2 via ORAL
  Filled 2023-04-03: qty 2

## 2023-04-03 MED ORDER — ACETAMINOPHEN 325 MG PO TABS: 650.0000 mg | ORAL_TABLET | Freq: Four times a day (QID) | ORAL | 0 refills | Status: DC | PRN

## 2023-04-03 MED ORDER — IBUPROFEN 800 MG PO TABS
800.0000 mg | ORAL_TABLET | Freq: Once | ORAL | Status: AC
  Administered 2023-04-03: 800 mg via ORAL
  Filled 2023-04-03: qty 1

## 2023-04-03 MED ORDER — METHOCARBAMOL 1000 MG PO TABS: 1000.0000 mg | ORAL_TABLET | Freq: Two times a day (BID) | ORAL | 0 refills | Status: AC

## 2023-04-03 NOTE — Discharge Instructions (Addendum)
It was a pleasure caring for you today in the emergency department.  Please return to the emergency department for any worsening or worrisome symptoms.  Please follow-up with your orthopedic specialist  Please take ibuprofen/motrin/advil with FOOD to avoid stomach upset  Start with advil/tylenol as scheduled then if pain persists you may add oxycodone(roxicodone) as scheduled  Return if symptoms worsen or do not improve, please follow up with Dr Romeo Apple

## 2023-04-03 NOTE — ED Triage Notes (Addendum)
Pt is complaining of bilateral hip pain that has been going on for a few weeks but has gotten worse today after she was lifting heavy boxes at work yesterday. Pt already has had left hip operated on and is waiting to operate on the right.  Pt ambulatory to room.

## 2023-04-03 NOTE — ED Notes (Signed)
Patient transported to X-ray 

## 2023-04-03 NOTE — ED Provider Notes (Signed)
[place] EMERGENCY DEPARTMENT AT [facility] Provider Note  CSN: [id number] Arrival date & time: 04/03/23 4166  Chief Complaint(s) Hip Pain  HPI Vicki [name] is a 57 y.o. female with past medical history as below, significant for left total hip, hypertension, abdominal hysterectomy, avascular necrosis who presents to the ED with complaint of bilateral hip pain.  Patient reports lifting heavy box yesterday, began having pain to her bilateral hips, right greater than left.  Described as aching, occasionally stabbing sensation.  No numbness or tingling to her extremities.  No falls.  Took Tylenol at home without much improvement.  Follows with Dr. Romeo Apple orthopedics.  No fevers, no IV drug use, no recent injections no abdominal pain, no back pain  Past Medical History Past Medical History:  Diagnosis Date   Abnormal Pap smear    Anemia    Anxiety 2020   BV (bacterial vaginosis) 05/16/2013   Depression 2020   Dysrhythmia    Fibroids    uterine   Hypertension    Mental disorder    depression and panic attacks   Tachycardia 2018   pt states for the past 4 years she has went "in and out" of tachycardia   Patient Active Problem List   Diagnosis Date Noted   Avascular necrosis of hip, left 10/06/2021   Status post left hip replacement 10/06/2021   S/P abdominal hysterectomy 02/25/2014   Depression 07/25/2013   Panic attacks 07/25/2013   Fibroids 05/16/2013   BV (bacterial vaginosis) 05/16/2013   Anemia 04/03/2012   Menorrhagia 04/03/2012   Hypokalemia 04/03/2012   UTI (lower urinary tract infection) 04/03/2012   Emesis 04/03/2012   Home Medication(s) Prior to Admission medications   Medication Sig Start Date End Date Taking? Authorizing Provider  amLODipine (NORVASC) 5 MG tablet Take 1 tablet (5 mg total) by mouth daily. 03/10/21   Eber Hong, MD  aspirin 81 MG chewable tablet Chew 1 tablet (81 mg total) by mouth 2 (two) times daily. 10/06/21   Kathryne Hitch, MD  carvedilol (COREG) 3.125 MG tablet Take 1 tablet (3.125 mg total) by mouth 2 (two) times daily with a meal. 08/25/20   Idol, Raynelle Fanning, PA-C  cholecalciferol (VITAMIN D3) 25 MCG (1000 UNIT) tablet Take 1,000 Units by mouth daily.    [provider]  citalopram (CELEXA) 40 MG tablet Take 40 mg by mouth daily. 09/18/21   [provider]  hydrOXYzine (ATARAX) 25 MG tablet Take 25 mg by mouth 3 (three) times daily as needed for anxiety. 04/27/22   [provider]  ibuprofen (ADVIL) 400 MG tablet Take 1 tablet (400 mg total) by mouth every 6 (six) hours as needed. Patient not taking: Reported on 05/04/2022 08/25/20   Burgess Amor, PA-C  meclizine (ANTIVERT) 25 MG tablet Take 1 tablet (25 mg total) by mouth 3 (three) times daily as needed for dizziness. Patient not taking: Reported on 05/04/2022 11/09/18   Donnetta Hutching, MD  methocarbamol (ROBAXIN) 500 MG tablet Take 1 tablet (500 mg total) by mouth every 6 (six) hours as needed for muscle spasms. Patient not taking: Reported on 05/04/2022 10/06/21   Kathryne Hitch, MD  oxyCODONE (OXY IR/ROXICODONE) 5 MG immediate release tablet Take 1-2 tablets (5-10 mg total) by mouth every 6 (six) hours as needed for moderate pain (pain score 4-6). Patient not taking: Reported on 05/04/2022 10/20/21   Kathryne Hitch, MD  simvastatin (ZOCOR) 20 MG tablet Take 20 mg by mouth daily. 03/16/22  [provider]                                                                                                                                    Past Surgical History Past Surgical History:  Procedure Laterality Date   ABDOMINAL HYSTERECTOMY     BILATERAL SALPINGECTOMY Bilateral 02/25/2014   Procedure: BILATERAL SALPINGECTOMY;  Surgeon: Lazaro Arms, MD;  Location: [facility];  Service: Gynecology;  Laterality: Bilateral;   DILATION AND CURETTAGE OF UTERUS     SUPRACERVICAL ABDOMINAL HYSTERECTOMY N/A 02/25/2014   Procedure:  HYSTERECTOMY SUPRACERVICAL ABDOMINAL;  Surgeon: Lazaro Arms, MD;  Location: [facility];  Service: Gynecology;  Laterality: N/A;   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   Procedure: LEFT TOTAL HIP ARTHROPLASTY ANTERIOR APPROACH;  Surgeon: Kathryne Hitch, MD;  Location: [facility];  Service: Orthopedics;  Laterality: Left;   TUBAL LIGATION     Family History Family History  Problem Relation Age of Onset   Congestive Heart Failure Mother    Congestive Heart Failure Maternal Grandmother    Cancer Paternal Grandmother        ovarian    Social History Social History   Tobacco Use   Smoking status: Never   Smokeless tobacco: Never  Vaping Use   Vaping Use: Never used  Substance Use Topics   Alcohol use: Yes    Comment: 1 beer daily    Drug use: No   Allergies Patient has no known allergies.  Review of Systems Review of Systems  Constitutional:  Negative for activity change and fever.  HENT:  Negative for facial swelling and trouble swallowing.   Eyes:  Negative for discharge and redness.  Respiratory:  Negative for cough and shortness of breath.   Cardiovascular:  Negative for chest pain and palpitations.  Gastrointestinal:  Negative for abdominal pain and nausea.  Genitourinary:  Negative for dysuria and flank pain.  Musculoskeletal:  Positive for arthralgias. Negative for back pain and gait problem.  Skin:  Negative for pallor and rash.  Neurological:  Negative for syncope and headaches.    Physical Exam Vital Signs  I have reviewed the triage vital signs BP 131/80 (BP Location: Right Arm)   Pulse 69   Temp 98.1 F (36.7 C) (Oral)   Resp 18   Ht 5\' 7"  (1.702 m)   Wt 74.8 kg   LMP 02/09/2014   SpO2 100%   BMI 25.83 kg/m  Physical Exam Vitals and nursing note reviewed.  Constitutional:      General: She is not in acute distress.    Appearance: Normal appearance.  HENT:     Head: Normocephalic and atraumatic.     Right Ear: External ear normal.     Left  Ear: External ear normal.     Nose: Nose normal.     Mouth/Throat:     Mouth: Mucous membranes are moist.  Eyes:  General: No scleral icterus.       Right eye: No discharge.        Left eye: No discharge.  Cardiovascular:     Rate and Rhythm: Normal rate.     Pulses: Normal pulses.  Pulmonary:     Effort: Pulmonary effort is normal. No respiratory distress.     Breath sounds: No stridor.  Abdominal:     General: Abdomen is flat.     Tenderness: There is no abdominal tenderness.  Musculoskeletal:     Right lower leg: No edema.     Left lower leg: No edema.     Comments: Pain to acetabulum b/l No external signs of trauma Strength 5/5 BLLE NVI BLLE No pain to b/l knee / ankle w/ palpation or ROM  Skin:    General: Skin is warm and dry.     Capillary Refill: Capillary refill takes less than 2 seconds.  Neurological:     Mental Status: She is alert and oriented to person, place, and time.     GCS: GCS eye subscore is 4. GCS verbal subscore is 5. GCS motor subscore is 6.  Psychiatric:        Mood and Affect: Mood normal.        Behavior: Behavior normal.     ED Results and Treatments Labs (all labs ordered are listed, but only abnormal results are displayed) Labs Reviewed - No data to display                                                                                                                        Radiology DG Hips Bilat W or Wo Pelvis 3-4 Views  Result Date: 04/03/2023 CLINICAL DATA:  Evaluate for fracture. Complains of bilateral hip pain for several weeks which is gotten progressively worse today after lifting heavy boxes at work. EXAM: DG HIP (WITH OR WITHOUT PELVIS) 3-4V BILAT COMPARISON:  12/29/2021 FINDINGS: Status post left total hip arthroplasty. The hardware components are in anatomic alignment without signs of periprosthetic fracture or dislocation. Right hip appears intact. Degenerative changes are identified at the pubic symphysis. No signs of pelvic  fracture or diastasis. IMPRESSION: 1. No acute findings.  No signs of fracture or dislocation. 2. Status post left hip arthroplasty. 3. Degenerative changes identified at the pubic symphysis. Electronically Signed   By: Signa Kell M.D.   On: 04/03/2023 07:28    Pertinent labs & imaging results that were available during my care of the patient were reviewed by me and considered in my medical decision making (see MDM for details).  Medications Ordered in ED Medications  oxyCODONE-acetaminophen (PERCOCET/ROXICET) 5-325 MG per tablet 2 tablet (2 tablets Oral Given 04/03/23 0637)  ibuprofen (ADVIL) tablet 800 mg (800 mg Oral Given 04/03/23 0637)  Procedures Procedures  (including critical care time)  Medical Decision Making / ED Course    Medical Decision Making:    Margarita RanaGwendy L Aye is a 57 y.o. female with past medical history as below, significant for left total hip, hypertension, abdominal hysterectomy, avascular necrosis who presents to the ED with complaint of bilateral hip pain.. The complaint involves an extensive differential diagnosis and also carries with it a high risk of complications and morbidity.  Serious etiology was considered. Ddx includes but is not limited to: Sprain, strain, soft tissue, MSK, fracture, less likely septic arthritis  Complete initial physical exam performed, notably the patient  was no acute distress, ambulatory to the room.    Reviewed and confirmed nursing documentation for past medical history, family history, social history.  Vital signs reviewed.      X-ray reviewed, degenerative changes noted, no fracture Physical exam is reassuring, lower extremities are neurovascularly intact bilateral No low back pain or abdominal pain Favor likely MSK, discussed supportive care at home, work restrictions were also discussed follow-up  with orthopedics Symptoms improved after oral analgesics here  The patient improved significantly and was discharged in stable condition. Detailed discussions were had with the patient regarding current findings, and need for close f/u with PCP or on call doctor. The patient has been instructed to return immediately if the symptoms worsen in any way for re-evaluation. Patient verbalized understanding and is in agreement with current care plan. All questions answered prior to discharge.    Additional history obtained: -Additional history obtained from na -External records from outside source obtained and reviewed including: Chart review including previous notes, labs, imaging, consultation notes including prior ED visits, home medications, prior surgical notes, PT notes   Lab Tests: na  EKG   EKG Interpretation  Date/Time:    Ventricular Rate:    PR Interval:    QRS Duration:   QT Interval:    QTC Calculation:   R Axis:     Text Interpretation:           Imaging Studies ordered: I ordered imaging studies including xr I independently visualized the following imaging with scope of interpretation limited to determining acute life threatening conditions related to emergency care; findings noted above, significant for chronic changes I independently visualized and interpreted imaging. I agree with the radiologist interpretation   Medicines ordered and prescription drug management: Meds ordered this encounter  Medications   oxyCODONE-acetaminophen (PERCOCET/ROXICET) 5-325 MG per tablet 2 tablet   ibuprofen (ADVIL) tablet 800 mg    -I have reviewed the patients home medicines and have made adjustments as needed   Consultations Obtained: na   Cardiac Monitoring: na  Social Determinants of Health:  na   Reevaluation: After the interventions noted above, I reevaluated the patient and found that they have improved  Co morbidities that complicate the patient  evaluation  Past Medical History:  Diagnosis Date   Abnormal Pap smear    Anemia    Anxiety 2020   BV (bacterial vaginosis) 05/16/2013   Depression 2020   Dysrhythmia    Fibroids    uterine   Hypertension    Mental disorder    depression and panic attacks   Tachycardia 2018   pt states for the past 4 years she has went "in and out" of tachycardia      Dispostion: Disposition decision including need for hospitalization was considered, and patient discharged from emergency department.    Final Clinical Impression(s) / ED Diagnoses Final  diagnoses:  None     This chart was dictated using voice recognition software.  Despite best efforts to proofread,  errors can occur which can change the documentation meaning.    Sloan Leiter, DO 04/03/23 [phone]

## 2023-04-03 NOTE — ED Notes (Signed)
Pt returned to treatment room from xray

## 2023-05-10 ENCOUNTER — Emergency Department (HOSPITAL_COMMUNITY)
Admission: EM | Admit: 2023-05-10 | Discharge: 2023-05-10 | Disposition: A | Discharge status: Home / Self Care | Payer: Self-pay | Attending: Emergency Medicine | Admitting: Emergency Medicine

## 2023-05-10 DIAGNOSIS — Z7982 Long term (current) use of aspirin: Secondary | ICD-10-CM | POA: Insufficient documentation

## 2023-05-10 DIAGNOSIS — Z79899 Other long term (current) drug therapy: Secondary | ICD-10-CM | POA: Insufficient documentation

## 2023-05-10 DIAGNOSIS — M25551 Pain in right hip: Secondary | ICD-10-CM | POA: Insufficient documentation

## 2023-05-10 DIAGNOSIS — I10 Essential (primary) hypertension: Secondary | ICD-10-CM | POA: Insufficient documentation

## 2023-05-10 MED ORDER — PREDNISONE 50 MG PO TABS: 50.0000 mg | ORAL_TABLET | Freq: Every day | ORAL | 0 refills | Status: DC

## 2023-05-10 MED ORDER — PREDNISONE 50 MG PO TABS
60.0000 mg | ORAL_TABLET | Freq: Once | ORAL | Status: AC
  Administered 2023-05-10: 60 mg via ORAL
  Filled 2023-05-10: qty 1

## 2023-05-10 NOTE — ED Provider Notes (Signed)
[place] EMERGENCY DEPARTMENT AT [facility] Provider Note   CSN: [id number] Arrival date & time: 05/10/23  4098     History  Chief Complaint  Patient presents with   Hip Pain    Vicki [name] is a 57 y.o. female.  The history is provided by the patient.   Patient presents with ongoing right hip pain. Patient reports has been having this pain for quite some time.  When she got up this morning to go to work she lost her balance and bumped into the wall which worsened her pain.  She has not fallen to the ground recently, she can still ambulate but it hurts.  No new back pain, no new leg weakness.  Patient had previous left hip replacement, and was advised that she would likely need her right hip replaced   Past Medical History:  Diagnosis Date   Abnormal Pap smear    Anemia    Anxiety 2020   BV (bacterial vaginosis) 05/16/2013   Depression 2020   Dysrhythmia    Fibroids    uterine   Hypertension    Mental disorder    depression and panic attacks   Tachycardia 2018   pt states for the past 4 years she has went "in and out" of tachycardia    Home Medications Prior to Admission medications   Medication Sig Start Date End Date Taking? Authorizing Provider  predniSONE (DELTASONE) 50 MG tablet Take 1 tablet (50 mg total) by mouth daily with breakfast. 05/10/23  Yes Zadie Rhine, MD  acetaminophen (TYLENOL) 325 MG tablet Take 2 tablets (650 mg total) by mouth every 6 (six) hours as needed. 04/03/23   Sloan Leiter, DO  amLODipine (NORVASC) 5 MG tablet Take 1 tablet (5 mg total) by mouth daily. 03/10/21   Eber Hong, MD  aspirin 81 MG chewable tablet Chew 1 tablet (81 mg total) by mouth 2 (two) times daily. 10/06/21   Kathryne Hitch, MD  carvedilol (COREG) 3.125 MG tablet Take 1 tablet (3.125 mg total) by mouth 2 (two) times daily with a meal. 08/25/20   Idol, Raynelle Fanning, PA-C  cholecalciferol (VITAMIN D3) 25 MCG (1000 UNIT) tablet Take 1,000 Units by mouth  daily.    [provider]  citalopram (CELEXA) 40 MG tablet Take 40 mg by mouth daily. 09/18/21   [provider]  hydrOXYzine (ATARAX) 25 MG tablet Take 25 mg by mouth 3 (three) times daily as needed for anxiety. 04/27/22   [provider]  ibuprofen (ADVIL) 600 MG tablet Take 1 tablet (600 mg total) by mouth every 6 (six) hours as needed (take with food). 04/03/23   Sloan Leiter, DO  meclizine (ANTIVERT) 25 MG tablet Take 1 tablet (25 mg total) by mouth 3 (three) times daily as needed for dizziness. Patient not taking: Reported on 05/04/2022 11/09/18   Donnetta Hutching, MD  simvastatin (ZOCOR) 20 MG tablet Take 20 mg by mouth daily. 03/16/22   [provider]      Allergies    Patient has no known allergies.    Review of Systems   Review of Systems  Physical Exam Updated Vital Signs BP 129/76   Pulse 78   Resp 16   LMP 02/09/2014   SpO2 96%  Physical Exam CONSTITUTIONAL: Well developed/well nourished HEAD: Normocephalic/atraumatic SPINE/BACK:entire spine nontender NEURO: Pt is awake/alert/appropriate, moves all extremitiesx4.  No facial droop.   EXTREMITIES: pulses normal/equal, full ROM Tenderness with range of motion of right hip.  No obvious deformities.  No erythema, no warmth or discoloration to the right hip, nurse present for exam SKIN: warm, color normal  ED Results / Procedures / Treatments   Labs (all labs ordered are listed, but only abnormal results are displayed) Labs Reviewed - No data to display  EKG None  Radiology No results found.  Procedures Procedures    Medications Ordered in ED Medications  predniSONE (DELTASONE) tablet 60 mg (has no administration in time range)    ED Course/ Medical Decision Making/ A&P                             Medical Decision Making Risk Prescription drug management.   Patient presents with ongoing chronic right hip pain.  Reports she bumped into the wall tonight, but no significant  trauma reported.  She had recent x-ray that was negative for fracture.  No indication for repeat imaging at this time.  Low suspicion for septic joint.  She denies any back pain to suggest lumbosacral radiculopathy  Patient has not tried a course of steroids previously.  Will do a burst of prednisone, will refer back to her orthopedist.  Work note provided        Final Clinical Impression(s) / ED Diagnoses Final diagnoses:  Right hip pain    Rx / DC Orders ED Discharge Orders          Ordered    predniSONE (DELTASONE) 50 MG tablet  Daily with breakfast        05/10/23 0643              Zadie Rhine, MD 05/10/23 [phone]

## 2023-05-10 NOTE — ED Triage Notes (Signed)
Pt c/o right hip pain, states she is supposed to have surgery on it "some time this year". Ambulatory in triage

## 2023-06-26 ENCOUNTER — Emergency Department (HOSPITAL_COMMUNITY)
Admission: EM | Admit: 2023-06-26 | Discharge: 2023-06-26 | Disposition: A | Discharge status: Home / Self Care | Payer: Self-pay | Attending: Emergency Medicine | Admitting: Emergency Medicine

## 2023-06-26 ENCOUNTER — Other Ambulatory Visit: Payer: Self-pay

## 2023-06-26 ENCOUNTER — Encounter (HOSPITAL_COMMUNITY): Payer: Self-pay | Admitting: *Deleted

## 2023-06-26 DIAGNOSIS — I10 Essential (primary) hypertension: Secondary | ICD-10-CM | POA: Insufficient documentation

## 2023-06-26 DIAGNOSIS — Z7982 Long term (current) use of aspirin: Secondary | ICD-10-CM | POA: Insufficient documentation

## 2023-06-26 DIAGNOSIS — Z79899 Other long term (current) drug therapy: Secondary | ICD-10-CM | POA: Insufficient documentation

## 2023-06-26 NOTE — Discharge Instructions (Addendum)
Pleasure taking care of you today.  Your blood pressure is normal here in the ER, the most recent reading was 125/60.  Please follow-up closely with your primary care doctor for routine blood pressure checks if you have any new or worsening symptoms come back to the ER right away.

## 2023-06-26 NOTE — ED Provider Notes (Signed)
[place] EMERGENCY DEPARTMENT AT [facility] Provider Note   CSN: [id number] Arrival date & time: 06/26/23  1125     History  Chief Complaint  Patient presents with   Hypertension    Vicki [name] is a 57 y.o. female.  PMH significant for hypertension and GERD.  Patient is on Norvasc and carvedilol and has been compliant with these.  She states she is worse today had a mild headache so she went to the nurse, they checked her blood pressure and states it was elevated at 162/73.  They wanted her to go to the ER due to the symptoms of headache along with her elevated blood pressure.  She never had any difficulty with speech, vision changes, numbness tingling or weakness.   Hypertension       Home Medications Prior to Admission medications   Medication Sig Start Date End Date Taking? Authorizing Provider  acetaminophen (TYLENOL) 325 MG tablet Take 2 tablets (650 mg total) by mouth every 6 (six) hours as needed. 04/03/23   Sloan Leiter, DO  amLODipine (NORVASC) 5 MG tablet Take 1 tablet (5 mg total) by mouth daily. 03/10/21   Eber Hong, MD  aspirin 81 MG chewable tablet Chew 1 tablet (81 mg total) by mouth 2 (two) times daily. 10/06/21   Kathryne Hitch, MD  carvedilol (COREG) 3.125 MG tablet Take 1 tablet (3.125 mg total) by mouth 2 (two) times daily with a meal. 08/25/20   Idol, Raynelle Fanning, PA-C  cholecalciferol (VITAMIN D3) 25 MCG (1000 UNIT) tablet Take 1,000 Units by mouth daily.    [provider]  citalopram (CELEXA) 40 MG tablet Take 40 mg by mouth daily. 09/18/21   [provider]  hydrOXYzine (ATARAX) 25 MG tablet Take 25 mg by mouth 3 (three) times daily as needed for anxiety. 04/27/22   [provider]  ibuprofen (ADVIL) 600 MG tablet Take 1 tablet (600 mg total) by mouth every 6 (six) hours as needed (take with food). 04/03/23   Sloan Leiter, DO  meclizine (ANTIVERT) 25 MG tablet Take 1 tablet (25 mg total) by mouth 3 (three) times  daily as needed for dizziness. Patient not taking: Reported on 05/04/2022 11/09/18   Donnetta Hutching, MD  predniSONE (DELTASONE) 50 MG tablet Take 1 tablet (50 mg total) by mouth daily with breakfast. 05/10/23   Zadie Rhine, MD  simvastatin (ZOCOR) 20 MG tablet Take 20 mg by mouth daily. 03/16/22   [provider]      Allergies    Patient has no known allergies.    Review of Systems   Review of Systems  Physical Exam Updated Vital Signs BP 131/69 (BP Location: Right Arm)   Pulse 74   Temp 97.6 F (36.4 C) (Temporal)   Resp 14   Ht 5\' 7"  (1.702 m)   Wt 74.8 kg   LMP 02/09/2014   SpO2 97%   BMI 25.84 kg/m  Physical Exam Vitals and nursing note reviewed.  Constitutional:      General: She is not in acute distress.    Appearance: She is well-developed.  HENT:     Head: Normocephalic and atraumatic.  Eyes:     Conjunctiva/sclera: Conjunctivae normal.  Cardiovascular:     Rate and Rhythm: Normal rate and regular rhythm.     Heart sounds: No murmur heard. Pulmonary:     Effort: Pulmonary effort is normal. No respiratory distress.     Breath sounds: Normal breath sounds.  Abdominal:  Palpations: Abdomen is soft.     Tenderness: There is no abdominal tenderness.  Musculoskeletal:        General: No swelling.     Cervical back: Neck supple.  Skin:    General: Skin is warm and dry.     Capillary Refill: Capillary refill takes less than 2 seconds.  Neurological:     General: No focal deficit present.     Mental Status: She is alert and oriented to person, place, and time.     Sensory: No sensory deficit.     Motor: No weakness.     Coordination: Coordination normal.     Gait: Gait normal.  Psychiatric:        Mood and Affect: Mood normal.     ED Results / Procedures / Treatments   Labs (all labs ordered are listed, but only abnormal results are displayed) Labs Reviewed - No data to display  EKG None  Radiology No results  found.  Procedures Procedures    Medications Ordered in ED Medications - No data to display  ED Course/ Medical Decision Making/ A&P                             Medical Decision Making Dx: Hypertension, hypertensive emergency, migraine, intracranial hemorrhage, other ED course: Patient mild headache rated 3 out of 10 today, does not want any medication for it, went to the nurse at her job who took her blood pressure and it was somewhat elevated and they wanted to be evaluated in the ED, blood pressure is normal here.  Normal neurologic exam.  No indication for further testing, advised on follow-up and return precautions.           Final Clinical Impression(s) / ED Diagnoses Final diagnoses:  Hypertension, unspecified type    Rx / DC Orders ED Discharge Orders     None         Josem Kaufmann 06/26/23 1259    Rondel Baton, MD 06/27/23 [phone]

## 2023-06-26 NOTE — ED Triage Notes (Signed)
Pt had her bp checked at work, was 162/73.  Pt states HR sent her here.  +HA

## 2023-07-10 ENCOUNTER — Emergency Department (HOSPITAL_COMMUNITY): Payer: Self-pay

## 2023-07-10 ENCOUNTER — Other Ambulatory Visit: Payer: Self-pay

## 2023-07-10 ENCOUNTER — Emergency Department (HOSPITAL_COMMUNITY)
Admission: EM | Admit: 2023-07-10 | Discharge: 2023-07-10 | Disposition: A | Discharge status: Home / Self Care | Payer: Self-pay | Attending: Emergency Medicine | Admitting: Emergency Medicine

## 2023-07-10 DIAGNOSIS — Z7951 Long term (current) use of inhaled steroids: Secondary | ICD-10-CM | POA: Insufficient documentation

## 2023-07-10 DIAGNOSIS — Z7982 Long term (current) use of aspirin: Secondary | ICD-10-CM | POA: Insufficient documentation

## 2023-07-10 DIAGNOSIS — Z7952 Long term (current) use of systemic steroids: Secondary | ICD-10-CM | POA: Insufficient documentation

## 2023-07-10 DIAGNOSIS — J40 Bronchitis, not specified as acute or chronic: Secondary | ICD-10-CM | POA: Insufficient documentation

## 2023-07-10 MED ORDER — IPRATROPIUM-ALBUTEROL 0.5-2.5 (3) MG/3ML IN SOLN
3.0000 mL | Freq: Once | RESPIRATORY_TRACT | Status: AC
  Administered 2023-07-10: 3 mL via RESPIRATORY_TRACT
  Filled 2023-07-10: qty 3

## 2023-07-10 MED ORDER — PREDNISONE 20 MG PO TABS: 40.0000 mg | ORAL_TABLET | Freq: Every day | ORAL | 0 refills | Status: DC

## 2023-07-10 MED ORDER — DOXYCYCLINE HYCLATE 100 MG PO CAPS: 100.0000 mg | ORAL_CAPSULE | Freq: Two times a day (BID) | ORAL | 0 refills | Status: DC

## 2023-07-10 MED ORDER — ALBUTEROL SULFATE HFA 108 (90 BASE) MCG/ACT IN AERS: 2.0000 | INHALATION_SPRAY | RESPIRATORY_TRACT | 2 refills | Status: AC | PRN

## 2023-07-10 NOTE — ED Notes (Signed)
Pt to xray at this time.

## 2023-07-10 NOTE — ED Triage Notes (Signed)
Pt c/o cough x3 weeks, states she started having trouble breathing this morning along with wheezing. Denies hx of asthma.

## 2023-07-10 NOTE — ED Provider Notes (Signed)
[place] EMERGENCY DEPARTMENT AT [facility] Provider Note   CSN: [id number] Arrival date & time: 07/10/23  0537     History  Chief Complaint  Patient presents with   Cough    Vicki [name] is a 57 y.o. female.  Presents to the department for evaluation of cough.  She reports that the cough has been present for about 3 weeks.  Tonight she started feeling like she was having trouble breathing and was wheezing.       Home Medications Prior to Admission medications   Medication Sig Start Date End Date Taking? Authorizing Provider  albuterol (VENTOLIN HFA) 108 (90 Base) MCG/ACT inhaler Inhale 2 puffs into the lungs every 4 (four) hours as needed for wheezing or shortness of breath. 07/10/23  Yes Kol Consuegra, Canary Brim, MD  doxycycline (VIBRAMYCIN) 100 MG capsule Take 1 capsule (100 mg total) by mouth 2 (two) times daily. 07/10/23  Yes Jonanthony Nahar, Canary Brim, MD  predniSONE (DELTASONE) 20 MG tablet Take 2 tablets (40 mg total) by mouth daily with breakfast. 07/10/23  Yes Beckett Maden, Canary Brim, MD  acetaminophen (TYLENOL) 325 MG tablet Take 2 tablets (650 mg total) by mouth every 6 (six) hours as needed. 04/03/23   Sloan Leiter, DO  amLODipine (NORVASC) 5 MG tablet Take 1 tablet (5 mg total) by mouth daily. 03/10/21   Eber Hong, MD  aspirin 81 MG chewable tablet Chew 1 tablet (81 mg total) by mouth 2 (two) times daily. 10/06/21   Kathryne Hitch, MD  carvedilol (COREG) 3.125 MG tablet Take 1 tablet (3.125 mg total) by mouth 2 (two) times daily with a meal. 08/25/20   Idol, Raynelle Fanning, PA-C  cholecalciferol (VITAMIN D3) 25 MCG (1000 UNIT) tablet Take 1,000 Units by mouth daily.    [provider]  citalopram (CELEXA) 40 MG tablet Take 40 mg by mouth daily. 09/18/21   [provider]  hydrOXYzine (ATARAX) 25 MG tablet Take 25 mg by mouth 3 (three) times daily as needed for anxiety. 04/27/22   [provider]  ibuprofen (ADVIL) 600 MG tablet Take 1  tablet (600 mg total) by mouth every 6 (six) hours as needed (take with food). 04/03/23   Sloan Leiter, DO  meclizine (ANTIVERT) 25 MG tablet Take 1 tablet (25 mg total) by mouth 3 (three) times daily as needed for dizziness. Patient not taking: Reported on 05/04/2022 11/09/18   Donnetta Hutching, MD  simvastatin (ZOCOR) 20 MG tablet Take 20 mg by mouth daily. 03/16/22   [provider]      Allergies    Patient has no known allergies.    Review of Systems   Review of Systems  Physical Exam Updated Vital Signs BP (!) 151/85   Pulse 74   Temp 98.3 F (36.8 C) (Oral)   Resp 20   LMP 02/09/2014   SpO2 96%  Physical Exam Vitals and nursing note reviewed.  Constitutional:      General: She is not in acute distress.    Appearance: She is well-developed.  HENT:     Head: Normocephalic and atraumatic.     Mouth/Throat:     Mouth: Mucous membranes are moist.  Eyes:     General: Vision grossly intact. Gaze aligned appropriately.     Extraocular Movements: Extraocular movements intact.     Conjunctiva/sclera: Conjunctivae normal.  Cardiovascular:     Rate and Rhythm: Normal rate and regular rhythm.     Pulses: Normal pulses.  Heart sounds: Normal heart sounds, S1 normal and S2 normal. No murmur heard.    No friction rub. No gallop.  Pulmonary:     Effort: Pulmonary effort is normal. No respiratory distress.     Breath sounds: Normal breath sounds.  Abdominal:     General: Bowel sounds are normal.     Palpations: Abdomen is soft.     Tenderness: There is no abdominal tenderness. There is no guarding or rebound.     Hernia: No hernia is present.  Musculoskeletal:        General: No swelling.     Cervical back: Full passive range of motion without pain, normal range of motion and neck supple. No spinous process tenderness or muscular tenderness. Normal range of motion.     Right lower leg: No edema.     Left lower leg: No edema.  Skin:    General: Skin is warm and dry.      Capillary Refill: Capillary refill takes less than 2 seconds.     Findings: No ecchymosis, erythema, rash or wound.  Neurological:     General: No focal deficit present.     Mental Status: She is alert and oriented to person, place, and time.     GCS: GCS eye subscore is 4. GCS verbal subscore is 5. GCS motor subscore is 6.     Cranial Nerves: Cranial nerves 2-12 are intact.     Sensory: Sensation is intact.     Motor: Motor function is intact.     Coordination: Coordination is intact.  Psychiatric:        Attention and Perception: Attention normal.        Mood and Affect: Mood normal.        Speech: Speech normal.        Behavior: Behavior normal.     ED Results / Procedures / Treatments   Labs (all labs ordered are listed, but only abnormal results are displayed) Labs Reviewed - No data to display  EKG None  Radiology DG Chest 2 View  Result Date: 07/10/2023 CLINICAL DATA:  Cough for 3 weeks. EXAM: CHEST - 2 VIEW COMPARISON:  05/04/2022 FINDINGS: Normal heart size and mediastinal contours. No acute infiltrate or edema. No effusion or pneumothorax. No acute osseous findings. IMPRESSION: Negative chest. Electronically Signed   By: Tiburcio Pea M.D.   On: 07/10/2023 06:45    Procedures Procedures    Medications Ordered in ED Medications  ipratropium-albuterol (DUONEB) 0.5-2.5 (3) MG/3ML nebulizer solution 3 mL (3 mLs Nebulization Given 07/10/23 0607)    ED Course/ Medical Decision Making/ A&P                             Medical Decision Making Amount and/or Complexity of Data Reviewed Radiology: ordered.  Risk Prescription drug management.   Differential Diagnosis considered includes, but not limited to: COVID-19; influenza; RSV; simple viral URI; pneumonia  Patient reports shortness of breath, wheezing in the setting of cough ongoing for 3 weeks.  Patient with decreased air movement but no wheezing currently.  She is not hypoxic.  Chest x-ray clear, no  evidence of pneumonia.  Will treat for bronchitis.        Final Clinical Impression(s) / ED Diagnoses Final diagnoses:  Bronchitis    Rx / DC Orders ED Discharge Orders          Ordered    doxycycline (VIBRAMYCIN) 100 MG capsule  2 times daily        07/10/23 0655    predniSONE (DELTASONE) 20 MG tablet  Daily with breakfast        07/10/23 0655    albuterol (VENTOLIN HFA) 108 (90 Base) MCG/ACT inhaler  Every 4 hours PRN        07/10/23 0655              Gilda Crease, MD 07/10/23 [phone]

## 2023-07-17 ENCOUNTER — Telehealth: Payer: Self-pay | Admitting: Orthopedic Surgery

## 2023-07-17 NOTE — Telephone Encounter (Signed)
Returned the patient's call, lvm for her to call me back.  She was seen 05/10/23 in the ED at [facility] for her right hip.  She is wanting to be see for her right hip pain.

## 2023-08-01 ENCOUNTER — Ambulatory Visit: Payer: Self-pay | Admitting: Orthopedic Surgery

## 2023-08-06 ENCOUNTER — Encounter: Payer: Self-pay | Admitting: Orthopedic Surgery

## 2023-08-23 ENCOUNTER — Ambulatory Visit (INDEPENDENT_AMBULATORY_CARE_PROVIDER_SITE_OTHER): Payer: Self-pay | Admitting: Orthopedic Surgery

## 2023-08-23 ENCOUNTER — Other Ambulatory Visit: Payer: Self-pay

## 2023-08-23 ENCOUNTER — Encounter: Payer: Self-pay | Admitting: Orthopedic Surgery

## 2023-08-23 VITALS — BP 145/79 | HR 96 | Ht 67.0 in | Wt 174.0 lb

## 2023-08-23 DIAGNOSIS — M5417 Radiculopathy, lumbosacral region: Secondary | ICD-10-CM

## 2023-08-23 DIAGNOSIS — M545 Low back pain, unspecified: Secondary | ICD-10-CM

## 2023-08-23 MED ORDER — IBUPROFEN 800 MG PO TABS: 800.0000 mg | ORAL_TABLET | Freq: Three times a day (TID) | ORAL | 1 refills | Status: DC | PRN

## 2023-08-23 MED ORDER — TIZANIDINE HCL 4 MG PO TABS: 4.0000 mg | ORAL_TABLET | Freq: Four times a day (QID) | ORAL | 1 refills | Status: DC | PRN

## 2023-08-23 MED ORDER — GABAPENTIN 300 MG PO CAPS: 300.0000 mg | ORAL_CAPSULE | Freq: Every day | ORAL | 2 refills | Status: DC

## 2023-08-23 NOTE — Progress Notes (Signed)
Office Visit Note   Patient: Vicki [name]           Date of Birth: 08-07-1966           MRN: [id number] Visit Date: 08/23/2023 Requested by: Pearson Grippe, MD [address] [place],  [place] [address] PCP: Pearson Grippe, MD   Assessment & Plan:   Encounter Diagnoses  Name Primary?   Lumbar pain    Lumbosacral radiculopathy at L4 Yes    Meds ordered this encounter  Medications   ibuprofen (ADVIL) 800 MG tablet    Sig: Take 1 tablet (800 mg total) by mouth every 8 (eight) hours as needed.    Dispense:  90 tablet    Refill:  1   gabapentin (NEURONTIN) 300 MG capsule    Sig: Take 1 capsule (300 mg total) by mouth at bedtime.    Dispense:  30 capsule    Refill:  2   tiZANidine (ZANAFLEX) 4 MG tablet    Sig: Take 1 tablet (4 mg total) by mouth every 6 (six) hours as needed for muscle spasms.    Dispense:  60 tablet    Refill:  1    Vicki [name]'s pain seems to be coming from her back acute back pain.  She does not have the groin pain she had last.  The pain is in the buttock and radiates down the inner portion of the leg and seems to indicate a L4 root irritation  She is anxious to return to work and can do so on September 3  She is to take the following medications.  I did not send her to physical therapy, does not have health insurance     Subjective: Chief Complaint  Patient presents with   Hip Pain    Right hip pain DOI 07/17/23 at work.  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  He was injured in July.  She took ibuprofen and muscle relaxer and seem to be getting better and is anxious to return to work.  She has pain it radiates down the medial side of the leg and foot reports no  weakness              ROS: She does not have any bowel or bladder dysfunction cauda equina symptoms   Images personally read and my interpretation : Hip x-rays were done in the ER the right hip looks normal the left hip replacement looks normal  I took a lumbar film and other than some alignment issues and [facility] of the panels think that  Is coming from tight muscles  Visit Diagnoses:  1. Lumbosacral radiculopathy at L4   2. Lumbar pain      Follow-Up Instructions: Return in about 6 weeks (around 10/04/2023) for FOLLOW UP, BACK.    Objective: Vital Signs: BP (!) 145/79   Pulse 96   Ht 5\' 7"  (1.702 m)   Wt 174 lb (78.9 kg)   LMP 02/09/2014   BMI 27.25 kg/m   Physical Exam   Ortho Exam   Specialty Comments:  No specialty comments available.  Imaging: No results found.   PMFS History: Patient Active Problem List  Diagnosis Date Noted   Low back pain 02/01/2023   Mild major depression, single episode (HCC) 02/23/2022   Avascular necrosis of hip, left (HCC) 10/06/2021   Status post left hip replacement 10/06/2021   Noncompliance with medication regimen 05/03/2021   S/P abdominal hysterectomy 02/25/2014   Depression 07/25/2013   Panic attacks 07/25/2013   Fibroids 05/16/2013   BV (bacterial vaginosis) 05/16/2013   Anemia 04/03/2012   Menorrhagia 04/03/2012   Hypokalemia 04/03/2012   UTI (lower urinary tract infection) 04/03/2012   Emesis 04/03/2012   Past Medical History:  Diagnosis Date   Abnormal Pap smear    Anemia    Anxiety 2020   BV (bacterial vaginosis) 05/16/2013   Depression 2020   Dysrhythmia    Fibroids    uterine   Hypertension    Mental disorder    depression and panic attacks   Tachycardia 2018   pt states for the past 4 years she has went "in and out" of tachycardia    Family History  Problem Relation Age of Onset   Congestive Heart Failure Mother    Congestive Heart Failure Maternal Grandmother    Cancer Paternal Grandmother         ovarian    Past Surgical History:  Procedure Laterality Date   ABDOMINAL HYSTERECTOMY     BILATERAL SALPINGECTOMY Bilateral 02/25/2014   Procedure: BILATERAL SALPINGECTOMY;  Surgeon: Lazaro Arms, MD;  Location: [facility];  Service: Gynecology;  Laterality: Bilateral;   DILATION AND CURETTAGE OF UTERUS     SUPRACERVICAL ABDOMINAL HYSTERECTOMY N/A 02/25/2014   Procedure: HYSTERECTOMY SUPRACERVICAL ABDOMINAL;  Surgeon: Lazaro Arms, MD;  Location: [facility];  Service: Gynecology;  Laterality: N/A;   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   Procedure: LEFT TOTAL HIP ARTHROPLASTY ANTERIOR APPROACH;  Surgeon: Kathryne Hitch, MD;  Location: [facility] OR;  Service: Orthopedics;  Laterality: Left;   TUBAL LIGATION     Social History   Occupational History   Not on file  Tobacco Use   Smoking status: Never   Smokeless tobacco: Never  Vaping Use   Vaping status: Never Used  Substance and Sexual Activity   Alcohol use: Yes    Comment: 1 beer daily    Drug use: No   Sexual activity: Not Currently    Birth control/protection: Surgical

## 2023-08-29 ENCOUNTER — Encounter: Payer: Self-pay | Admitting: Orthopedic Surgery

## 2023-08-29 ENCOUNTER — Telehealth: Payer: Self-pay | Admitting: Orthopedic Surgery

## 2023-08-29 ENCOUNTER — Other Ambulatory Visit (INDEPENDENT_AMBULATORY_CARE_PROVIDER_SITE_OTHER): Payer: Self-pay

## 2023-08-29 ENCOUNTER — Other Ambulatory Visit: Payer: Self-pay | Admitting: Orthopedic Surgery

## 2023-08-29 DIAGNOSIS — M545 Low back pain, unspecified: Secondary | ICD-10-CM

## 2023-08-29 DIAGNOSIS — M5417 Radiculopathy, lumbosacral region: Secondary | ICD-10-CM

## 2023-08-29 NOTE — Telephone Encounter (Signed)
Dr. Mort Sawyers pt - pt was seen 8/29, she is requesting a RTW note w/no restrictions.  Please advise.

## 2023-08-29 NOTE — Telephone Encounter (Signed)
Yes, that is fine. 

## 2023-09-18 ENCOUNTER — Emergency Department (HOSPITAL_COMMUNITY)
Admission: EM | Admit: 2023-09-18 | Discharge: 2023-09-18 | Disposition: A | Discharge status: Home / Self Care | Payer: Self-pay | Attending: Emergency Medicine | Admitting: Emergency Medicine

## 2023-09-18 ENCOUNTER — Encounter (HOSPITAL_COMMUNITY): Payer: Self-pay

## 2023-09-18 ENCOUNTER — Other Ambulatory Visit: Payer: Self-pay

## 2023-09-18 DIAGNOSIS — Z7982 Long term (current) use of aspirin: Secondary | ICD-10-CM | POA: Insufficient documentation

## 2023-09-18 DIAGNOSIS — M25551 Pain in right hip: Secondary | ICD-10-CM | POA: Insufficient documentation

## 2023-09-18 MED ORDER — OXYCODONE-ACETAMINOPHEN 5-325 MG PO TABS: 1.0000 | ORAL_TABLET | Freq: Four times a day (QID) | ORAL | 0 refills | Status: DC | PRN

## 2023-09-18 NOTE — ED Provider Notes (Signed)
[place] EMERGENCY DEPARTMENT AT [facility] Provider Note   CSN: [id number] Arrival date & time: 09/18/23  4098     History  Chief Complaint  Patient presents with   Hip Pain    Vicki [name] is a 57 y.o. female.  She is complaining of worsening right hip pain since she woke up this morning.  She has a chronic history of hip pain, has needed a hip replacement on the left.  She said she is mostly at surgery on the right and has an appointment with orthopedics in a few weeks.  She has been taking Advil and a muscle relaxant and gabapentin but they are not helping.  No new trauma.  No fevers.  Pain radiates down the entire left leg.  No back pain.  The history is provided by the patient.  Hip Pain This is a chronic problem. The problem occurs constantly. The problem has been gradually worsening. Pertinent negatives include no chest pain, no abdominal pain, no headaches and no shortness of breath. The symptoms are aggravated by bending, twisting and walking. Nothing relieves the symptoms. She has tried rest for the symptoms. The treatment provided no relief.       Home Medications Prior to Admission medications   Medication Sig Start Date End Date Taking? Authorizing Provider  acetaminophen (TYLENOL) 325 MG tablet Take 2 tablets (650 mg total) by mouth every 6 (six) hours as needed. 04/03/23   Sloan Leiter, DO  albuterol (VENTOLIN HFA) 108 (90 Base) MCG/ACT inhaler Inhale 2 puffs into the lungs every 4 (four) hours as needed for wheezing or shortness of breath. 07/10/23   Gilda Crease, MD  amLODipine (NORVASC) 5 MG tablet Take 1 tablet (5 mg total) by mouth daily. 03/10/21   Eber Hong, MD  aspirin 81 MG chewable tablet Chew 1 tablet (81 mg total) by mouth 2 (two) times daily. 10/06/21   Kathryne Hitch, MD  carvedilol (COREG) 3.125 MG tablet Take 1 tablet (3.125 mg total) by mouth 2 (two) times daily with a meal. 08/25/20   Idol, Raynelle Fanning, PA-C   cholecalciferol (VITAMIN D3) 25 MCG (1000 UNIT) tablet Take 1,000 Units by mouth daily.    [provider]  citalopram (CELEXA) 40 MG tablet Take 40 mg by mouth daily. 09/18/21   [provider]  doxycycline (VIBRAMYCIN) 100 MG capsule Take 1 capsule (100 mg total) by mouth 2 (two) times daily. 07/10/23   Gilda Crease, MD  gabapentin (NEURONTIN) 300 MG capsule Take 1 capsule (300 mg total) by mouth at bedtime. 08/23/23   Vickki Hearing, MD  hydrOXYzine (ATARAX) 25 MG tablet Take 25 mg by mouth 3 (three) times daily as needed for anxiety. 04/27/22   [provider]  ibuprofen (ADVIL) 800 MG tablet Take 1 tablet (800 mg total) by mouth every 8 (eight) hours as needed. 08/23/23   Vickki Hearing, MD  meclizine (ANTIVERT) 25 MG tablet Take 1 tablet (25 mg total) by mouth 3 (three) times daily as needed for dizziness. 11/09/18   Donnetta Hutching, MD  predniSONE (DELTASONE) 20 MG tablet Take 2 tablets (40 mg total) by mouth daily with breakfast. Patient not taking: Reported on 08/23/2023 07/10/23   Gilda Crease, MD  simvastatin (ZOCOR) 20 MG tablet Take 20 mg by mouth daily. 03/16/22   [provider]  tiZANidine (ZANAFLEX) 4 MG tablet Take 1 tablet (4 mg total) by mouth every 6 (six) hours as needed for muscle spasms.  08/23/23   Vickki Hearing, MD      Allergies    Patient has no known allergies.    Review of Systems   Review of Systems  Constitutional:  Negative for fever.  Respiratory:  Negative for shortness of breath.   Cardiovascular:  Negative for chest pain.  Gastrointestinal:  Negative for abdominal pain.  Neurological:  Negative for weakness, numbness and headaches.    Physical Exam Updated Vital Signs BP (!) 140/81 (BP Location: Right Arm)   Pulse 76   Temp 98 F (36.7 C) (Oral)   Resp 18   Ht 5\' 7"  (1.702 m)   Wt 78.9 kg   LMP 02/09/2014   SpO2 97%   BMI 27.25 kg/m  Physical Exam Constitutional:      Appearance:  Normal appearance. She is well-developed.  HENT:     Head: Normocephalic and atraumatic.  Musculoskeletal:        General: Tenderness present. Normal range of motion.     Cervical back: Neck supple.     Right lower leg: No edema.     Left lower leg: No edema.     Comments: She has pain with range of motion of right hip although has normal range of motion.  Knee nontender full range of motion.  No leg swelling and distal pulses intact.  Skin:    General: Skin is warm and dry.  Neurological:     General: No focal deficit present.     Mental Status: She is alert.     GCS: GCS eye subscore is 4. GCS verbal subscore is 5. GCS motor subscore is 6.     Sensory: No sensory deficit.     Motor: No weakness.     ED Results / Procedures / Treatments   Labs (all labs ordered are listed, but only abnormal results are displayed) Labs Reviewed - No data to display  EKG None  Radiology No results found.  Procedures Procedures    Medications Ordered in ED Medications - No data to display  ED Course/ Medical Decision Making/ A&P                                 Medical Decision Making Risk Prescription drug management.   This patient complains of right hip pain; this involves an extensive number of treatment Options and is a complaint that carries with it a high risk of complications and morbidity. The differential includes arthritis, stress fractures, bursitis, AVN, fracture, dislocation Previous records obtained and reviewed in epic, including recent orthopedic notes, to On patient's longstanding hip pain Social determinants considered, no significant barriers Critical Interventions: None  After the interventions stated above, I reevaluated the patient and found patient to be neurovascularly intact Admission and further testing considered, no indications for admission.  I offered patient x-rays which she declines feeling there is no likelihood of fracture or dislocation.  Will  increase her pain regiment to some as needed narcotic.  We discussed risks and benefits of this.  She will take this up with her orthopedic doctor.  Return instructions discussed         Final Clinical Impression(s) / ED Diagnoses Final diagnoses:  Acute pain of right hip    Rx / DC Orders ED Discharge Orders          Ordered    oxyCODONE-acetaminophen (PERCOCET/ROXICET) 5-325 MG tablet  Every 6 hours PRN  09/18/23 0757              Terrilee Files, MD 09/18/23 [phone]

## 2023-09-18 NOTE — ED Triage Notes (Addendum)
Pt c/o R hip pain radiating down leg and into foot.  Pain score 10/10.  Pt denies new injury.  Pt reports she has an appointment w/ Ortho on 10/10.    Pt has been taking OTC medications w/o relief.

## 2023-09-18 NOTE — ED Notes (Signed)
Pt verbalized understanding of discharge instructions. Opportunity for questions provided. Work note provided w/ AVS.

## 2023-10-03 ENCOUNTER — Other Ambulatory Visit: Payer: Self-pay

## 2023-10-03 ENCOUNTER — Emergency Department (HOSPITAL_COMMUNITY)
Admission: EM | Admit: 2023-10-03 | Discharge: 2023-10-03 | Disposition: A | Discharge status: Home / Self Care | Payer: Self-pay | Attending: Emergency Medicine | Admitting: Emergency Medicine

## 2023-10-03 DIAGNOSIS — M25551 Pain in right hip: Secondary | ICD-10-CM | POA: Insufficient documentation

## 2023-10-03 DIAGNOSIS — I10 Essential (primary) hypertension: Secondary | ICD-10-CM | POA: Insufficient documentation

## 2023-10-03 MED ORDER — LIDOCAINE 5 % EX PTCH
1.0000 | MEDICATED_PATCH | CUTANEOUS | Status: DC
  Administered 2023-10-03: 1 via TRANSDERMAL
  Filled 2023-10-03: qty 1

## 2023-10-03 MED ORDER — NAPROXEN 250 MG PO TABS
500.0000 mg | ORAL_TABLET | Freq: Once | ORAL | Status: AC
  Administered 2023-10-03: 500 mg via ORAL
  Filled 2023-10-03: qty 2

## 2023-10-03 MED ORDER — LIDOCAINE 5 % EX PTCH: 1.0000 | MEDICATED_PATCH | CUTANEOUS | 0 refills | Status: DC

## 2023-10-03 MED ORDER — METHOCARBAMOL 500 MG PO TABS: 500.0000 mg | ORAL_TABLET | Freq: Three times a day (TID) | ORAL | 0 refills | Status: DC | PRN

## 2023-10-03 NOTE — ED Provider Notes (Signed)
[facility] Emergency Department Provider Note MRN:  [id number]  Arrival date & time: 10/03/23     Chief Complaint   Hip Pain   History of Present Illness   Vicki [name] is a 57 y.o. year-old female with a history of hypertension presenting to the ED with chief complaint of hip pain.  Continued right hip pain for several months.  Was seen last month in the emergency department.  Since then has followed up with her orthopedist who told her that the hip was not getting better but it was not getting worse.  Pain continues, not able to really control it with home medications.  Has not been taking the oxycodone because she does not like how it makes her feel.  Denies trauma, no fever.  Review of Systems  A thorough review of systems was obtained and all systems are negative except as noted in the HPI and PMH.   Patient's Health History    Past Medical History:  Diagnosis Date   Abnormal Pap smear    Anemia    Anxiety 2020   BV (bacterial vaginosis) 05/16/2013   Depression 2020   Dysrhythmia    Fibroids    uterine   Hypertension    Mental disorder    depression and panic attacks   Tachycardia 2018   pt states for the past 4 years she has went "in and out" of tachycardia    Past Surgical History:  Procedure Laterality Date   ABDOMINAL HYSTERECTOMY     BILATERAL SALPINGECTOMY Bilateral 02/25/2014   Procedure: BILATERAL SALPINGECTOMY;  Surgeon: Lazaro Arms, MD;  Location: [facility] ORS;  Service: Gynecology;  Laterality: Bilateral;   DILATION AND CURETTAGE OF UTERUS     SUPRACERVICAL ABDOMINAL HYSTERECTOMY N/A 02/25/2014   Procedure: HYSTERECTOMY SUPRACERVICAL ABDOMINAL;  Surgeon: Lazaro Arms, MD;  Location: [facility];  Service: Gynecology;  Laterality: N/A;   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   Procedure: LEFT TOTAL HIP ARTHROPLASTY ANTERIOR APPROACH;  Surgeon: Kathryne Hitch, MD;  Location: [facility] OR;  Service: Orthopedics;  Laterality: Left;   TUBAL  LIGATION      Family History  Problem Relation Age of Onset   Congestive Heart Failure Mother    Congestive Heart Failure Maternal Grandmother    Cancer Paternal Grandmother        ovarian    Social History   Socioeconomic History   Marital status: Legally Separated    Spouse name: Not on file   Number of children: Not on file   Years of education: Not on file   Highest education level: Not on file  Occupational History   Not on file  Tobacco Use   Smoking status: Never   Smokeless tobacco: Never  Vaping Use   Vaping status: Never Used  Substance and Sexual Activity   Alcohol use: Yes    Comment: 1 beer daily    Drug use: No   Sexual activity: Not Currently    Birth control/protection: Surgical  Other Topics Concern   Not on file  Social History Narrative   Not on file   Social Determinants of Health   Financial Resource Strain: Not on file  Food Insecurity: Not on file  Transportation Needs: Not on file  Physical Activity: Not on file  Stress: Not on file  Social Connections: Not on file  Intimate Partner Violence: Not on file     Physical Exam   Vitals:   10/03/23 0603  BP: Marland Kitchen)  145/75  Pulse: 70  Resp: 19  Temp: 98.4 F (36.9 C)  SpO2: 100%    CONSTITUTIONAL: Well-appearing, NAD NEURO/PSYCH:  Alert and oriented x 3, no focal deficits EYES:  eyes equal and reactive ENT/NECK:  no LAD, no JVD CARDIO: Regular rate, well-perfused, normal S1 and S2 PULM:  CTAB no wheezing or rhonchi GI/GU:  non-distended, non-tender MSK/SPINE:  No gross deformities, no edema SKIN:  no rash, atraumatic   *Additional and/or pertinent findings included in MDM below  Diagnostic and Interventional Summary    EKG Interpretation Date/Time:    Ventricular Rate:    PR Interval:    QRS Duration:    QT Interval:    QTC Calculation:   R Axis:      Text Interpretation:         Labs Reviewed - No data to display  No orders to display    Medications  lidocaine  (LIDODERM) 5 % 1 patch (has no administration in time range)  naproxen (NAPROSYN) tablet 500 mg (has no administration in time range)     Procedures  /  Critical Care Procedures  ED Course and Medical Decision Making  Initial Impression and Ddx Suspect osteoarthritis, no trauma to warrant imaging.  More of a chronic pain, already has seen orthopedist.  Not really wanting to take the prescribed opioids at home.  Doubt significant bony abnormality.  Seems like a visit more for pain control or change to the pain regimen.  Advised to continue orthopedic follow-up.  Past medical/surgical history that increases complexity of ED encounter: Within the left hip  Interpretation of Diagnostics Laboratory and/or imaging options to aid in the diagnosis/care of the patient were considered.  After careful history and physical examination, it was determined that there was no indication for diagnostics at this time.  Patient Reassessment and Ultimate Disposition/Management     Discharge.  Patient agrees to follow-up with Dr. Romeo Apple again.  Patient management required discussion with the following services or consulting groups:  None  Complexity of Problems Addressed Acute complicated illness or Injury  Additional Data Reviewed and Analyzed Further history obtained from: Prior labs/imaging results  Additional Factors Impacting ED Encounter Risk Prescriptions  Elmer Sow. Pilar Plate, MD [facility] Emergency Medicine [facility] [email or web address]  Final Clinical Impressions(s) / ED Diagnoses     ICD-10-CM   1. Right hip pain  M25.551       ED Discharge Orders          Ordered    lidocaine (LIDODERM) 5 %  Every 24 hours        10/03/23 0622    methocarbamol (ROBAXIN) 500 MG tablet  Every 8 hours PRN        10/03/23 0622             Discharge Instructions Discussed with and Provided to Patient:    Discharge Instructions      You were evaluated in the Emergency  Department and after careful evaluation, we did not find any emergent condition requiring admission or further testing in the hospital.  Your exam/testing today is overall reassuring.  Suspect pain due to arthritis.  1000 mg every 4-6 hours.  Use the Lidoderm patches daily.  Try stopping the tizanidine and trying the Robaxin muscle relaxer.  Follow-up with your orthopedist.  Please return to the Emergency Department if you experience any worsening of your condition.   Thank you for allowing [place] to be a part of your care.  Sabas Sous, MD 10/03/23 [phone]

## 2023-10-03 NOTE — ED Triage Notes (Signed)
Pt c/o right hip pain, hx of same. Reports she has been taking Gabapentin and OTC medications with minimal relief

## 2023-10-03 NOTE — Discharge Instructions (Addendum)
You were evaluated in the Emergency Department and after careful evaluation, we did not find any emergent condition requiring admission or further testing in the hospital.  Your exam/testing today is overall reassuring.  Suspect pain due to arthritis.  1000 mg every 4-6 hours.  Use the Lidoderm patches daily.  Try stopping the tizanidine and trying the Robaxin muscle relaxer.  Follow-up with your orthopedist.  Please return to the Emergency Department if you experience any worsening of your condition.   Thank you for allowing [place] to be a part of your care.

## 2023-10-03 NOTE — ED Notes (Signed)
ED Provider at bedside. 

## 2023-10-04 ENCOUNTER — Ambulatory Visit: Payer: Self-pay | Admitting: Orthopedic Surgery

## 2023-10-17 ENCOUNTER — Other Ambulatory Visit: Payer: Self-pay

## 2023-10-17 ENCOUNTER — Encounter (HOSPITAL_COMMUNITY): Payer: Self-pay | Admitting: Emergency Medicine

## 2023-10-17 ENCOUNTER — Emergency Department (HOSPITAL_COMMUNITY)
Admission: EM | Admit: 2023-10-17 | Discharge: 2023-10-17 | Disposition: A | Discharge status: Home / Self Care | Payer: Self-pay | Attending: Emergency Medicine | Admitting: Emergency Medicine

## 2023-10-17 DIAGNOSIS — Z7951 Long term (current) use of inhaled steroids: Secondary | ICD-10-CM | POA: Insufficient documentation

## 2023-10-17 DIAGNOSIS — I10 Essential (primary) hypertension: Secondary | ICD-10-CM | POA: Insufficient documentation

## 2023-10-17 DIAGNOSIS — M542 Cervicalgia: Secondary | ICD-10-CM | POA: Insufficient documentation

## 2023-10-17 DIAGNOSIS — Z79899 Other long term (current) drug therapy: Secondary | ICD-10-CM | POA: Insufficient documentation

## 2023-10-17 DIAGNOSIS — M436 Torticollis: Secondary | ICD-10-CM | POA: Insufficient documentation

## 2023-10-17 DIAGNOSIS — Z7982 Long term (current) use of aspirin: Secondary | ICD-10-CM | POA: Insufficient documentation

## 2023-10-17 DIAGNOSIS — J45909 Unspecified asthma, uncomplicated: Secondary | ICD-10-CM | POA: Insufficient documentation

## 2023-10-17 MED ORDER — CYCLOBENZAPRINE HCL 10 MG PO TABS: 10.0000 mg | ORAL_TABLET | Freq: Three times a day (TID) | ORAL | 0 refills | Status: DC | PRN

## 2023-10-17 MED ORDER — NAPROXEN 500 MG PO TABS: 500.0000 mg | ORAL_TABLET | Freq: Two times a day (BID) | ORAL | 0 refills | Status: DC

## 2023-10-17 NOTE — ED Triage Notes (Signed)
Pt c/o right sided neck pain x 1 mos. Pt describes it as having "whip lash"

## 2023-10-17 NOTE — Discharge Instructions (Signed)
Begin taking naproxen as prescribed.  Begin taking Flexeril as prescribed as needed for pain not relieved with naproxen.  Follow-up with your primary doctor if symptoms are not improving in the next week. 

## 2023-10-17 NOTE — ED Provider Notes (Signed)
[place] EMERGENCY DEPARTMENT AT [facility] Provider Note   CSN: [id number] Arrival date & time: 10/17/23  4098     History  Chief Complaint  Patient presents with   Torticollis    MARTINI BRASSELL is a 57 y.o. female.  Patient is a 57 year old female with past medical history of hypertension, asthma, anemia.  Patient presenting today for evaluation of neck pain.  She describes a 1 month history of pain to the right side of her neck that comes and goes when she turns her head a certain direction.  She denies any weakness or numbness of her arms.  She denies any specific injury or trauma.  She has not been seen previously for this.  The history is provided by the patient.       Home Medications Prior to Admission medications   Medication Sig Start Date End Date Taking? Authorizing Provider  acetaminophen (TYLENOL) 325 MG tablet Take 2 tablets (650 mg total) by mouth every 6 (six) hours as needed. 04/03/23   Sloan Leiter, DO  albuterol (VENTOLIN HFA) 108 (90 Base) MCG/ACT inhaler Inhale 2 puffs into the lungs every 4 (four) hours as needed for wheezing or shortness of breath. 07/10/23   Gilda Crease, MD  amLODipine (NORVASC) 5 MG tablet Take 1 tablet (5 mg total) by mouth daily. 03/10/21   Eber Hong, MD  aspirin 81 MG chewable tablet Chew 1 tablet (81 mg total) by mouth 2 (two) times daily. 10/06/21   Kathryne Hitch, MD  carvedilol (COREG) 3.125 MG tablet Take 1 tablet (3.125 mg total) by mouth 2 (two) times daily with a meal. 08/25/20   Idol, Raynelle Fanning, PA-C  cholecalciferol (VITAMIN D3) 25 MCG (1000 UNIT) tablet Take 1,000 Units by mouth daily.    [provider]  citalopram (CELEXA) 40 MG tablet Take 40 mg by mouth daily. 09/18/21   [provider]  doxycycline (VIBRAMYCIN) 100 MG capsule Take 1 capsule (100 mg total) by mouth 2 (two) times daily. 07/10/23   Gilda Crease, MD  gabapentin (NEURONTIN) 300 MG capsule Take 1 capsule  (300 mg total) by mouth at bedtime. 08/23/23   Vickki Hearing, MD  hydrOXYzine (ATARAX) 25 MG tablet Take 25 mg by mouth 3 (three) times daily as needed for anxiety. 04/27/22   [provider]  ibuprofen (ADVIL) 800 MG tablet Take 1 tablet (800 mg total) by mouth every 8 (eight) hours as needed. 08/23/23   Vickki Hearing, MD  lidocaine (LIDODERM) 5 % Place 1 patch onto the skin daily. Remove & Discard patch within 12 hours or as directed by MD 10/03/23   Sabas Sous, MD  meclizine (ANTIVERT) 25 MG tablet Take 1 tablet (25 mg total) by mouth 3 (three) times daily as needed for dizziness. 11/09/18   Donnetta Hutching, MD  methocarbamol (ROBAXIN) 500 MG tablet Take 1 tablet (500 mg total) by mouth every 8 (eight) hours as needed for muscle spasms. 10/03/23   Sabas Sous, MD  oxyCODONE-acetaminophen (PERCOCET/ROXICET) 5-325 MG tablet Take 1 tablet by mouth every 6 (six) hours as needed for severe pain. 09/18/23   Terrilee Files, MD  predniSONE (DELTASONE) 20 MG tablet Take 2 tablets (40 mg total) by mouth daily with breakfast. Patient not taking: Reported on 08/23/2023 07/10/23   Gilda Crease, MD  simvastatin (ZOCOR) 20 MG tablet Take 20 mg by mouth daily. 03/16/22   [provider]      Allergies  Patient has no known allergies.    Review of Systems   Review of Systems  All other systems reviewed and are negative.   Physical Exam Updated Vital Signs BP (!) 151/78 (BP Location: Right Arm)   Pulse 68   Temp 98.5 F (36.9 C) (Oral)   Resp 17   Ht 5\' 7"  (1.702 m)   Wt 77.1 kg   LMP 02/09/2014   SpO2 98%   BMI 26.63 kg/m  Physical Exam Vitals and nursing note reviewed.  Constitutional:      Appearance: Normal appearance.  Neck:     Comments: The right side of the neck appears normal to palpation.  There is no visible abnormality. Pulmonary:     Effort: Pulmonary effort is normal.  Musculoskeletal:     Cervical back: Normal range of motion.   Skin:    General: Skin is warm and dry.  Neurological:     Mental Status: She is alert and oriented to person, place, and time.     Comments: The right arm is neurologically intact.  Sensation is intact throughout the entire hand and she is able to flex, extend, and oppose all fingers.     ED Results / Procedures / Treatments   Labs (all labs ordered are listed, but only abnormal results are displayed) Labs Reviewed - No data to display  EKG None  Radiology No results found.  Procedures Procedures    Medications Ordered in ED Medications - No data to display  ED Course/ Medical Decision Making/ A&P  Patient presenting with neck pain as described in the HPI.  I suspect muscle strain/torticollis.  This to be treated with naproxen and Flexeril.  Patient to follow-up with primary doctor if not improving.  Final Clinical Impression(s) / ED Diagnoses Final diagnoses:  None    Rx / DC Orders ED Discharge Orders     None         Geoffery Lyons, MD 10/17/23 [phone]

## 2023-11-12 ENCOUNTER — Encounter (HOSPITAL_COMMUNITY): Payer: Self-pay | Admitting: Emergency Medicine

## 2023-11-12 ENCOUNTER — Emergency Department (HOSPITAL_COMMUNITY): Payer: Self-pay

## 2023-11-12 ENCOUNTER — Other Ambulatory Visit: Payer: Self-pay

## 2023-11-12 ENCOUNTER — Emergency Department (HOSPITAL_COMMUNITY)
Admission: EM | Admit: 2023-11-12 | Discharge: 2023-11-12 | Disposition: A | Discharge status: Home / Self Care | Payer: Self-pay | Attending: Emergency Medicine | Admitting: Emergency Medicine

## 2023-11-12 DIAGNOSIS — M546 Pain in thoracic spine: Secondary | ICD-10-CM | POA: Insufficient documentation

## 2023-11-12 DIAGNOSIS — Z7982 Long term (current) use of aspirin: Secondary | ICD-10-CM | POA: Insufficient documentation

## 2023-11-12 DIAGNOSIS — Z79899 Other long term (current) drug therapy: Secondary | ICD-10-CM | POA: Insufficient documentation

## 2023-11-12 DIAGNOSIS — I10 Essential (primary) hypertension: Secondary | ICD-10-CM | POA: Insufficient documentation

## 2023-11-12 MED ORDER — LIDOCAINE 5 % EX PTCH
1.0000 | MEDICATED_PATCH | CUTANEOUS | Status: DC
  Administered 2023-11-12: 1 via TRANSDERMAL
  Filled 2023-11-12: qty 1

## 2023-11-12 MED ORDER — LIDOCAINE 5 % EX PTCH: 1.0000 | MEDICATED_PATCH | CUTANEOUS | 0 refills | Status: AC

## 2023-11-12 MED ORDER — LIDOCAINE 5 % EX PTCH: 1.0000 | MEDICATED_PATCH | CUTANEOUS | 0 refills | Status: DC

## 2023-11-12 MED ORDER — METHOCARBAMOL 500 MG PO TABS: 1000.0000 mg | ORAL_TABLET | Freq: Three times a day (TID) | ORAL | 0 refills | Status: DC | PRN

## 2023-11-12 MED ORDER — METHOCARBAMOL 500 MG PO TABS
1000.0000 mg | ORAL_TABLET | Freq: Once | ORAL | Status: AC
  Administered 2023-11-12: 1000 mg via ORAL
  Filled 2023-11-12: qty 2

## 2023-11-12 NOTE — ED Triage Notes (Signed)
Pt c/o back pain to middle, left back x 2 days, pt reports she is doing a different job that involves sitting and is used to standing, denies injury

## 2023-11-12 NOTE — ED Provider Notes (Signed)
[place] EMERGENCY DEPARTMENT AT [facility] Provider Note   CSN: [id number] Arrival date & time: 11/12/23  6578     History  Chief Complaint  Patient presents with   Back Pain    Vicki [name] is a 57 y.o. female.   Back Pain Patient presents for back pain.  Medical history includes anxiety, depression, anemia, fibroids, HTN.  Back pain is located in left thoracic area.  It is worsened with palpation and movement.  Pain has been present for the past 2 days.  She has a new job in which she spends more time sitting down.  She denies any recent injuries.  She took 800 mg of ibuprofen 2 hours prior to arrival.  This did improve her symptoms.     Home Medications Prior to Admission medications   Medication Sig Start Date End Date Taking? Authorizing Provider  methocarbamol (ROBAXIN) 500 MG tablet Take 2 tablets (1,000 mg total) by mouth every 8 (eight) hours as needed for muscle spasms. 11/12/23  Yes Gloris Manchester, MD  acetaminophen (TYLENOL) 325 MG tablet Take 2 tablets (650 mg total) by mouth every 6 (six) hours as needed. 04/03/23   Sloan Leiter, DO  albuterol (VENTOLIN HFA) 108 (90 Base) MCG/ACT inhaler Inhale 2 puffs into the lungs every 4 (four) hours as needed for wheezing or shortness of breath. 07/10/23   Gilda Crease, MD  amLODipine (NORVASC) 5 MG tablet Take 1 tablet (5 mg total) by mouth daily. 03/10/21   Eber Hong, MD  aspirin 81 MG chewable tablet Chew 1 tablet (81 mg total) by mouth 2 (two) times daily. 10/06/21   Kathryne Hitch, MD  carvedilol (COREG) 3.125 MG tablet Take 1 tablet (3.125 mg total) by mouth 2 (two) times daily with a meal. 08/25/20   Idol, Raynelle Fanning, PA-C  cholecalciferol (VITAMIN D3) 25 MCG (1000 UNIT) tablet Take 1,000 Units by mouth daily.    [provider]  citalopram (CELEXA) 40 MG tablet Take 40 mg by mouth daily. 09/18/21   [provider]  cyclobenzaprine (FLEXERIL) 10 MG tablet Take 1 tablet (10 mg  total) by mouth 3 (three) times daily as needed for muscle spasms. 10/17/23   Geoffery Lyons, MD  doxycycline (VIBRAMYCIN) 100 MG capsule Take 1 capsule (100 mg total) by mouth 2 (two) times daily. 07/10/23   Gilda Crease, MD  gabapentin (NEURONTIN) 300 MG capsule Take 1 capsule (300 mg total) by mouth at bedtime. 08/23/23   Vickki Hearing, MD  hydrOXYzine (ATARAX) 25 MG tablet Take 25 mg by mouth 3 (three) times daily as needed for anxiety. 04/27/22   [provider]  ibuprofen (ADVIL) 800 MG tablet Take 1 tablet (800 mg total) by mouth every 8 (eight) hours as needed. 08/23/23   Vickki Hearing, MD  lidocaine (LIDODERM) 5 % Place 1 patch onto the skin daily. Remove & Discard patch within 12 hours or as directed by MD 10/03/23   Sabas Sous, MD  lidocaine (LIDODERM) 5 % Place 1 patch onto the skin daily for 14 days. Remove & Discard patch within 12 hours or as directed by MD 11/12/23 11/26/23  Gloris Manchester, MD  meclizine (ANTIVERT) 25 MG tablet Take 1 tablet (25 mg total) by mouth 3 (three) times daily as needed for dizziness. 11/09/18   Donnetta Hutching, MD  methocarbamol (ROBAXIN) 500 MG tablet Take 1 tablet (500 mg total) by mouth every 8 (eight) hours as needed for muscle spasms. 10/03/23  Sabas Sous, MD  naproxen (NAPROSYN) 500 MG tablet Take 1 tablet (500 mg total) by mouth 2 (two) times daily with a meal. 10/17/23   Geoffery Lyons, MD  oxyCODONE-acetaminophen (PERCOCET/ROXICET) 5-325 MG tablet Take 1 tablet by mouth every 6 (six) hours as needed for severe pain. 09/18/23   Terrilee Files, MD  predniSONE (DELTASONE) 20 MG tablet Take 2 tablets (40 mg total) by mouth daily with breakfast. Patient not taking: Reported on 08/23/2023 07/10/23   Gilda Crease, MD  simvastatin (ZOCOR) 20 MG tablet Take 20 mg by mouth daily. 03/16/22   [provider]      Allergies    Patient has no known allergies.    Review of Systems   Review of Systems   Musculoskeletal:  Positive for back pain.  All other systems reviewed and are negative.   Physical Exam Updated Vital Signs BP 133/71 (BP Location: Right Arm)   Pulse 74   Temp 98.4 F (36.9 C) (Oral)   Resp 16   Ht 5\' 7"  (1.702 m)   Wt 77.1 kg   LMP 02/09/2014   SpO2 97%   BMI 26.63 kg/m  Physical Exam Vitals and nursing note reviewed.  Constitutional:      General: She is not in acute distress.    Appearance: Normal appearance. She is well-developed. She is not ill-appearing, toxic-appearing or diaphoretic.  HENT:     Head: Normocephalic and atraumatic.     Right Ear: External ear normal.     Left Ear: External ear normal.     Nose: Nose normal.     Mouth/Throat:     Mouth: Mucous membranes are moist.  Eyes:     Extraocular Movements: Extraocular movements intact.     Conjunctiva/sclera: Conjunctivae normal.  Cardiovascular:     Rate and Rhythm: Normal rate and regular rhythm.  Pulmonary:     Effort: Pulmonary effort is normal. No respiratory distress.  Abdominal:     General: There is no distension.     Palpations: Abdomen is soft.     Tenderness: There is no abdominal tenderness.  Musculoskeletal:        General: Tenderness present. No swelling or deformity. Normal range of motion.     Cervical back: Normal range of motion and neck supple.  Skin:    General: Skin is warm and dry.     Coloration: Skin is not jaundiced or pale.  Neurological:     General: No focal deficit present.     Mental Status: She is alert and oriented to person, place, and time.  Psychiatric:        Mood and Affect: Mood normal.        Behavior: Behavior normal.     ED Results / Procedures / Treatments   Labs (all labs ordered are listed, but only abnormal results are displayed) Labs Reviewed - No data to display  EKG None  Radiology DG Ribs Unilateral W/Chest Left  Result Date: 11/12/2023 CLINICAL DATA:  Pain.  Back pain to middle, left back for 2 days. EXAM: LEFT RIBS  AND CHEST - 3+ VIEW COMPARISON:  None Available. FINDINGS: Bilateral lung fields are clear. Bilateral costophrenic angles are clear. Normal cardio-mediastinal silhouette. No acute osseous abnormalities. No acute rib fracture or focal rib lesion. The soft tissues are within normal limits. IMPRESSION: *No acute cardiopulmonary abnormality. No acute rib fracture or focal rib lesion. Electronically Signed   By: Jules Schick M.D.   On: 11/12/2023  10:46    Procedures Procedures    Medications Ordered in ED Medications  lidocaine (LIDODERM) 5 % 1 patch (1 patch Transdermal Patch Applied 11/12/23 0942)  methocarbamol (ROBAXIN) tablet 1,000 mg (1,000 mg Oral Given 11/12/23 4098)    ED Course/ Medical Decision Making/ A&P                                 Medical Decision Making Amount and/or Complexity of Data Reviewed Radiology: ordered.  Risk Prescription drug management.   Patient presenting for left thoracic back pain that has been present for the past 2 days.  Pain is worsened with movements and palpation.  It is reproducible on exam.  Current breathing is unlabored.  She denies any other areas of discomfort.  I suspect musculoskeletal etiology.  She did take ibuprofen prior to arrival.  Lidocaine patch and Robaxin were ordered.  X-ray showed no acute findings.  On reassessment, symptoms improved.  She was discharged in good condition.        Final Clinical Impression(s) / ED Diagnoses Final diagnoses:  Acute left-sided thoracic back pain    Rx / DC Orders ED Discharge Orders          Ordered    lidocaine (LIDODERM) 5 %  Every 24 hours,   Status:  Discontinued        11/12/23 1049    methocarbamol (ROBAXIN) 500 MG tablet  Every 8 hours PRN        11/12/23 1049    lidocaine (LIDODERM) 5 %  Every 24 hours        11/12/23 1050              Gloris Manchester, MD 11/12/23 1050

## 2023-11-12 NOTE — Discharge Instructions (Signed)
Continue ibuprofen and Tylenol.  Prescriptions for muscle relaxer and lidocaine patches were sent to your pharmacy.  Lidocaine patches can be switched daily.  Return to the emergency department for any new or worsening symptoms of concern.

## 2023-12-04 ENCOUNTER — Emergency Department (HOSPITAL_COMMUNITY): Payer: Self-pay

## 2023-12-04 ENCOUNTER — Other Ambulatory Visit: Payer: Self-pay

## 2023-12-04 ENCOUNTER — Encounter (HOSPITAL_COMMUNITY): Payer: Self-pay | Admitting: *Deleted

## 2023-12-04 ENCOUNTER — Emergency Department (HOSPITAL_COMMUNITY)
Admission: EM | Admit: 2023-12-04 | Discharge: 2023-12-04 | Disposition: A | Discharge status: Home / Self Care | Payer: Self-pay | Attending: Emergency Medicine | Admitting: Emergency Medicine

## 2023-12-04 DIAGNOSIS — R600 Localized edema: Secondary | ICD-10-CM | POA: Insufficient documentation

## 2023-12-04 DIAGNOSIS — Z7982 Long term (current) use of aspirin: Secondary | ICD-10-CM | POA: Insufficient documentation

## 2023-12-04 LAB — CBC WITH DIFFERENTIAL/PLATELET
Abs Immature Granulocytes: 0.02 10*3/uL (ref 0.00–0.07)
Basophils Absolute: 0 10*3/uL (ref 0.0–0.1)
Basophils Relative: 1 %
Eosinophils Absolute: 0.1 10*3/uL (ref 0.0–0.5)
Eosinophils Relative: 2 %
HCT: 44.4 % (ref 36.0–46.0)
Hemoglobin: 14.7 g/dL (ref 12.0–15.0)
Immature Granulocytes: 0 %
Lymphocytes Relative: 35 %
Lymphs Abs: 2.4 10*3/uL (ref 0.7–4.0)
MCH: 28.2 pg (ref 26.0–34.0)
MCHC: 33.1 g/dL (ref 30.0–36.0)
MCV: 85.1 fL (ref 80.0–100.0)
Monocytes Absolute: 0.5 10*3/uL (ref 0.1–1.0)
Monocytes Relative: 7 %
Neutro Abs: 3.8 10*3/uL (ref 1.7–7.7)
Neutrophils Relative %: 55 %
Platelets: 247 10*3/uL (ref 150–400)
RBC: 5.22 MIL/uL — ABNORMAL HIGH (ref 3.87–5.11)
RDW: 14.8 % (ref 11.5–15.5)
WBC: 6.9 10*3/uL (ref 4.0–10.5)
nRBC: 0 % (ref 0.0–0.2)

## 2023-12-04 LAB — HEPATIC FUNCTION PANEL
ALT: 25 U/L (ref 0–44)
AST: 27 U/L (ref 15–41)
Albumin: 4.4 g/dL (ref 3.5–5.0)
Alkaline Phosphatase: 91 U/L (ref 38–126)
Bilirubin, Direct: 0.1 mg/dL (ref 0.0–0.2)
Indirect Bilirubin: 0.4 mg/dL (ref 0.3–0.9)
Total Bilirubin: 0.5 mg/dL (ref <1.2)
Total Protein: 8.1 g/dL (ref 6.5–8.1)

## 2023-12-04 LAB — BASIC METABOLIC PANEL
Anion gap: 10 (ref 5–15)
BUN: 13 mg/dL (ref 6–20)
CO2: 22 mmol/L (ref 22–32)
Calcium: 9.7 mg/dL (ref 8.9–10.3)
Chloride: 107 mmol/L (ref 98–111)
Creatinine, Ser: 0.95 mg/dL (ref 0.44–1.00)
GFR, Estimated: >60 mL/min (ref >60)
Glucose, Bld: 92 mg/dL (ref 70–99)
Potassium: 4.2 mmol/L (ref 3.5–5.1)
Sodium: 139 mmol/L (ref 135–145)

## 2023-12-04 LAB — TROPONIN I (HIGH SENSITIVITY): Troponin I (High Sensitivity): 3 ng/L (ref <18)

## 2023-12-04 LAB — BRAIN NATRIURETIC PEPTIDE: B Natriuretic Peptide: 58 pg/mL (ref 0.0–100.0)

## 2023-12-04 MED ORDER — POTASSIUM CHLORIDE CRYS ER 20 MEQ PO TBCR: 20.0000 meq | EXTENDED_RELEASE_TABLET | Freq: Every day | ORAL | 0 refills | Status: DC

## 2023-12-04 MED ORDER — FUROSEMIDE 40 MG PO TABS
20.0000 mg | ORAL_TABLET | Freq: Once | ORAL | Status: AC
  Administered 2023-12-04: 20 mg via ORAL
  Filled 2023-12-04: qty 1

## 2023-12-04 MED ORDER — FUROSEMIDE 20 MG PO TABS: 20.0000 mg | ORAL_TABLET | Freq: Every day | ORAL | 0 refills | Status: DC

## 2023-12-04 NOTE — ED Provider Notes (Signed)
[place] EMERGENCY DEPARTMENT AT [facility] Provider Note   CSN: [id number] Arrival date & time: 12/04/23  1233     History  Chief Complaint  Patient presents with   Leg Swelling   HPI Vicki [name] is a 57 y.o. female presenting for leg swelling.  It is bilateral and started yesterday.  Denies calf tenderness at this time.  Endorses some intermittent shortness of breath but no chest pain.  Denies urinary changes.  HPI     Home Medications Prior to Admission medications   Medication Sig Start Date End Date Taking? Authorizing Provider  furosemide (LASIX) 20 MG tablet Take 1 tablet (20 mg total) by mouth daily for 5 days. 12/04/23 12/09/23 Yes Gareth Eagle, PA-C  potassium chloride SA (KLOR-CON M) 20 MEQ tablet Take 1 tablet (20 mEq total) by mouth daily. 12/04/23  Yes Gareth Eagle, PA-C  acetaminophen (TYLENOL) 325 MG tablet Take 2 tablets (650 mg total) by mouth every 6 (six) hours as needed. 04/03/23   Sloan Leiter, DO  albuterol (VENTOLIN HFA) 108 (90 Base) MCG/ACT inhaler Inhale 2 puffs into the lungs every 4 (four) hours as needed for wheezing or shortness of breath. 07/10/23   Gilda Crease, MD  amLODipine (NORVASC) 5 MG tablet Take 1 tablet (5 mg total) by mouth daily. 03/10/21   Eber Hong, MD  aspirin 81 MG chewable tablet Chew 1 tablet (81 mg total) by mouth 2 (two) times daily. 10/06/21   Kathryne Hitch, MD  carvedilol (COREG) 3.125 MG tablet Take 1 tablet (3.125 mg total) by mouth 2 (two) times daily with a meal. 08/25/20   Idol, Raynelle Fanning, PA-C  cholecalciferol (VITAMIN D3) 25 MCG (1000 UNIT) tablet Take 1,000 Units by mouth daily.    [provider]  citalopram (CELEXA) 40 MG tablet Take 40 mg by mouth daily. 09/18/21   [provider]  cyclobenzaprine (FLEXERIL) 10 MG tablet Take 1 tablet (10 mg total) by mouth 3 (three) times daily as needed for muscle spasms. 10/17/23   Geoffery Lyons, MD  doxycycline (VIBRAMYCIN)  100 MG capsule Take 1 capsule (100 mg total) by mouth 2 (two) times daily. 07/10/23   Gilda Crease, MD  gabapentin (NEURONTIN) 300 MG capsule Take 1 capsule (300 mg total) by mouth at bedtime. 08/23/23   Vickki Hearing, MD  hydrOXYzine (ATARAX) 25 MG tablet Take 25 mg by mouth 3 (three) times daily as needed for anxiety. 04/27/22   [provider]  ibuprofen (ADVIL) 800 MG tablet Take 1 tablet (800 mg total) by mouth every 8 (eight) hours as needed. 08/23/23   Vickki Hearing, MD  lidocaine (LIDODERM) 5 % Place 1 patch onto the skin daily. Remove & Discard patch within 12 hours or as directed by MD 10/03/23   Sabas Sous, MD  meclizine (ANTIVERT) 25 MG tablet Take 1 tablet (25 mg total) by mouth 3 (three) times daily as needed for dizziness. 11/09/18   Donnetta Hutching, MD  methocarbamol (ROBAXIN) 500 MG tablet Take 1 tablet (500 mg total) by mouth every 8 (eight) hours as needed for muscle spasms. 10/03/23   Sabas Sous, MD  methocarbamol (ROBAXIN) 500 MG tablet Take 2 tablets (1,000 mg total) by mouth every 8 (eight) hours as needed for muscle spasms. 11/12/23   Gloris Manchester, MD  naproxen (NAPROSYN) 500 MG tablet Take 1 tablet (500 mg total) by mouth 2 (two) times daily with a meal. 10/17/23   Delo,  Riley Lam, MD  oxyCODONE-acetaminophen (PERCOCET/ROXICET) 5-325 MG tablet Take 1 tablet by mouth every 6 (six) hours as needed for severe pain. 09/18/23   Terrilee Files, MD  predniSONE (DELTASONE) 20 MG tablet Take 2 tablets (40 mg total) by mouth daily with breakfast. Patient not taking: Reported on 08/23/2023 07/10/23   Gilda Crease, MD  simvastatin (ZOCOR) 20 MG tablet Take 20 mg by mouth daily. 03/16/22   [provider]      Allergies    Patient has no known allergies.    Review of Systems   See HPI   Physical Exam   Vitals:   12/04/23 1239  BP: (!) 155/77  Pulse: 74  Resp: 15  Temp: 98.6 F (37 C)  SpO2: 99%    CONSTITUTIONAL:   well-appearing, NAD NEURO:  Alert and oriented x 3, CN 3-12 grossly intact EYES:  eyes equal and reactive ENT/NECK:  Supple, no stridor  CARDIO:  regular rate and rhythm, appears well-perfused  PULM:  No respiratory distress, CTAB GI/GU:  non-distended, soft MSK/SPINE:  No gross deformities, mild 1+ nonpitting edema in both lower extremities primarily around the ankles.  Pain all extremities. SKIN:  no rash, atraumatic  *Additional and/or pertinent findings included in MDM below  ED Results / Procedures / Treatments   Labs (all labs ordered are listed, but only abnormal results are displayed) Labs Reviewed  CBC WITH DIFFERENTIAL/PLATELET - Abnormal; Notable for the following components:      Result Value   RBC 5.22 (*)    All other components within normal limits  BASIC METABOLIC PANEL  HEPATIC FUNCTION PANEL  BRAIN NATRIURETIC PEPTIDE  TROPONIN I (HIGH SENSITIVITY)    EKG None  Radiology US Venous Img Lower Bilateral  Result Date: 12/04/2023 CLINICAL DATA:  Leg swelling EXAM: Bilateral LOWER EXTREMITY VENOUS DOPPLER ULTRASOUND TECHNIQUE: Gray-scale sonography with compression, as well as color and duplex ultrasound, were performed to evaluate the deep venous system(s) from the level of the common femoral vein through the popliteal and proximal calf veins. COMPARISON:  None Available. FINDINGS: VENOUS Normal compressibility of the common femoral, superficial femoral, and popliteal veins, as well as the visualized calf veins. Visualized portions of profunda femoral vein and great saphenous vein unremarkable. No filling defects to suggest DVT on grayscale or color Doppler imaging. Doppler waveforms show normal direction of venous flow, normal respiratory plasticity and response to augmentation OTHER None. Limitations: none IMPRESSION: No evidence of bilateral lower extremity DVT. Electronically Signed   By: Karen Kays M.D.   On: 12/04/2023 17:27   DG Chest 2 View  Result Date:  12/04/2023 CLINICAL DATA:  Peripheral edema beginning yesterday. EXAM: CHEST - 2 VIEW COMPARISON:  11/12/2023 FINDINGS: The heart size and mediastinal contours are within normal limits. Both lungs are clear. The visualized skeletal structures are unremarkable. IMPRESSION: No active cardiopulmonary disease. Electronically Signed   By: Danae Orleans M.D.   On: 12/04/2023 17:13    Procedures Procedures    Medications Ordered in ED Medications  furosemide (LASIX) tablet 20 mg (has no administration in time range)    ED Course/ Medical Decision Making/ A&P                                 Medical Decision Making Amount and/or Complexity of Data Reviewed Labs: ordered.  57 year old well-appearing female present for bilateral lower extreme edema.  Exam was notable for some mild edema  in the lower extremities.  DDx includes CHF, DVT, renal dysfunction, venous stasis, other.  Labs were unremarkable.  Ultrasound was negative for DVT.  X-ray without acute findings.  Personally reviewed interpret EKG which revealed sinus rhythm.  Suspect mild venous stasis.  Treated with Lasix.  Advised to follow-up with her PCP.  Vital stable.  Discussed return precautions.  Discharged in good condition.        Final Clinical Impression(s) / ED Diagnoses Final diagnoses:  Bilateral lower extremity edema    Rx / DC Orders ED Discharge Orders          Ordered    furosemide (LASIX) 20 MG tablet  Daily        12/04/23 1954    potassium chloride SA (KLOR-CON M) 20 MEQ tablet  Daily        12/04/23 1954              Vaughan Browner 12/04/23 1955    Rondel Baton, MD 12/05/23 [phone]

## 2023-12-04 NOTE — Discharge Instructions (Addendum)
Evaluation today was overall reassuring.  Recommend you follow-up with your PCP.  If you develop worsening shortness of breath, chest pain, worsening edema or any other concerning symptom please return to the emergency department for further evaluation.  I am starting you on a 5-day course of Lasix to help treat the swelling in your legs.  Expect that you will be urinating more than usual.

## 2023-12-04 NOTE — ED Provider Triage Note (Signed)
Emergency Medicine Provider Triage Evaluation Note  Vicki [name] , a 57 y.o. female  was evaluated in triage.  Pt complains of lower extremity swelling, shortness of breath.  Patient noted's symptoms yesterday.  Denies any personal history of heart failure or family history.  Denies any chest pain.  Review of Systems  Positive: Above Negative:   Physical Exam  BP (!) 155/77 (BP Location: Right Arm)   Pulse 74   Temp 98.6 F (37 C)   Resp 15   Ht 5\' 7"  (1.702 m)   Wt 77.1 kg   LMP 02/09/2014   SpO2 99%   BMI 26.63 kg/m  Gen:   Awake, no distress   Resp:  Normal effort  MSK:   Moves extremities without difficulty  Other:  Lower extremity edema bilaterally.  Medical Decision Making  Medically screening exam initiated at 4:17 PM.  Appropriate orders placed.  Margarita Rana was informed that the remainder of the evaluation will be completed by another provider, this initial triage assessment does not replace that evaluation, and the importance of remaining in the ED until their evaluation is complete.     Peter Garter, [place] 12/04/23 [phone]

## 2023-12-04 NOTE — ED Triage Notes (Signed)
Pt c/o bilateral feet and let swelling that started yesterday

## 2024-01-01 ENCOUNTER — Emergency Department (HOSPITAL_COMMUNITY)
Admission: EM | Admit: 2024-01-01 | Discharge: 2024-01-01 | Disposition: A | Discharge status: Home / Self Care | Payer: BC Managed Care – PPO | Attending: Emergency Medicine | Admitting: Emergency Medicine

## 2024-01-01 ENCOUNTER — Other Ambulatory Visit: Payer: Self-pay

## 2024-01-01 DIAGNOSIS — G8929 Other chronic pain: Secondary | ICD-10-CM | POA: Diagnosis not present

## 2024-01-01 DIAGNOSIS — M25551 Pain in right hip: Secondary | ICD-10-CM | POA: Diagnosis not present

## 2024-01-01 DIAGNOSIS — Z79899 Other long term (current) drug therapy: Secondary | ICD-10-CM | POA: Diagnosis not present

## 2024-01-01 DIAGNOSIS — Z7982 Long term (current) use of aspirin: Secondary | ICD-10-CM | POA: Diagnosis not present

## 2024-01-01 DIAGNOSIS — M545 Low back pain, unspecified: Secondary | ICD-10-CM | POA: Diagnosis present

## 2024-01-01 MED ORDER — FUROSEMIDE 20 MG PO TABS: 20.0000 mg | ORAL_TABLET | Freq: Every day | ORAL | 0 refills | Status: DC

## 2024-01-01 MED ORDER — PREDNISONE 20 MG PO TABS: 40.0000 mg | ORAL_TABLET | Freq: Every day | ORAL | 0 refills | Status: DC

## 2024-01-01 NOTE — ED Triage Notes (Addendum)
 Pt c/o lower right back pain that radiates to hip. Pt ambulatory in triage

## 2024-01-01 NOTE — Discharge Instructions (Signed)
 You will need to see your orthopedic surgeon for further evaluation of your chronic hip pain, take prednisone  for 5 days, stay out of work today, ER for fevers or worsening symptoms   Prednisone  is a steroid that helps to reduce certain types of inflammation and may be used for allergic reactions, some rashes such as poison ivy or dermatitis, for asthma attacks or bronchitis and for certain types of pain.  Please take this medicine exactly as prescribed - 40mg  by mouth daily for 5 days.  This can have certain side effects with some people including feeling like you can't sleep, feeling anxious or feeling like you are on a high.  It should not cause weight gain if only taken for a short time.  Please be aware that this medication may also cause an elevation in your blood sugar if you are a diabetic so if you are a diabetic you will need to keep a very close eye on your blood sugar, make sure that you are eating an extremely low level of carbohydrates and taking your medications exactly as prescribed.  If you should develop severe high blood sugar or start to feel poorly return to the emergency department immediately   Thank you for allowing us  to treat you in the emergency department today.  After reviewing your examination and potential testing that was done it appears that you are safe to go home.  I would like for you to follow-up with your doctor within the next several days, have them obtain your records and follow-up with them to review all potential tests and results from your visit.  If you should develop severe or worsening symptoms return to the emergency department immediately

## 2024-01-01 NOTE — ED Provider Notes (Signed)
 [place] EMERGENCY DEPARTMENT AT [facility] Provider Note   CSN: [id number] Arrival date & time: 01/01/24  0502     History  Chief Complaint  Patient presents with   Back Pain    Vicki [name] is a 58 y.o. female.   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  She is supposed to have a replacement on the right side but this is not yet scheduled.  She suffers with some intermittent fluctuating and worsening pain at times.  No fevers or chills, she has some associated right lower back pain, takes tizanidine  prescribed by her doctor    Home Medications Prior to Admission medications   Medication Sig Start Date End Date Taking? Authorizing Provider  predniSONE  (DELTASONE ) 20 MG tablet Take 2 tablets (40 mg total) by mouth daily. 01/01/24  Yes Cleotilde Rogue, MD  albuterol  (VENTOLIN  HFA) [phone] Base) MCG/ACT inhaler Inhale 2 puffs into the lungs every 4 (four) hours as needed for wheezing or shortness of breath. 07/10/23   Haze Lonni PARAS, MD  amLODipine  (NORVASC ) 5 MG tablet Take 1 tablet (5 mg total) by mouth daily. 03/10/21   Cleotilde Rogue, MD  aspirin  81 MG chewable tablet Chew 1 tablet (81 mg total) by mouth 2 (two) times daily. 10/06/21   Vernetta Lonni GRADE, MD  carvedilol  (COREG ) 3.125 MG tablet Take 1 tablet (3.125 mg total) by mouth 2 (two) times daily with a meal. 08/25/20   Idol, Mliss, PA-C  cholecalciferol  (VITAMIN D3) 25 MCG (1000 UNIT) tablet Take 1,000 Units by mouth daily.    [provider]  citalopram  (CELEXA ) 40 MG tablet Take 40 mg by mouth daily. 09/18/21   [provider]  furosemide  (LASIX ) 20 MG tablet Take 1 tablet (20 mg total) by mouth daily. 01/01/24 01/31/24  Cleotilde Rogue, MD  gabapentin  (NEURONTIN ) 300 MG capsule Take 1 capsule (300 mg total) by mouth at bedtime. 08/23/23   Margrette Taft BRAVO, MD  hydrOXYzine  (ATARAX ) 25 MG tablet Take 25 mg by mouth 3 (three) times daily as needed for anxiety. 04/27/22   [provider]  ibuprofen  (ADVIL ) 800 MG tablet Take 1 tablet (800 mg total) by mouth every 8 (eight) hours as needed. 08/23/23   Margrette Taft BRAVO, MD  lidocaine  (LIDODERM ) 5 % Place 1 patch onto the skin daily. Remove & Discard patch within 12 hours or as directed by MD 10/03/23   Theadore Ozell HERO, MD  simvastatin (ZOCOR) 20 MG tablet Take 20 mg by mouth daily. 03/16/22   [provider]      Allergies    Patient has no known allergies.    Review of Systems   Review of Systems  Musculoskeletal:  Positive for back pain.  All other systems reviewed and are negative.   Physical Exam Updated Vital Signs BP (!) 152/80   Pulse 80   Temp 98.3 F (36.8 C) (Oral)   Resp 18   LMP 02/09/2014   SpO2 98%  Physical Exam Vitals and nursing note reviewed.  Constitutional:      General: She is not in acute distress.    Appearance: She is well-developed.  HENT:     Head: Normocephalic and atraumatic.     Mouth/Throat:     Pharynx: No oropharyngeal exudate.  Eyes:  General: No scleral icterus.       Right eye: No discharge.        Left eye: No discharge.     Conjunctiva/sclera: Conjunctivae normal.     Pupils: Pupils are equal, round, and reactive to light.  Neck:     Thyroid : No thyromegaly.     Vascular: No JVD.  Cardiovascular:     Rate and Rhythm: Normal rate and regular rhythm.     Heart sounds: Normal heart sounds. No murmur heard.    No friction rub. No gallop.  Pulmonary:     Effort: Pulmonary effort is normal. No respiratory distress.     Breath sounds: Normal breath sounds. No wheezing or rales.  Abdominal:     General: Bowel sounds are normal. There is no distension.     Palpations: Abdomen is soft. There is no mass.     Tenderness: There is no abdominal tenderness.  Musculoskeletal:        General: Tenderness present. Normal  range of motion.     Cervical back: Normal range of motion and neck supple.     Right lower leg: No edema.     Left lower leg: No edema.     Comments: Mild tenderness right lower back, good range of motion of the right hip but with mild discomfort, normal sensation and motor to the bilateral lower extremities, normal gait, normal patellar reflexes bilaterally  Lymphadenopathy:     Cervical: No cervical adenopathy.  Skin:    General: Skin is warm and dry.     Findings: No erythema or rash.  Neurological:     Mental Status: She is alert.     Coordination: Coordination normal.  Psychiatric:        Behavior: Behavior normal.     ED Results / Procedures / Treatments   Labs (all labs ordered are listed, but only abnormal results are displayed) Labs Reviewed - No data to display  EKG None  Radiology No results found.  Procedures Procedures    Medications Ordered in ED Medications - No data to display  ED Course/ Medical Decision Making/ A&P                                 Medical Decision Making Risk Prescription drug management.   Exam is consistent with musculoskeletal type pain, doubt septic joint, will give course of prednisone , she has follow-up, we will keep her out of work today.  The patient is agreeable to the plan and understands indications for return.  Doubt urinary infection or kidney stone no signs of shingles, no history of trauma to suggest that this is a bony abnormality of her spine        Final Clinical Impression(s) / ED Diagnoses Final diagnoses:  Acute right-sided low back pain without sciatica  Chronic hip pain, right    Rx / DC Orders ED Discharge Orders          Ordered    furosemide  (LASIX ) 20 MG tablet  Daily        01/01/24 0725    predniSONE  (DELTASONE ) 20 MG tablet  Daily        01/01/24 0730              Cleotilde Rogue, MD 01/01/24 [phone]

## 2024-01-28 ENCOUNTER — Telehealth: Payer: Self-pay | Admitting: Orthopedic Surgery

## 2024-01-28 NOTE — Telephone Encounter (Signed)
Returned the patient's call, lvm for her to cb, she is wanting to schedule an appointment w/Dr. Rexene Edison.

## 2024-01-30 NOTE — Progress Notes (Signed)
   BP (!) 169/101   Pulse 96   Ht 5' 7 (1.702 m)   Wt 171 lb (77.6 kg)   LMP 02/09/2014   BMI 26.78 kg/m   Body mass index is 26.78 kg/m.  Chief Complaint  Patient presents with   Hip Pain    Right comes and goes/ states pain groin     No diagnosis found.  DOI/DOS/ Date: NA  Worse

## 2024-01-31 ENCOUNTER — Ambulatory Visit (INDEPENDENT_AMBULATORY_CARE_PROVIDER_SITE_OTHER): Payer: Self-pay

## 2024-01-31 ENCOUNTER — Encounter: Payer: Self-pay | Admitting: Orthopedic Surgery

## 2024-01-31 ENCOUNTER — Ambulatory Visit: Payer: BC Managed Care – PPO | Admitting: Orthopedic Surgery

## 2024-01-31 VITALS — BP 169/101 | HR 96 | Ht 67.0 in | Wt 171.0 lb

## 2024-01-31 DIAGNOSIS — M1611 Unilateral primary osteoarthritis, right hip: Secondary | ICD-10-CM

## 2024-01-31 DIAGNOSIS — M25551 Pain in right hip: Secondary | ICD-10-CM

## 2024-01-31 DIAGNOSIS — M5417 Radiculopathy, lumbosacral region: Secondary | ICD-10-CM | POA: Diagnosis not present

## 2024-01-31 DIAGNOSIS — M545 Low back pain, unspecified: Secondary | ICD-10-CM

## 2024-01-31 MED ORDER — GABAPENTIN 300 MG PO CAPS: 300.0000 mg | ORAL_CAPSULE | Freq: Every day | ORAL | 2 refills | Status: DC

## 2024-01-31 NOTE — Patient Instructions (Signed)
 Out of work Feb 4th, return Feb 10

## 2024-01-31 NOTE — Progress Notes (Signed)
 Patient: Vicki [name]           Date of Birth: 25-Jun-1966           MRN: [id number] Visit Date: 01/31/2024 Requested by: Luke Agent, MD [address] [place],  [place] [address] PCP: Luke Agent, MD   Chief Complaint  Patient presents with   Hip Pain    Right comes and goes/ states pain groin    Encounter Diagnoses  Name Primary?   Primary osteoarthritis of right hip Yes   Lumbar pain    Lumbosacral radiculopathy at L4     Plan:  Continue tizanidine  to address any symptoms that are back related and resume gabapentin   Meds ordered this encounter  Medications   gabapentin  (NEURONTIN ) 300 MG capsule    Sig: Take 1 capsule (300 mg total) by mouth at bedtime.    Dispense:  30 capsule    Refill:  2    Chief Complaint  Patient presents with   Hip Pain    Right comes and goes/ states pain groin     58 year old female with back and hip pain history of left total hip for AVN presented to the ER with back and hip pain more back than hip presents today with lateral hip pain radiates into the groin.  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   Problem list, medical hx, medications and allergies reviewed   Review of Systems  Constitutional:  Negative for chills, fever, malaise/fatigue and weight loss.  Gastrointestinal:  Negative for constipation.       Denies loss bowel control   Genitourinary:        Denies urinary retention or los of bladder control      No Known Allergies  BP (!) 169/101   Pulse 96   Ht 5' 7 (1.702 m)   Wt 171 lb (77.6 kg)   LMP 02/09/2014   BMI 26.78 kg/m    Physical exam: Physical Exam Vitals and nursing note reviewed.  Constitutional:      Appearance: Normal  appearance.  HENT:     Head: Normocephalic and atraumatic.  Eyes:     General: No scleral icterus.       Right eye: No discharge.        Left eye: No discharge.     Extraocular Movements: Extraocular movements intact.     Conjunctiva/sclera: Conjunctivae normal.     Pupils: Pupils are equal, round, and reactive to light.  Cardiovascular:     Rate and Rhythm: Normal rate.     Pulses: Normal pulses.  Skin:    General: Skin is warm and dry.     Capillary Refill: Capillary refill takes less than 2 seconds.  Neurological:     General: No focal deficit present.     Mental Status: She is alert and oriented to person, place, and time.     Gait: Gait normal.  Psychiatric:        Mood and Affect: Mood normal.        Behavior: Behavior normal.        Thought Content: Thought content normal.        Judgment:  Judgment normal.     Right Hip Exam  Right hip exam is normal.    Left Hip Exam  Left hip exam is normal.   Back Exam   Tenderness  The patient is experiencing tenderness in the lumbar (Right lower back).  Range of Motion  Extension:  normal  Flexion:  normal       MSK:  Pain with abduction of the right hip.  No pain with flexion of the hip no pain with internal rotation of the hip no pain with external rotation of the hip  Palpable tenderness right lower back none on the midline none on the left side no pain with flexion just pulling in the back no pain with extension   Data reviewed:   Image(s) reviewed with personal interpretation:   DG HIP UNILAT WITH PELVIS 2-3 VIEWS RIGHT Result Date: 01/31/2024 Right hip Right hip pain Pelvis AP lateral right hip Normal femoral head normal acetabulum See MRI from a couple years ago as well No evidence of arthritis in the right hip     Assessment and plan:  Encounter Diagnoses  Name Primary?   Primary osteoarthritis of right hip Yes   Lumbar pain    Lumbosacral radiculopathy at L4     DG HIP UNILAT WITH PELVIS 2-3  VIEWS RIGHT Result Date: 01/31/2024 Right hip Right hip pain Pelvis AP lateral right hip Normal femoral head normal acetabulum See MRI from a couple years ago as well No evidence of arthritis in the right hip      Meds ordered this encounter  Medications   gabapentin  (NEURONTIN ) 300 MG capsule    Sig: Take 1 capsule (300 mg total) by mouth at bedtime.    Dispense:  30 capsule    Refill:  2    Procedures:   None

## 2024-02-21 ENCOUNTER — Ambulatory Visit
Admission: EM | Admit: 2024-02-21 | Discharge: 2024-02-21 | Disposition: A | Discharge status: Home / Self Care | Payer: BC Managed Care – PPO | Attending: Family Medicine | Admitting: Family Medicine

## 2024-02-21 DIAGNOSIS — R600 Localized edema: Secondary | ICD-10-CM

## 2024-02-21 MED ORDER — FUROSEMIDE 20 MG PO TABS: 20.0000 mg | ORAL_TABLET | Freq: Every day | ORAL | 0 refills | Status: DC

## 2024-02-21 NOTE — Discharge Instructions (Signed)
 Wear compression stockings daily, elevate your legs at rest, drink plenty of fluids and eat a low-sodium diet.  I have sent over 1 week of fluid pills to help additionally.  Follow-up with your primary care provider soon as possible

## 2024-02-21 NOTE — ED Triage Notes (Signed)
 Pt reports she has bilateral feet pain and swelling x 1 day

## 2024-02-21 NOTE — ED Provider Notes (Signed)
 [facility]    CSN: [id number] Arrival date & time: 02/21/24  1649      History   Chief Complaint No chief complaint on file.   HPI Vicki [name] is a 58 y.o. female.   Presenting today with 1 day history of bilateral foot and lower leg swelling, tenderness diffusely.  Denies redness, point tenderness, injury to the area, numbness, tingling, weakness, chest pain, palpitations, shortness of breath, dizziness.  So far not tried anything over-the-counter for symptoms.  Has had this issue numerous times in the past and has been on Lasix here and there for this.  Per chart review recent ED visit from 11/2024 with full workup of the issue including negative DVT ultrasound, negative chest x-ray, negative BNP and other stable findings.  Does not wear compression stockings.    Past Medical History:  Diagnosis Date   Abnormal Pap smear    Anemia    Anxiety 2020   BV (bacterial vaginosis) 05/16/2013   Depression 2020   Dysrhythmia    Fibroids    uterine   Hypertension    Mental disorder    depression and panic attacks   Tachycardia 2018   pt states for the past 4 years she has went "in and out" of tachycardia    Patient Active Problem List   Diagnosis Date Noted   Low back pain 02/01/2023   Mild major depression, single episode (HCC) 02/23/2022   Avascular necrosis of hip, left (HCC) 10/06/2021   Status post left hip replacement 10/06/2021   Noncompliance with medication regimen 05/03/2021   S/P abdominal hysterectomy 02/25/2014   Depression 07/25/2013   Panic attacks 07/25/2013   Fibroids 05/16/2013   BV (bacterial vaginosis) 05/16/2013   Anemia 04/03/2012   Menorrhagia 04/03/2012   Hypokalemia 04/03/2012   Lower urinary tract infectious disease 04/03/2012   Emesis 04/03/2012    Past Surgical History:  Procedure Laterality Date   ABDOMINAL HYSTERECTOMY     BILATERAL SALPINGECTOMY Bilateral 02/25/2014   Procedure: BILATERAL SALPINGECTOMY;  Surgeon: Lazaro Arms, MD;  Location: [facility];  Service: Gynecology;  Laterality: Bilateral;   DILATION AND CURETTAGE OF UTERUS     SUPRACERVICAL ABDOMINAL HYSTERECTOMY N/A 02/25/2014   Procedure: HYSTERECTOMY SUPRACERVICAL ABDOMINAL;  Surgeon: Lazaro Arms, MD;  Location: [facility];  Service: Gynecology;  Laterality: N/A;   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   Procedure: LEFT TOTAL HIP ARTHROPLASTY ANTERIOR APPROACH;  Surgeon: Kathryne Hitch, MD;  Location: [facility];  Service: Orthopedics;  Laterality: Left;   TUBAL LIGATION      OB History     Gravida  7   Para  5   Term      Preterm      AB  2   Living  5      SAB  2   IAB      Ectopic      Multiple      Live Births               Home Medications    Prior to Admission medications   Medication Sig Start Date End Date Taking? Authorizing Provider  albuterol (VENTOLIN HFA) 108 (90 Base) MCG/ACT inhaler Inhale 2 puffs into the lungs every 4 (four) hours as needed for wheezing or shortness of breath. 07/10/23   Gilda Crease, MD  amLODipine (NORVASC) 5 MG tablet Take 1 tablet (5 mg total) by mouth daily. 03/10/21   Eber Hong, MD  aspirin  81 MG chewable tablet Chew 1 tablet (81 mg total) by mouth 2 (two) times daily. 10/06/21   Kathryne Hitch, MD  carvedilol (COREG) 3.125 MG tablet Take 1 tablet (3.125 mg total) by mouth 2 (two) times daily with a meal. 08/25/20   Idol, Raynelle Fanning, PA-C  cholecalciferol (VITAMIN D3) 25 MCG (1000 UNIT) tablet Take 1,000 Units by mouth daily.    [provider]  citalopram (CELEXA) 40 MG tablet Take 40 mg by mouth daily. 09/18/21   [provider]  furosemide (LASIX) 20 MG tablet Take 1 tablet (20 mg total) by mouth daily. 02/21/24 03/22/24  Particia Nearing, PA-C  gabapentin (NEURONTIN) 300 MG capsule Take 1 capsule (300 mg total) by mouth at bedtime. 01/31/24   Vickki Hearing, MD  hydrOXYzine (ATARAX) 25 MG tablet Take 25 mg by mouth 3 (three) times daily  as needed for anxiety. 04/27/22   [provider]  ibuprofen (ADVIL) 800 MG tablet Take 1 tablet (800 mg total) by mouth every 8 (eight) hours as needed. 08/23/23   Vickki Hearing, MD  lidocaine (LIDODERM) 5 % Place 1 patch onto the skin daily. Remove & Discard patch within 12 hours or as directed by MD 10/03/23   Sabas Sous, MD  predniSONE (DELTASONE) 20 MG tablet Take 2 tablets (40 mg total) by mouth daily. 01/01/24   Eber Hong, MD  simvastatin (ZOCOR) 20 MG tablet Take 20 mg by mouth daily. 03/16/22   [provider]  tiZANidine (ZANAFLEX) 4 MG tablet Take 4 mg by mouth every 6 (six) hours as needed for muscle spasms.    [provider]    Family History Family History  Problem Relation Age of Onset   Congestive Heart Failure Mother    Congestive Heart Failure Maternal Grandmother    Cancer Paternal Grandmother        ovarian    Social History Social History   Tobacco Use   Smoking status: Never   Smokeless tobacco: Never  Vaping Use   Vaping status: Never Used  Substance Use Topics   Alcohol use: Yes    Comment: 1 beer daily    Drug use: No     Allergies   Patient has no known allergies.   Review of Systems Review of Systems Per HPI  Physical Exam Triage Vital Signs ED Triage Vitals [02/21/24 1715]  Encounter Vitals Group     BP (!) 162/81     Systolic BP Percentile      Diastolic BP Percentile      Pulse Rate 90     Resp 18     Temp 98.1 F (36.7 C)     Temp Source Oral     SpO2 95 %     Weight      Height      Head Circumference      Peak Flow      Pain Score 9     Pain Loc      Pain Education      Exclude from Growth Chart    No data found.  Updated Vital Signs BP (!) 162/81 (BP Location: Right Arm)   Pulse 90   Temp 98.1 F (36.7 C) (Oral)   Resp 18   LMP 02/09/2014   SpO2 95%   Visual Acuity Right Eye Distance:   Left Eye Distance:   Bilateral Distance:    Right Eye Near:   Left Eye Near:     Bilateral  Near:     Physical Exam Vitals and nursing note reviewed.  Constitutional:      Appearance: Normal appearance. She is not ill-appearing.  HENT:     Head: Atraumatic.  Eyes:     Extraocular Movements: Extraocular movements intact.     Conjunctiva/sclera: Conjunctivae normal.  Cardiovascular:     Rate and Rhythm: Normal rate and regular rhythm.     Heart sounds: Normal heart sounds.  Pulmonary:     Effort: Pulmonary effort is normal.     Breath sounds: Normal breath sounds.  Musculoskeletal:        General: Swelling and tenderness present. No deformity. Normal range of motion.     Cervical back: Normal range of motion and neck supple.     Comments: Trace edema bilateral lower legs diffusely, symmetrically.  Diffuse tenderness to palpation without point tenderness, negative squeeze test, negative Homans' sign  Skin:    General: Skin is warm and dry.     Findings: No bruising or erythema.  Neurological:     Mental Status: She is alert and oriented to person, place, and time.     Motor: No weakness.     Gait: Gait normal.  Psychiatric:        Mood and Affect: Mood normal.        Thought Content: Thought content normal.        Judgment: Judgment normal.      UC Treatments / Results  Labs (all labs ordered are listed, but only abnormal results are displayed) Labs Reviewed - No data to display  EKG   Radiology No results found.  Procedures Procedures (including critical care time)  Medications Ordered in UC Medications - No data to display  Initial Impression / Assessment and Plan / UC Course  I have reviewed the triage vital signs and the nursing notes.  Pertinent labs & imaging results that were available during my care of the patient were reviewed by me and considered in my medical decision making (see chart for details).     Exam reassuring with no red flag findings and recent workup ruling out DVT, heart failure.  Will treat with short course of  Lasix, wear daily compression stockings, leg elevation.  Return for any worsening symptoms.  Work note given.  Final Clinical Impressions(s) / UC Diagnoses   Final diagnoses:  Edema of both lower legs     Discharge Instructions      Wear compression stockings daily, elevate your legs at rest, drink plenty of fluids and eat a low-sodium diet.  I have sent over 1 week of fluid pills to help additionally.  Follow-up with your primary care provider soon as possible    ED Prescriptions     Medication Sig Dispense Auth. Provider   furosemide (LASIX) 20 MG tablet Take 1 tablet (20 mg total) by mouth daily. 7 tablet Particia Nearing, [place]      PDMP not reviewed this encounter.   Particia Nearing, [place] 02/21/24 [phone]

## 2024-03-03 ENCOUNTER — Ambulatory Visit
Admission: EM | Admit: 2024-03-03 | Discharge: 2024-03-03 | Disposition: A | Discharge status: Home / Self Care | Attending: Nurse Practitioner | Admitting: Nurse Practitioner

## 2024-03-03 ENCOUNTER — Encounter: Payer: Self-pay | Admitting: *Deleted

## 2024-03-03 DIAGNOSIS — B349 Viral infection, unspecified: Secondary | ICD-10-CM | POA: Diagnosis not present

## 2024-03-03 LAB — POC COVID19/FLU A&B COMBO
Covid Antigen, POC: NEGATIVE
Influenza A Antigen, POC: NEGATIVE
Influenza B Antigen, POC: NEGATIVE

## 2024-03-03 NOTE — Discharge Instructions (Addendum)
 The COVID/flu test was negative. You may take over-the-counter Tylenol as needed for pain, fever, or general discomfort. Make sure you are drinking plenty of fluids and getting plenty of rest. If symptoms fail to improve over the next several days, or appear to be worsening, you may follow-up in this clinic or with your primary care physician for further evaluation. Follow-up as needed.

## 2024-03-03 NOTE — ED Provider Notes (Signed)
 [facility]    CSN: [id number] Arrival date & time: 03/03/24  1201      History   Chief Complaint Chief Complaint  Patient presents with   Covid Exposure   Generalized Body Aches   Headache    HPI Vicki [name] is a 58 y.o. female.   The history is provided by the patient.   Patient presents with a 1 day history of headaches and bodyaches.  Patient denies fever, chills, sore throat, ear pain, nasal congestion, runny nose, cough, chest pain, abdominal pain, nausea, vomiting, or diarrhea.  Reports her husband tested positive for COVID 3 days ago.  States she has not taken any medication for her symptoms.  Past Medical History:  Diagnosis Date   Abnormal Pap smear    Anemia    Anxiety 2020   BV (bacterial vaginosis) 05/16/2013   Depression 2020   Dysrhythmia    Fibroids    uterine   Hypertension    Mental disorder    depression and panic attacks   Tachycardia 2018   pt states for the past 4 years she has went "in and out" of tachycardia    Patient Active Problem List   Diagnosis Date Noted   Low back pain 02/01/2023   Mild major depression, single episode (HCC) 02/23/2022   Avascular necrosis of hip, left (HCC) 10/06/2021   Status post left hip replacement 10/06/2021   Noncompliance with medication regimen 05/03/2021   S/P abdominal hysterectomy 02/25/2014   Depression 07/25/2013   Panic attacks 07/25/2013   Fibroids 05/16/2013   BV (bacterial vaginosis) 05/16/2013   Anemia 04/03/2012   Menorrhagia 04/03/2012   Hypokalemia 04/03/2012   Lower urinary tract infectious disease 04/03/2012   Emesis 04/03/2012    Past Surgical History:  Procedure Laterality Date   ABDOMINAL HYSTERECTOMY     BILATERAL SALPINGECTOMY Bilateral 02/25/2014   Procedure: BILATERAL SALPINGECTOMY;  Surgeon: Lazaro Arms, MD;  Location: [facility];  Service: Gynecology;  Laterality: Bilateral;   DILATION AND CURETTAGE OF UTERUS     SUPRACERVICAL ABDOMINAL HYSTERECTOMY N/A  02/25/2014   Procedure: HYSTERECTOMY SUPRACERVICAL ABDOMINAL;  Surgeon: Lazaro Arms, MD;  Location: [facility];  Service: Gynecology;  Laterality: N/A;   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   Procedure: LEFT TOTAL HIP ARTHROPLASTY ANTERIOR APPROACH;  Surgeon: Kathryne Hitch, MD;  Location: [facility];  Service: Orthopedics;  Laterality: Left;   TUBAL LIGATION      OB History     Gravida  7   Para  5   Term      Preterm      AB  2   Living  5      SAB  2   IAB      Ectopic      Multiple      Live Births               Home Medications    Prior to Admission medications   Medication Sig Start Date End Date Taking? Authorizing Provider  amLODipine (NORVASC) 5 MG tablet Take 1 tablet (5 mg total) by mouth daily. 03/10/21  Yes Eber Hong, MD  aspirin 81 MG chewable tablet Chew 1 tablet (81 mg total) by mouth 2 (two) times daily. 10/06/21  Yes Kathryne Hitch, MD  carvedilol (COREG) 3.125 MG tablet Take 1 tablet (3.125 mg total) by mouth 2 (two) times daily with a meal. 08/25/20  Yes Idol, Raynelle Fanning, PA-C  cholecalciferol (VITAMIN D3) 25  MCG (1000 UNIT) tablet Take 1,000 Units by mouth daily.   Yes [provider]  citalopram (CELEXA) 40 MG tablet Take 40 mg by mouth daily. 09/18/21  Yes [provider]  furosemide (LASIX) 20 MG tablet Take 1 tablet (20 mg total) by mouth daily. 02/21/24 03/22/24 Yes Particia Nearing, PA-C  gabapentin (NEURONTIN) 300 MG capsule Take 1 capsule (300 mg total) by mouth at bedtime. 01/31/24  Yes Vickki Hearing, MD  predniSONE (DELTASONE) 20 MG tablet Take 2 tablets (40 mg total) by mouth daily. 01/01/24  Yes Eber Hong, MD  simvastatin (ZOCOR) 20 MG tablet Take 20 mg by mouth daily. 03/16/22  Yes [provider]  tiZANidine (ZANAFLEX) 4 MG tablet Take 4 mg by mouth every 6 (six) hours as needed for muscle spasms.   Yes [provider]  albuterol (VENTOLIN HFA) 108 (90 Base) MCG/ACT inhaler  Inhale 2 puffs into the lungs every 4 (four) hours as needed for wheezing or shortness of breath. 07/10/23   Gilda Crease, MD  hydrOXYzine (ATARAX) 25 MG tablet Take 25 mg by mouth 3 (three) times daily as needed for anxiety. 04/27/22   [provider]  ibuprofen (ADVIL) 800 MG tablet Take 1 tablet (800 mg total) by mouth every 8 (eight) hours as needed. 08/23/23   Vickki Hearing, MD  lidocaine (LIDODERM) 5 % Place 1 patch onto the skin daily. Remove & Discard patch within 12 hours or as directed by MD 10/03/23   Sabas Sous, MD    Family History Family History  Problem Relation Age of Onset   Congestive Heart Failure Mother    Congestive Heart Failure Maternal Grandmother    Cancer Paternal Grandmother        ovarian    Social History Social History   Tobacco Use   Smoking status: Never   Smokeless tobacco: Never  Vaping Use   Vaping status: Never Used  Substance Use Topics   Alcohol use: Yes    Comment: 1 beer daily    Drug use: No     Allergies   Patient has no known allergies.   Review of Systems Review of Systems Per HPI  Physical Exam Triage Vital Signs ED Triage Vitals  Encounter Vitals Group     BP 03/03/24 1245 133/77     Systolic BP Percentile --      Diastolic BP Percentile --      Pulse Rate 03/03/24 1245 73     Resp 03/03/24 1245 18     Temp 03/03/24 1245 98.1 F (36.7 C)     Temp Source 03/03/24 1245 Oral     SpO2 03/03/24 1245 97 %     Weight --      Height --      Head Circumference --      Peak Flow --      Pain Score 03/03/24 1243 0     Pain Loc --      Pain Education --      Exclude from Growth Chart --    No data found.  Updated Vital Signs BP 133/77 (BP Location: Right Arm)   Pulse 73   Temp 98.1 F (36.7 C) (Oral)   Resp 18   LMP 02/09/2014   SpO2 97%   Visual Acuity Right Eye Distance:   Left Eye Distance:   Bilateral Distance:    Right Eye Near:   Left Eye Near:    Bilateral Near:  Physical Exam Vitals and nursing note reviewed.  Constitutional:      General: She is not in acute distress.    Appearance: Normal appearance.  HENT:     Head: Normocephalic.     Right Ear: Tympanic membrane, ear canal and external ear normal.     Left Ear: Tympanic membrane, ear canal and external ear normal.     Nose: Nose normal.     Right Turbinates: Enlarged and swollen.     Left Turbinates: Enlarged and swollen.     Right Sinus: No maxillary sinus tenderness or frontal sinus tenderness.     Left Sinus: No maxillary sinus tenderness or frontal sinus tenderness.     Mouth/Throat:     Lips: Pink.     Mouth: Mucous membranes are moist.     Pharynx: Uvula midline. No pharyngeal swelling, oropharyngeal exudate, posterior oropharyngeal erythema, uvula swelling or postnasal drip.  Eyes:     Extraocular Movements: Extraocular movements intact.     Conjunctiva/sclera: Conjunctivae normal.     Pupils: Pupils are equal, round, and reactive to light.  Cardiovascular:     Rate and Rhythm: Regular rhythm.     Pulses: Normal pulses.     Heart sounds: Normal heart sounds.  Pulmonary:     Effort: Pulmonary effort is normal. No respiratory distress.     Breath sounds: Normal breath sounds. No stridor. No wheezing, rhonchi or rales.  Abdominal:     General: Bowel sounds are normal.     Palpations: Abdomen is soft.     Tenderness: There is no abdominal tenderness.  Musculoskeletal:     Cervical back: Normal range of motion.  Lymphadenopathy:     Cervical: No cervical adenopathy.  Skin:    General: Skin is warm and dry.  Neurological:     General: No focal deficit present.     Mental Status: She is alert and oriented to person, place, and time.  Psychiatric:        Mood and Affect: Mood normal.        Behavior: Behavior normal.      UC Treatments / Results  Labs (all labs ordered are listed, but only abnormal results are displayed) Labs Reviewed  POC COVID19/FLU A&B COMBO -  Normal    EKG   Radiology No results found.  Procedures Procedures (including critical care time)  Medications Ordered in UC Medications - No data to display  Initial Impression / Assessment and Plan / UC Course  I have reviewed the triage vital signs and the nursing notes.  Pertinent labs & imaging results that were available during my care of the patient were reviewed by me and considered in my medical decision making (see chart for details).  COVID/flu test was negative.  Suspect patient's headache and bodyaches are most likely of viral etiology.  Supportive care recommendations were provided and discussed with the patient to include fluids, rest, and over-the-counter Tylenol.  Discussed indications with patient regarding follow-up.  Patient was in agreement with this plan of care and verbalized understanding.  All questions were answered.  Patient stable for discharge.  Work note was provided. Final Clinical Impressions(s) / UC Diagnoses   Final diagnoses:  Viral illness     Discharge Instructions      The COVID/flu test was negative. You may take over-the-counter Tylenol as needed for pain, fever, or general discomfort. Make sure you are drinking plenty of fluids and getting plenty of rest. If symptoms fail to improve over  the next several days, or appear to be worsening, you may follow-up in this clinic or with your primary care physician for further evaluation. Follow-up as needed.     ED Prescriptions   None    PDMP not reviewed this encounter.   Abran Cantor, NP 03/03/24 1317

## 2024-03-03 NOTE — ED Triage Notes (Signed)
 Pt states she has headache, body aches since yesterday. Pt states her husband tested positive over the weekend for COVID. She hasn't been taking any meds for her sx.

## 2024-03-15 ENCOUNTER — Other Ambulatory Visit: Payer: Self-pay | Admitting: Orthopedic Surgery

## 2024-03-15 DIAGNOSIS — M545 Low back pain, unspecified: Secondary | ICD-10-CM

## 2024-03-31 ENCOUNTER — Other Ambulatory Visit: Payer: Self-pay

## 2024-03-31 ENCOUNTER — Encounter (HOSPITAL_COMMUNITY): Payer: Self-pay

## 2024-03-31 ENCOUNTER — Emergency Department (HOSPITAL_COMMUNITY)
Admission: EM | Admit: 2024-03-31 | Discharge: 2024-03-31 | Disposition: A | Discharge status: Home / Self Care | Attending: Emergency Medicine | Admitting: Emergency Medicine

## 2024-03-31 DIAGNOSIS — Z7982 Long term (current) use of aspirin: Secondary | ICD-10-CM | POA: Insufficient documentation

## 2024-03-31 DIAGNOSIS — M25551 Pain in right hip: Secondary | ICD-10-CM | POA: Diagnosis present

## 2024-03-31 DIAGNOSIS — M5441 Lumbago with sciatica, right side: Secondary | ICD-10-CM | POA: Diagnosis not present

## 2024-03-31 DIAGNOSIS — M5431 Sciatica, right side: Secondary | ICD-10-CM

## 2024-03-31 MED ORDER — CELECOXIB 200 MG PO CAPS: 200.0000 mg | ORAL_CAPSULE | Freq: Two times a day (BID) | ORAL | 0 refills | Status: AC

## 2024-03-31 MED ORDER — METHYLPREDNISOLONE 4 MG PO TBPK: ORAL_TABLET | ORAL | 0 refills | Status: DC

## 2024-03-31 MED ORDER — KETOROLAC TROMETHAMINE 60 MG/2ML IM SOLN
60.0000 mg | Freq: Once | INTRAMUSCULAR | Status: AC
  Administered 2024-03-31: 60 mg via INTRAMUSCULAR
  Filled 2024-03-31: qty 2

## 2024-03-31 MED ORDER — DEXAMETHASONE SODIUM PHOSPHATE 10 MG/ML IJ SOLN
10.0000 mg | Freq: Once | INTRAMUSCULAR | Status: AC
  Administered 2024-03-31: 10 mg via INTRAMUSCULAR
  Filled 2024-03-31: qty 1

## 2024-03-31 NOTE — ED Provider Notes (Signed)
 [facility] Provider Note   CSN: [id number] Arrival date & time: 03/31/24  1604     History  Chief Complaint  Patient presents with   Hip Pain    Vicki [name] is a 58 y.o. female hx of acute on chronic R sided sciatic pain. Worse today. SHe is on gabapentin and tizanidine.  Denies saddle anesthesia.bowel/bladder incontinence. weakness  Hip Pain       Home Medications Prior to Admission medications   Medication Sig Start Date End Date Taking? Authorizing Provider  albuterol (VENTOLIN HFA) 108 (90 Base) MCG/ACT inhaler Inhale 2 puffs into the lungs every 4 (four) hours as needed for wheezing or shortness of breath. 07/10/23   Gilda Crease, MD  amLODipine (NORVASC) 5 MG tablet Take 1 tablet (5 mg total) by mouth daily. 03/10/21   Eber Hong, MD  aspirin 81 MG chewable tablet Chew 1 tablet (81 mg total) by mouth 2 (two) times daily. 10/06/21   Kathryne Hitch, MD  carvedilol (COREG) 3.125 MG tablet Take 1 tablet (3.125 mg total) by mouth 2 (two) times daily with a meal. 08/25/20   Idol, Raynelle Fanning, PA-C  cholecalciferol (VITAMIN D3) 25 MCG (1000 UNIT) tablet Take 1,000 Units by mouth daily.    [provider]  citalopram (CELEXA) 40 MG tablet Take 40 mg by mouth daily. 09/18/21   [provider]  furosemide (LASIX) 20 MG tablet Take 1 tablet (20 mg total) by mouth daily. 02/21/24 03/22/24  Particia Nearing, PA-C  gabapentin (NEURONTIN) 300 MG capsule Take 1 capsule (300 mg total) by mouth at bedtime. 01/31/24   Vickki Hearing, MD  hydrOXYzine (ATARAX) 25 MG tablet Take 25 mg by mouth 3 (three) times daily as needed for anxiety. 04/27/22   [provider]  ibuprofen (ADVIL) 800 MG tablet Take 1 tablet (800 mg total) by mouth every 8 (eight) hours as needed. 08/23/23   Vickki Hearing, MD  lidocaine (LIDODERM) 5 % Place 1 patch onto the skin daily. Remove & Discard patch within 12 hours or as  directed by MD 10/03/23   Sabas Sous, MD  predniSONE (DELTASONE) 20 MG tablet Take 2 tablets (40 mg total) by mouth daily. 01/01/24   Eber Hong, MD  simvastatin (ZOCOR) 20 MG tablet Take 20 mg by mouth daily. 03/16/22   [provider]  tiZANidine (ZANAFLEX) 4 MG tablet TAKE 1 TABLET BY MOUTH EVERY 6 HOURS AS NEEDED FOR MUSCLE SPASM 03/17/24   Vickki Hearing, MD      Allergies    Patient has no known allergies.    Review of Systems   Review of Systems  Physical Exam Updated Vital Signs BP (!) 179/80 (BP Location: Right Arm)   Pulse 65   Temp 98.4 F (36.9 C) (Oral)   Resp 16   Ht 5\' 7"  (1.702 m)   Wt 77.6 kg   LMP 02/09/2014   SpO2 97%   BMI 26.79 kg/m  Physical Exam Vitals and nursing note reviewed.  Constitutional:      General: She is not in acute distress.    Appearance: She is well-developed. She is not diaphoretic.  HENT:     Head: Normocephalic and atraumatic.     Right Ear: External ear normal.     Left Ear: External ear normal.     Nose: Nose normal.     Mouth/Throat:     Mouth: Mucous membranes are moist.  Eyes:  General: No scleral icterus.    Conjunctiva/sclera: Conjunctivae normal.  Cardiovascular:     Rate and Rhythm: Normal rate and regular rhythm.     Heart sounds: Normal heart sounds. No murmur heard.    No friction rub. No gallop.  Pulmonary:     Effort: Pulmonary effort is normal. No respiratory distress.     Breath sounds: Normal breath sounds.  Abdominal:     General: Bowel sounds are normal. There is no distension.     Palpations: Abdomen is soft. There is no mass.     Tenderness: There is no abdominal tenderness. There is no guarding.  Musculoskeletal:     Cervical back: Normal range of motion.     Comments: Lumbosacral spine area reveals no tenderness and no spasm.  Painful and reduced LS ROM noted. Straight leg raise is positive on right. DTR's, motor strength and sensation normal, including heel and toe gait.   Peripheral pulses are palpable. Hips and knees have full range of motion without pain. No abdominal tenderness, mass or organomegaly.   Skin:    General: Skin is warm and dry.  Neurological:     Mental Status: She is alert and oriented to person, place, and time.  Psychiatric:        Behavior: Behavior normal.     ED Results / Procedures / Treatments   Labs (all labs ordered are listed, but only abnormal results are displayed) Labs Reviewed - No data to display  EKG None  Radiology No results found.  Procedures Procedures    Medications Ordered in ED Medications  ketorolac (TORADOL) injection 60 mg (has no administration in time range)  dexamethasone (DECADRON) injection 10 mg (has no administration in time range)    ED Course/ Medical Decision Making/ A&P                                 Medical Decision Making Risk Prescription drug management.   Patient with back pain.  No neurological deficits and normal neuro exam.  Patient can walk but states is painful.  No loss of bowel or bladder control.  No concern for cauda equina.  No fever, night sweats, weight loss, h/o cancer, IVDU.  RICE protocol and pain medicine indicated and discussed with patient.          Final Clinical Impression(s) / ED Diagnoses Final diagnoses:  None    Rx / DC Orders ED Discharge Orders     None         Loralai, [place], PA-C 03/31/24 2310    Rondel Baton, MD 04/03/24 [phone]

## 2024-03-31 NOTE — Discharge Instructions (Addendum)
Contact a health care provider if: Your pain is not controlled by medicine. Your pain does not improve or gets worse. Your pain lasts longer than 4 weeks. You have unexplained weight loss. Get help right away if: You are not able to control when you urinate or have bowel movements (incontinence). You have: Weakness in your lower back, pelvis, buttocks, or legs that gets worse. Redness or swelling of your back. A burning sensation when you urinate.

## 2024-03-31 NOTE — ED Triage Notes (Signed)
 Pt arrived via POV c/o right hip pain that radiates down to her foot. Pt denies injury. Pt reports pain began when she woke up this morning.

## 2024-05-14 ENCOUNTER — Other Ambulatory Visit: Payer: Self-pay

## 2024-05-14 ENCOUNTER — Emergency Department (HOSPITAL_COMMUNITY)
Admission: EM | Admit: 2024-05-14 | Discharge: 2024-05-14 | Disposition: A | Discharge status: Home / Self Care | Attending: Emergency Medicine | Admitting: Emergency Medicine

## 2024-05-14 ENCOUNTER — Emergency Department (HOSPITAL_COMMUNITY)

## 2024-05-14 ENCOUNTER — Encounter (HOSPITAL_COMMUNITY): Payer: Self-pay

## 2024-05-14 DIAGNOSIS — I10 Essential (primary) hypertension: Secondary | ICD-10-CM | POA: Diagnosis not present

## 2024-05-14 DIAGNOSIS — Z79899 Other long term (current) drug therapy: Secondary | ICD-10-CM | POA: Diagnosis not present

## 2024-05-14 DIAGNOSIS — S8391XA Sprain of unspecified site of right knee, initial encounter: Secondary | ICD-10-CM

## 2024-05-14 DIAGNOSIS — Y92410 Unspecified street and highway as the place of occurrence of the external cause: Secondary | ICD-10-CM | POA: Insufficient documentation

## 2024-05-14 DIAGNOSIS — Z96642 Presence of left artificial hip joint: Secondary | ICD-10-CM | POA: Diagnosis not present

## 2024-05-14 DIAGNOSIS — Z7982 Long term (current) use of aspirin: Secondary | ICD-10-CM | POA: Insufficient documentation

## 2024-05-14 DIAGNOSIS — S20219A Contusion of unspecified front wall of thorax, initial encounter: Secondary | ICD-10-CM | POA: Diagnosis not present

## 2024-05-14 DIAGNOSIS — S8991XA Unspecified injury of right lower leg, initial encounter: Secondary | ICD-10-CM | POA: Diagnosis present

## 2024-05-14 MED ORDER — ACETAMINOPHEN 325 MG PO TABS: 650.0000 mg | ORAL_TABLET | Freq: Four times a day (QID) | ORAL | 0 refills | Status: AC | PRN

## 2024-05-14 MED ORDER — IBUPROFEN 800 MG PO TABS
800.0000 mg | ORAL_TABLET | Freq: Once | ORAL | Status: AC
  Administered 2024-05-14: 800 mg via ORAL
  Filled 2024-05-14: qty 1

## 2024-05-14 MED ORDER — TIZANIDINE HCL 4 MG PO TABS: 4.0000 mg | ORAL_TABLET | Freq: Four times a day (QID) | ORAL | 0 refills | Status: DC | PRN

## 2024-05-14 MED ORDER — OXYCODONE HCL 5 MG PO TABS
5.0000 mg | ORAL_TABLET | Freq: Once | ORAL | Status: AC
  Administered 2024-05-14: 5 mg via ORAL
  Filled 2024-05-14: qty 1

## 2024-05-14 MED ORDER — LIDOCAINE 5 % EX PTCH: 1.0000 | MEDICATED_PATCH | Freq: Every day | CUTANEOUS | 0 refills | Status: DC | PRN

## 2024-05-14 MED ORDER — OXYCODONE HCL 5 MG PO TABS: 5.0000 mg | ORAL_TABLET | ORAL | 0 refills | Status: DC | PRN

## 2024-05-14 MED ORDER — LIDOCAINE 5 % EX PTCH
1.0000 | MEDICATED_PATCH | Freq: Once | CUTANEOUS | Status: DC
  Administered 2024-05-14: 1 via TRANSDERMAL
  Filled 2024-05-14: qty 1

## 2024-05-14 MED ORDER — ACETAMINOPHEN 500 MG PO TABS
1000.0000 mg | ORAL_TABLET | Freq: Once | ORAL | Status: AC
Start: 2024-05-14 — End: 2024-05-14
  Administered 2024-05-14: 1000 mg via ORAL
  Filled 2024-05-14: qty 2
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
## 2024-05-14 NOTE — ED Notes (Signed)
 Pt ambulated to the restroom, slight limp observed in her gait.
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
## 2024-05-14 NOTE — Discharge Instructions (Addendum)
 It was a pleasure caring for you today in the emergency department.  Please follow up with orthopedics if your knee pain persists >7-10 days  Please return to the emergency department for any worsening or worrisome symptoms.

## 2024-05-20 ENCOUNTER — Encounter: Payer: Self-pay | Admitting: Emergency Medicine

## 2024-05-20 ENCOUNTER — Other Ambulatory Visit: Payer: Self-pay

## 2024-05-20 ENCOUNTER — Ambulatory Visit: Admission: EM | Admit: 2024-05-20 | Discharge: 2024-05-20 | Disposition: A | Discharge status: Home / Self Care

## 2024-05-20 DIAGNOSIS — R030 Elevated blood-pressure reading, without diagnosis of hypertension: Secondary | ICD-10-CM

## 2024-05-20 DIAGNOSIS — R0789 Other chest pain: Secondary | ICD-10-CM
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
## 2024-06-02 ENCOUNTER — Emergency Department (HOSPITAL_COMMUNITY)

## 2024-06-02 ENCOUNTER — Other Ambulatory Visit: Payer: Self-pay

## 2024-06-02 ENCOUNTER — Encounter (HOSPITAL_COMMUNITY): Payer: Self-pay

## 2024-06-02 ENCOUNTER — Emergency Department (HOSPITAL_COMMUNITY)
Admission: EM | Admit: 2024-06-02 | Discharge: 2024-06-02 | Disposition: A | Discharge status: Home / Self Care | Attending: Emergency Medicine | Admitting: Emergency Medicine

## 2024-06-02 DIAGNOSIS — R0789 Other chest pain: Secondary | ICD-10-CM | POA: Diagnosis present

## 2024-06-02 DIAGNOSIS — Z7982 Long term (current) use of aspirin: Secondary | ICD-10-CM | POA: Diagnosis not present

## 2024-06-02 LAB — CBC
HCT: 42.4 % (ref 36.0–46.0)
Hemoglobin: 14.5 g/dL (ref 12.0–15.0)
MCH: 28.2 pg (ref 26.0–34.0)
MCHC: 34.2 g/dL (ref 30.0–36.0)
MCV: 82.3 fL (ref 80.0–100.0)
Platelets: 278 10*3/uL (ref 150–400)
RBC: 5.15 MIL/uL — ABNORMAL HIGH (ref 3.87–5.11)
RDW: 14.2 % (ref 11.5–15.5)
WBC: 7.7 10*3/uL (ref 4.0–10.5)
nRBC: 0 % (ref 0.0–0.2)

## 2024-06-02 LAB — BASIC METABOLIC PANEL WITH GFR
Anion gap: 10 (ref 5–15)
BUN: 13 mg/dL (ref 6–20)
CO2: 20 mmol/L — ABNORMAL LOW (ref 22–32)
Calcium: 9.2 mg/dL (ref 8.9–10.3)
Chloride: 105 mmol/L (ref 98–111)
Creatinine, Ser: 0.91 mg/dL (ref 0.44–1.00)
GFR, Estimated: >60 mL/min
Glucose, Bld: 104 mg/dL — ABNORMAL HIGH (ref 70–99)
Potassium: 3.6 mmol/L (ref 3.5–5.1)
Sodium: 135 mmol/L (ref 135–145)

## 2024-06-02 LAB — TROPONIN I (HIGH SENSITIVITY): Troponin I (High Sensitivity): <2 ng/L

## 2024-06-02 MED ORDER — TRAMADOL HCL 50 MG PO TABS: 50.0000 mg | ORAL_TABLET | Freq: Four times a day (QID) | ORAL | 0 refills | Status: DC | PRN

## 2024-06-02 MED ORDER — NAPROXEN 500 MG PO TABS: 500.0000 mg | ORAL_TABLET | Freq: Two times a day (BID) | ORAL | 0 refills | Status: DC

## 2024-06-02 MED ORDER — KETOROLAC TROMETHAMINE 30 MG/ML IJ SOLN
30.0000 mg | Freq: Once | INTRAMUSCULAR | Status: AC
  Administered 2024-06-02: 30 mg via INTRAVENOUS
  Filled 2024-06-02: qty 1
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
## 2024-06-02 NOTE — Discharge Instructions (Addendum)
Begin taking naproxen as prescribed.  Begin taking tramadol as prescribed as needed for pain not relieved with naproxen.  Rest.  Follow-up with primary doctor if not improving in the next week.

## 2024-06-06 ENCOUNTER — Other Ambulatory Visit: Payer: Self-pay | Admitting: Orthopedic Surgery

## 2024-06-06 DIAGNOSIS — M545 Low back pain, unspecified: Secondary | ICD-10-CM

## 2024-06-24 ENCOUNTER — Other Ambulatory Visit: Payer: Self-pay

## 2024-06-24 ENCOUNTER — Emergency Department (HOSPITAL_COMMUNITY)

## 2024-06-24 ENCOUNTER — Encounter (HOSPITAL_COMMUNITY): Payer: Self-pay

## 2024-06-24 ENCOUNTER — Emergency Department (HOSPITAL_COMMUNITY)
Admission: EM | Admit: 2024-06-24 | Discharge: 2024-06-24 | Disposition: A | Discharge status: Home / Self Care | Attending: Emergency Medicine | Admitting: Emergency Medicine

## 2024-06-24 DIAGNOSIS — Z79899 Other long term (current) drug therapy: Secondary | ICD-10-CM | POA: Insufficient documentation

## 2024-06-24 DIAGNOSIS — M25551 Pain in right hip: Secondary | ICD-10-CM | POA: Diagnosis present

## 2024-06-24 DIAGNOSIS — M5431 Sciatica, right side: Secondary | ICD-10-CM | POA: Insufficient documentation

## 2024-06-24 DIAGNOSIS — Z96642 Presence of left artificial hip joint: Secondary | ICD-10-CM | POA: Diagnosis not present

## 2024-06-24 DIAGNOSIS — Z7982 Long term (current) use of aspirin: Secondary | ICD-10-CM | POA: Diagnosis not present

## 2024-06-24 DIAGNOSIS — I10 Essential (primary) hypertension: Secondary | ICD-10-CM | POA: Insufficient documentation

## 2024-06-24 MED ORDER — HYDROCODONE-ACETAMINOPHEN 5-325 MG PO TABS: 1.0000 | ORAL_TABLET | Freq: Four times a day (QID) | ORAL | 0 refills | Status: DC | PRN

## 2024-06-24 MED ORDER — PREDNISONE 50 MG PO TABS
60.0000 mg | ORAL_TABLET | Freq: Once | ORAL | Status: AC
  Administered 2024-06-24: 60 mg via ORAL
  Filled 2024-06-24: qty 1

## 2024-06-24 MED ORDER — PREDNISONE 10 MG PO TABS: ORAL_TABLET | ORAL | 0 refills | Status: DC

## 2024-06-24 MED ORDER — OXYCODONE-ACETAMINOPHEN 5-325 MG PO TABS
1.0000 | ORAL_TABLET | ORAL | Status: AC | PRN
  Administered 2024-06-24 (×2): 1 via ORAL
  Filled 2024-06-24 (×2): qty 1

## 2024-06-24 NOTE — ED Provider Notes (Signed)
 [place] EMERGENCY DEPARTMENT AT [facility] Provider Note   CSN: [id number] Arrival date & time: 06/24/24  9075     Patient presents with: Hip Pain   Vicki [name] is a 58 y.o. female with a history including hypertension, depression, history of anemia and dysrhythmia, surgical history significant for left total hip arthroplasty presenting for evaluation of right hip pain which radiates into her foot.  She describes walking at work yesterday on hard pavement when she had sudden onset of pain in her right lateral hip region radiating down to her foot.  She is under the care of Dr. 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  She describes a burning pain from her right lateral hip area to her medial thigh shooting all the way down to her large toe.  Pain is worsened with movement, better at rest.  She denies low back pain or injury.  She has taken Tylenol  yesterday with no improvement in symptoms.  She denies weakness in her extremities but she near collapsed trying to walk secondary to pain.  No fevers or chills, denies urinary or fecal incontinence or retention.   The history is provided by the patient.       Prior to Admission medications   Medication Sig Start Date End Date Taking? Authorizing Provider  HYDROcodone -acetaminophen  (NORCO/VICODIN) 5-325 MG tablet Take 1 tablet by mouth every 6 (six) hours as needed for severe pain (pain score 7-10). 06/24/24  Yes Leniyah Martell, PA-C  predniSONE  (DELTASONE ) 10 MG tablet 6, 5, 4, 3, 2 then 1 tablet by mouth daily for 6 days total. 06/24/24  Yes Jaslynne Dahan, PA-C  acetaminophen  (TYLENOL ) 325 MG tablet Take 2 tablets (650 mg total) by mouth every 6 (six) hours as needed. 05/14/24   Elnor Jayson LABOR, DO  albuterol  (VENTOLIN  HFA) 108 (90 Base) MCG/ACT inhaler Inhale 2 puffs into the lungs every 4 (four) hours as needed for wheezing or shortness of breath. 07/10/23   Haze Lonni PARAS, MD  amLODipine  (NORVASC ) 5 MG tablet Take 1 tablet (5 mg total) by mouth daily. 03/10/21   Cleotilde Rogue, MD  aspirin  81 MG chewable tablet Chew 1 tablet (81 mg total) by mouth 2 (two) times daily. 10/06/21   Vernetta Lonni GRADE, MD  carvedilol  (COREG ) 3.125 MG tablet Take 1 tablet (3.125 mg total) by mouth 2 (two) times daily with a meal. 08/25/20   Josemanuel Eakins, Mliss, PA-C  celecoxib  (CELEBREX ) 200 MG capsule Take 1 capsule (200 mg total) by mouth 2 (two) times daily. 03/31/24   Kuhnert, Abigail, PA-C  cholecalciferol  (VITAMIN D3) 25 MCG (1000 UNIT) tablet Take 1,000 Units by mouth daily.    [provider]  citalopram  (CELEXA ) 40 MG tablet Take 40 mg by mouth daily. 09/18/21   [provider]  furosemide  (LASIX ) 20 MG tablet Take 1 tablet (20 mg total) by mouth daily. 02/21/24 03/22/24  Stuart Vernell Norris, PA-C  gabapentin  (NEURONTIN ) 300 MG capsule Take 1 capsule (300 mg total) by mouth at bedtime. 01/31/24   Margrette Taft BRAVO, MD  hydrOXYzine  (ATARAX ) 25 MG tablet Take 25 mg by mouth 3 (three) times daily as needed for anxiety. 04/27/22   [provider]  lidocaine  (LIDODERM ) 5 % Place 1 patch onto the skin daily as needed. Remove & Discard patch within 12 hours or as directed by MD 05/14/24   Elnor Jayson LABOR, DO  methylPREDNISolone  (MEDROL   DOSEPAK) 4 MG TBPK tablet Use as directed 03/31/24   Piper, Abigail, PA-C  naproxen  (NAPROSYN ) 500 MG tablet Take 1 tablet (500 mg total) by mouth 2 (two) times daily. 06/02/24   Geroldine Berg, MD  oxyCODONE  (ROXICODONE ) 5 MG immediate release tablet Take 1 tablet (5 mg total) by mouth every 4 (four) hours as needed for severe pain (pain score 7-10). 05/14/24   Elnor Jayson LABOR, DO  simvastatin (ZOCOR) 20 MG tablet Take 20 mg by mouth daily. 03/16/22   [provider]  tiZANidine  (ZANAFLEX ) 4 MG tablet Take 1 tablet (4 mg total) by mouth every 6 (six) hours as needed for muscle spasms. 05/14/24   Elnor Jayson A, DO  tiZANidine   (ZANAFLEX ) 4 MG tablet TAKE 1 TABLET BY MOUTH EVERY 6 HOURS AS NEEDED FOR MUSCLE SPASM 06/11/24   Margrette Taft BRAVO, MD  traMADol  (ULTRAM ) 50 MG tablet Take 1 tablet (50 mg total) by mouth every 6 (six) hours as needed. 06/02/24   Geroldine Berg, MD    Allergies: Patient has no known allergies.    Review of Systems  Constitutional:  Negative for chills and fever.  Respiratory:  Negative for shortness of breath.   Cardiovascular:  Negative for chest pain and leg swelling.  Gastrointestinal:  Negative for abdominal distention, abdominal pain, constipation, nausea and vomiting.  Genitourinary:  Negative for difficulty urinating, dysuria, flank pain, frequency and urgency.  Musculoskeletal:  Positive for arthralgias. Negative for gait problem and joint swelling.  Skin: Negative.   Neurological:  Positive for numbness. Negative for weakness.    Updated Vital Signs BP (!) 123/96   Pulse 68   Temp 98.4 F (36.9 C) (Oral)   Resp 16   Ht 5' 7 (1.702 m)   Wt 74.8 kg   LMP 02/09/2014   SpO2 97%   BMI 25.84 kg/m   Physical Exam Vitals and nursing note reviewed.  Constitutional:      Appearance: She is well-developed.  HENT:     Head: Normocephalic.   Eyes:     Conjunctiva/sclera: Conjunctivae normal.    Cardiovascular:     Rate and Rhythm: Normal rate.     Pulses: Normal pulses.     Comments: Pedal pulses normal. Pulmonary:     Effort: Pulmonary effort is normal.  Abdominal:     General: Bowel sounds are normal. There is no distension.     Palpations: Abdomen is soft. There is no mass.   Musculoskeletal:        General: Normal range of motion.     Cervical back: Normal range of motion and neck supple.     Lumbar back: Tenderness present. No swelling, edema or spasms.     Right hip: Tenderness present. No bony tenderness. Normal range of motion.     Comments: Patient has no midline lumbar tenderness or deformity.  She does endorse exacerbation of radiating pain with  palpation at the right SI.  She also has tenderness at her right hip greater trochanter.   Skin:    General: Skin is warm and dry.   Neurological:     General: No focal deficit present.     Mental Status: She is alert and oriented to person, place, and time.     Sensory: No sensory deficit.     Motor: No tremor or atrophy.     Gait: Gait normal.     Deep Tendon Reflexes:     Reflex Scores:      Patellar reflexes  are 2+ on the right side and 2+ on the left side.    Comments: No strength deficit noted in hip and knee flexor and extensor muscle groups, however poor effort right secondary to pain.  Ankle flexion and extension intact.    (all labs ordered are listed, but only abnormal results are displayed) Labs Reviewed - No data to display  EKG: None  Radiology: DG Lumbar Spine Complete Result Date: 06/24/2024 CLINICAL DATA:  Right hip pain.  No trauma. EXAM: LUMBAR SPINE - COMPLETE 4+ VIEW; DG HIP (WITH OR WITHOUT PELVIS) 2-3V RIGHT COMPARISON:  01/31/2024 hip radiographs. Lumbar spine radiographs of 08/23/2023. Both without reports. FINDINGS: Lumbar spine: Five lumbar type vertebral bodies. Sacroiliac joints are symmetric. Maintenance of vertebral body height and alignment. Intervertebral disc heights are maintained. Right hip: AP view of the pelvis and AP/frog leg views of the right hip. Left hip arthroplasty. No acute fracture or dislocation. Right hip joint space maintained. Degenerative irregularity of the symphysis pubis. IMPRESSION: No acute findings about the right hip or lumbar spine. No explanation for patient's symptoms. Electronically Signed   By: Rockey Kilts M.D.   On: 06/24/2024 11:26   DG HIP UNILAT W OR W/O PELVIS 2-3 VIEWS RIGHT Result Date: 06/24/2024 CLINICAL DATA:  Right hip pain.  No trauma. EXAM: LUMBAR SPINE - COMPLETE 4+ VIEW; DG HIP (WITH OR WITHOUT PELVIS) 2-3V RIGHT COMPARISON:  01/31/2024 hip radiographs. Lumbar spine radiographs of 08/23/2023. Both without  reports. FINDINGS: Lumbar spine: Five lumbar type vertebral bodies. Sacroiliac joints are symmetric. Maintenance of vertebral body height and alignment. Intervertebral disc heights are maintained. Right hip: AP view of the pelvis and AP/frog leg views of the right hip. Left hip arthroplasty. No acute fracture or dislocation. Right hip joint space maintained. Degenerative irregularity of the symphysis pubis. IMPRESSION: No acute findings about the right hip or lumbar spine. No explanation for patient's symptoms. Electronically Signed   By: Rockey Kilts M.D.   On: 06/24/2024 11:26     Procedures   Medications Ordered in the ED  oxyCODONE -acetaminophen  (PERCOCET/ROXICET) 5-325 MG per tablet 1 tablet (1 tablet Oral Given 06/24/24 1039)  predniSONE  (DELTASONE ) tablet 60 mg (has no administration in time range)                                    Medical Decision Making Patient presenting with right lateral hip pain which is reproducible with palpation on exam suggesting musculoskeletal source, given the location of this localized pain this could represent greater trochanter bursitis.  However she does have radicular pain starting from the SI joint suggesting a sciatic component.  Imaging per below reviewed and negative for acute findings.  She has no symptoms to suggest cauda equina, afebrile, doubt infectious process.  She is placed on a prednisone  taper, hydrocodone  for the next 2 days as needed increased pain.  Discussed heat therapy and close follow-up with the orthopedist if symptoms are not improving.  Amount and/or Complexity of Data Reviewed Radiology: ordered.    Details: X-rays reviewed including lumbar films with no acute findings, right hip also no acute findings.  Risk Prescription drug management.        Final diagnoses:  Right hip pain  Sciatica of right side    ED Discharge Orders          Ordered    predniSONE  (DELTASONE ) 10 MG tablet  06/24/24 1137     HYDROcodone -acetaminophen  (NORCO/VICODIN) 5-325 MG tablet  Every 6 hours PRN        06/24/24 1137               Purva Vessell, PA-C 06/24/24 1141    Bernard Drivers, MD 06/25/24 [phone]

## 2024-06-24 NOTE — ED Notes (Signed)
 Patient A&O x 4, non diaphoretic. Appears calm, no observed signs of pain. Non grimacing.

## 2024-06-24 NOTE — ED Triage Notes (Signed)
 Pt c/o rt hip pain, radiating from hip down to foot. Pt rates pain 9/10; pt states pain started yesterday at work, sudden onset; pt describes pain as like a tooth ache, stabbing.; pt describes tingling in toes.; pedal pulse palpable. denies N/V/D, A&Ox4;

## 2024-06-24 NOTE — ED Notes (Signed)
 Pt states she does have a hx of hypertension, did take her medications last night.

## 2024-06-24 NOTE — Discharge Instructions (Signed)
 I recommend applying a heating pad to your right hip and low back region for 20 minutes several times daily.  Take your next dose of prednisone  tomorrow as you have received today's dose here.  You may continue your muscle relaxer.  I also prescribed you a small quantity of hydrocodone  if needed for increased pain relief over the next 1 to 2 days as the prednisone  starts to heal what I suspect is an inflamed sciatic nerve.  Plan follow-up Dr. Margrette if your symptoms persist or worsen.  Your x-rays today are reassuring.

## 2024-07-03 ENCOUNTER — Encounter (HOSPITAL_COMMUNITY): Payer: Self-pay | Admitting: Emergency Medicine

## 2024-07-03 ENCOUNTER — Other Ambulatory Visit: Payer: Self-pay

## 2024-07-03 ENCOUNTER — Emergency Department (HOSPITAL_COMMUNITY)

## 2024-07-03 ENCOUNTER — Emergency Department (HOSPITAL_COMMUNITY)
Admission: EM | Admit: 2024-07-03 | Discharge: 2024-07-03 | Disposition: A | Discharge status: Home / Self Care | Attending: Emergency Medicine | Admitting: Emergency Medicine

## 2024-07-03 DIAGNOSIS — I10 Essential (primary) hypertension: Secondary | ICD-10-CM | POA: Insufficient documentation

## 2024-07-03 DIAGNOSIS — Z7982 Long term (current) use of aspirin: Secondary | ICD-10-CM | POA: Diagnosis not present

## 2024-07-03 DIAGNOSIS — R0789 Other chest pain: Secondary | ICD-10-CM | POA: Insufficient documentation

## 2024-07-03 DIAGNOSIS — Z79899 Other long term (current) drug therapy: Secondary | ICD-10-CM | POA: Insufficient documentation

## 2024-07-03 LAB — BASIC METABOLIC PANEL WITH GFR
Anion gap: 9 (ref 5–15)
BUN: 12 mg/dL (ref 6–20)
CO2: 21 mmol/L — ABNORMAL LOW (ref 22–32)
Calcium: 9.1 mg/dL (ref 8.9–10.3)
Chloride: 109 mmol/L (ref 98–111)
Creatinine, Ser: 0.99 mg/dL (ref 0.44–1.00)
GFR, Estimated: >60 mL/min (ref >60)
Glucose, Bld: 109 mg/dL — ABNORMAL HIGH (ref 70–99)
Potassium: 3.5 mmol/L (ref 3.5–5.1)
Sodium: 139 mmol/L (ref 135–145)

## 2024-07-03 LAB — CBC
HCT: 43.1 % (ref 36.0–46.0)
Hemoglobin: 14.5 g/dL (ref 12.0–15.0)
MCH: 28.4 pg (ref 26.0–34.0)
MCHC: 33.6 g/dL (ref 30.0–36.0)
MCV: 84.3 fL (ref 80.0–100.0)
Platelets: 230 K/uL (ref 150–400)
RBC: 5.11 MIL/uL (ref 3.87–5.11)
RDW: 14.7 % (ref 11.5–15.5)
WBC: 5.6 K/uL (ref 4.0–10.5)
nRBC: 0 % (ref 0.0–0.2)

## 2024-07-03 LAB — TROPONIN I (HIGH SENSITIVITY): Troponin I (High Sensitivity): 2 ng/L (ref <18)

## 2024-07-03 MED ORDER — IBUPROFEN 600 MG PO TABS: 600.0000 mg | ORAL_TABLET | Freq: Four times a day (QID) | ORAL | 0 refills | Status: DC | PRN

## 2024-07-03 MED ORDER — KETOROLAC TROMETHAMINE 30 MG/ML IJ SOLN
30.0000 mg | Freq: Once | INTRAMUSCULAR | Status: AC
  Administered 2024-07-03: 30 mg via INTRAVENOUS
  Filled 2024-07-03: qty 1

## 2024-07-03 NOTE — ED Triage Notes (Signed)
 Pov c/o CP and SOB that started last night and progressively gotten worse. States pain is central to chest. Denies n/v

## 2024-07-03 NOTE — ED Notes (Signed)
 Patient transported to X-ray

## 2024-07-03 NOTE — Discharge Instructions (Signed)
 You were seen today for intermittent chest pain.  This may be related to chest wall pain or muscular pain.  Trial ibuprofen  every 6 hours for the next 3 to 5 days to see if this helps.
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
## 2024-07-03 NOTE — ED Notes (Signed)
 Ambulated pt around the nurses station  O2 sat 96-99% RA  Pulse mid 80s   EDP made aware

## 2024-07-03 NOTE — ED Notes (Signed)
 ED Provider at bedside.

## 2024-07-23 ENCOUNTER — Ambulatory Visit
Admission: EM | Admit: 2024-07-23 | Discharge: 2024-07-23 | Disposition: A | Discharge status: Home / Self Care | Attending: Family Medicine | Admitting: Family Medicine

## 2024-07-23 DIAGNOSIS — H5713 Ocular pain, bilateral: Secondary | ICD-10-CM

## 2024-07-23 DIAGNOSIS — H1032 Unspecified acute conjunctivitis, left eye: Secondary | ICD-10-CM

## 2024-07-23 MED ORDER — OLOPATADINE HCL 0.1 % OP SOLN: 1.0000 [drp] | Freq: Two times a day (BID) | OPHTHALMIC | 0 refills | Status: AC

## 2024-07-23 MED ORDER — ERYTHROMYCIN 5 MG/GM OP OINT: TOPICAL_OINTMENT | OPHTHALMIC | 0 refills | Status: DC

## 2024-07-23 NOTE — ED Provider Notes (Signed)
 [facility]    CSN: [id number] Arrival date & time: 07/23/24  1019      History   Chief Complaint No chief complaint on file.   HPI Vicki [name] is a 58 y.o. female.   Patient presenting today with about 3 weeks of irritation, clear drainage, redness to eyes and now woke up the past day with more pain, crusting, matting to the eyes.  Trying over-the-counter Visine with minimal relief.  Denies visual change, headache, nausea, vomiting, direct injury to the eye.  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    Past Medical History:  Diagnosis Date   Abnormal Pap smear    Anemia    Anxiety 2020   BV (bacterial vaginosis) 05/16/2013   Depression 2020   Dysrhythmia    Fibroids    uterine   Hypertension    Mental disorder    depression and panic attacks   Tachycardia 2018   pt states for the past 4 years she has went in and out of tachycardia    Patient Active Problem List   Diagnosis Date Noted   Low back pain 02/01/2023   Mild major depression, single episode (HCC) 02/23/2022   Avascular necrosis of hip, left (HCC) 10/06/2021   Status post left hip replacement 10/06/2021   Noncompliance with medication regimen 05/03/2021   S/P abdominal hysterectomy 02/25/2014   Depression 07/25/2013   Panic attacks 07/25/2013   Fibroids 05/16/2013   BV (bacterial vaginosis) 05/16/2013   Anemia 04/03/2012   Menorrhagia 04/03/2012   Hypokalemia 04/03/2012   Lower urinary tract infectious disease 04/03/2012   Emesis 04/03/2012    Past Surgical History:  Procedure Laterality Date   ABDOMINAL HYSTERECTOMY     BILATERAL SALPINGECTOMY Bilateral 02/25/2014   Procedure: BILATERAL SALPINGECTOMY;  Surgeon: Vonn VEAR Inch, MD;  Location: [facility];  Service: Gynecology;  Laterality: Bilateral;   DILATION AND CURETTAGE OF UTERUS     SUPRACERVICAL ABDOMINAL HYSTERECTOMY N/A 02/25/2014   Procedure:  HYSTERECTOMY SUPRACERVICAL ABDOMINAL;  Surgeon: Vonn VEAR Inch, MD;  Location: [facility];  Service: Gynecology;  Laterality: N/A;   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   Procedure: LEFT TOTAL HIP ARTHROPLASTY ANTERIOR APPROACH;  Surgeon: Vernetta Lonni GRADE, MD;  Location: [facility];  Service: Orthopedics;  Laterality: Left;   TUBAL LIGATION      OB History     Gravida  7   Para  5   Term      Preterm      AB  2   Living  5      SAB  2   IAB      Ectopic      Multiple      Live Births               Home Medications    Prior to Admission medications   Medication Sig Start Date End Date Taking? Authorizing Provider  erythromycin  ophthalmic ointment Place a 1/2 inch ribbon of ointment into bilateral lower eyelid BID. 07/23/24  Yes Stuart Vernell Norris, PA-C  olopatadine  (PATADAY ) 0.1 % ophthalmic solution Place 1 drop into both eyes 2 (two) times daily. 07/23/24  Yes Stuart Vernell Norris, PA-C  acetaminophen  (TYLENOL ) 325 MG tablet Take 2 tablets (650 mg total) by mouth every 6 (six) hours as needed. 05/14/24   Elnor Jayson LABOR, DO  albuterol  (VENTOLIN  HFA) 108 (90 Base) MCG/ACT inhaler Inhale  2 puffs into the lungs every 4 (four) hours as needed for wheezing or shortness of breath. 07/10/23   Haze Lonni PARAS, MD  amLODipine  (NORVASC ) 5 MG tablet Take 1 tablet (5 mg total) by mouth daily. 03/10/21   Cleotilde Rogue, MD  aspirin  81 MG chewable tablet Chew 1 tablet (81 mg total) by mouth 2 (two) times daily. 10/06/21   Vernetta Lonni GRADE, MD  carvedilol  (COREG ) 3.125 MG tablet Take 1 tablet (3.125 mg total) by mouth 2 (two) times daily with a meal. 08/25/20   Idol, Mliss, PA-C  celecoxib  (CELEBREX ) 200 MG capsule Take 1 capsule (200 mg total) by mouth 2 (two) times daily. 03/31/24   Feldpausch, Abigail, PA-C  cholecalciferol  (VITAMIN D3) 25 MCG (1000 UNIT) tablet Take 1,000 Units by mouth daily.    [provider]  citalopram  (CELEXA ) 40 MG tablet Take 40 mg by  mouth daily. 09/18/21   [provider]  furosemide  (LASIX ) 20 MG tablet Take 1 tablet (20 mg total) by mouth daily. 02/21/24 03/22/24  Stuart Vernell Norris, PA-C  gabapentin  (NEURONTIN ) 300 MG capsule Take 1 capsule (300 mg total) by mouth at bedtime. 01/31/24   Margrette Taft BRAVO, MD  HYDROcodone -acetaminophen  (NORCO/VICODIN) 5-325 MG tablet Take 1 tablet by mouth every 6 (six) hours as needed for severe pain (pain score 7-10). 06/24/24   Idol, Julie, PA-C  hydrOXYzine  (ATARAX ) 25 MG tablet Take 25 mg by mouth 3 (three) times daily as needed for anxiety. 04/27/22   [provider]  ibuprofen  (ADVIL ) 600 MG tablet Take 1 tablet (600 mg total) by mouth every 6 (six) hours as needed. 07/03/24   Horton, Charmaine FALCON, MD  lidocaine  (LIDODERM ) 5 % Place 1 patch onto the skin daily as needed. Remove & Discard patch within 12 hours or as directed by MD 05/14/24   Elnor Jayson LABOR, DO  methylPREDNISolone  (MEDROL  DOSEPAK) 4 MG TBPK tablet Use as directed 03/31/24   Raphael, Abigail, PA-C  naproxen  (NAPROSYN ) 500 MG tablet Take 1 tablet (500 mg total) by mouth 2 (two) times daily. 06/02/24   Geroldine Berg, MD  oxyCODONE  (ROXICODONE ) 5 MG immediate release tablet Take 1 tablet (5 mg total) by mouth every 4 (four) hours as needed for severe pain (pain score 7-10). 05/14/24   Elnor Jayson A, DO  predniSONE  (DELTASONE ) 10 MG tablet 6, 5, 4, 3, 2 then 1 tablet by mouth daily for 6 days total. 06/24/24   Idol, Julie, PA-C  simvastatin (ZOCOR) 20 MG tablet Take 20 mg by mouth daily. 03/16/22   [provider]  tiZANidine  (ZANAFLEX ) 4 MG tablet Take 1 tablet (4 mg total) by mouth every 6 (six) hours as needed for muscle spasms. 05/14/24   Elnor Jayson LABOR, DO  tiZANidine  (ZANAFLEX ) 4 MG tablet TAKE 1 TABLET BY MOUTH EVERY 6 HOURS AS NEEDED FOR MUSCLE SPASM 06/11/24   Margrette Taft BRAVO, MD  traMADol  (ULTRAM ) 50 MG tablet Take 1 tablet (50 mg total) by mouth every 6 (six) hours as needed. 06/02/24   Geroldine Berg, MD     Family History Family History  Problem Relation Age of Onset   Congestive Heart Failure Mother    Congestive Heart Failure Maternal Grandmother    Cancer Paternal Grandmother        ovarian    Social History Social History   Tobacco Use   Smoking status: Never   Smokeless tobacco: Never  Vaping Use   Vaping status: Never Used  Substance Use Topics  Alcohol use: Yes    Comment: 1 beer daily    Drug use: No     Allergies   Patient has no known allergies.   Review of Systems Review of Systems PER HPI  Physical Exam Triage Vital Signs ED Triage Vitals  Encounter Vitals Group     BP 07/23/24 1023 (!) 162/79     Girls Systolic BP Percentile --      Girls Diastolic BP Percentile --      Boys Systolic BP Percentile --      Boys Diastolic BP Percentile --      Pulse Rate 07/23/24 1023 92     Resp 07/23/24 1023 16     Temp 07/23/24 1023 98.7 F (37.1 C)     Temp Source 07/23/24 1023 Oral     SpO2 07/23/24 1023 98 %     Weight --      Height --      Head Circumference --      Peak Flow --      Pain Score 07/23/24 1026 6     Pain Loc --      Pain Education --      Exclude from Growth Chart --    No data found.  Updated Vital Signs BP (!) 162/79 (BP Location: Right Arm)   Pulse 92   Temp 98.7 F (37.1 C) (Oral)   Resp 16   LMP 02/09/2014   SpO2 98%   Visual Acuity Right Eye Distance: 20/50 Left Eye Distance: 20/50 Bilateral Distance: 20/40  Right Eye Near:   Left Eye Near:    Bilateral Near:     Physical Exam Vitals and nursing note reviewed.  Constitutional:      Appearance: Normal appearance. She is not ill-appearing.  HENT:     Head: Atraumatic.     Mouth/Throat:     Mouth: Mucous membranes are moist.  Eyes:     Extraocular Movements: Extraocular movements intact.     Pupils: Pupils are equal, round, and reactive to light.     Comments: Bilateral conjunctival injection.  No foreign bodies appreciable on exam  Cardiovascular:      Rate and Rhythm: Normal rate.  Pulmonary:     Effort: Pulmonary effort is normal.  Musculoskeletal:        General: Normal range of motion.     Cervical back: Normal range of motion and neck supple.  Skin:    General: Skin is warm and dry.  Neurological:     Mental Status: She is alert and oriented to person, place, and time.  Psychiatric:        Mood and Affect: Mood normal.        Thought Content: Thought content normal.        Judgment: Judgment normal.      UC Treatments / Results  Labs (all labs ordered are listed, but only abnormal results are displayed) Labs Reviewed - No data to display  EKG   Radiology No results found.  Procedures Procedures (including critical care time)  Medications Ordered in UC Medications - No data to display  Initial Impression / Assessment and Plan / UC Course  I have reviewed the triage vital signs and the nursing notes.  Pertinent labs & imaging results that were available during my care of the patient were reviewed by me and considered in my medical decision making (see chart for details).     Visual acuity reassuring today, and at her baseline  per patient.  Suspect irritant/allergic conjunctivitis now with some possible secondary bacterial infection.  Treat with Pataday  drops, erythromycin  ointment, cool compresses, good handwashing.  Work note given.  Return for worsening symptoms.  Final Clinical Impressions(s) / UC Diagnoses   Final diagnoses:  Eye pain, bilateral  Acute bacterial conjunctivitis of left eye     Discharge Instructions      I have prescribed Pataday  drops for ongoing inflammation and irritation and erythromycin  ointment to treat a possible bacterial infection secondary to all of the irritation.  You may stop the medications once your symptoms have resolved.  Apply cool compresses, wash her hands before and after touching the eyes, ibuprofen  and Tylenol  as needed for pain.  Follow-up if worsening or not  resolving.     ED Prescriptions     Medication Sig Dispense Auth. Provider   erythromycin  ophthalmic ointment Place a 1/2 inch ribbon of ointment into bilateral lower eyelid BID. 3.5 g Mckaela Howley Elizabeth, PA-C   olopatadine  (PATADAY ) 0.1 % ophthalmic solution Place 1 drop into both eyes 2 (two) times daily. 5 mL Stuart Vernell Norris, [place]      PDMP not reviewed this encounter.   Stuart Vernell Norris, [place] 07/23/24 1220

## 2024-07-23 NOTE — Discharge Instructions (Signed)
 I have prescribed Pataday  drops for ongoing inflammation and irritation and erythromycin  ointment to treat a possible bacterial infection secondary to all of the irritation.  You may stop the medications once your symptoms have resolved.  Apply cool compresses, wash her hands before and after touching the eyes, ibuprofen  and Tylenol  as needed for pain.  Follow-up if worsening or not resolving.

## 2024-07-23 NOTE — ED Triage Notes (Signed)
 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   States she woke up with crust and pain in her eyes.   Has tried visine but no relief

## 2024-07-31 ENCOUNTER — Other Ambulatory Visit: Payer: Self-pay

## 2024-07-31 ENCOUNTER — Encounter: Payer: Self-pay | Admitting: Emergency Medicine

## 2024-07-31 ENCOUNTER — Ambulatory Visit
Admission: EM | Admit: 2024-07-31 | Discharge: 2024-07-31 | Disposition: A | Discharge status: Home / Self Care | Attending: Nurse Practitioner | Admitting: Nurse Practitioner

## 2024-07-31 DIAGNOSIS — Z8659 Personal history of other mental and behavioral disorders: Secondary | ICD-10-CM | POA: Diagnosis not present

## 2024-07-31 DIAGNOSIS — F410 Panic disorder [episodic paroxysmal anxiety] without agoraphobia: Secondary | ICD-10-CM | POA: Diagnosis not present

## 2024-07-31 NOTE — Discharge Instructions (Signed)
 Your EKG was negative. As discussed, you may take hydroxyzine  50 mg (2 tablets) as needed for increased anxiety or panic attacks.  Increase fluids and allow for plenty of rest. Go to the emergency department if you have increased anxiety not controlled by medications, worsening shortness of breath, difficulty breathing, palpitations, or other concerns. Follow-up as needed.
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
## 2024-07-31 NOTE — ED Provider Notes (Signed)
 [facility]    CSN: [id number] Arrival date & time: 07/31/24  0849      History   Chief Complaint No chief complaint on file.   HPI Vicki [name] is a 58 y.o. female.   The history is provided by the patient.   Patient presents for complaints of shortness of breath, nervousness, and chest pain that started over the past 24 hours.  Patient states symptoms started after her granddaughter took her car.  Patient states that the chest pain has since improved, she also states that her shortness of breath has improved.  Patient reports prior history of anxiety and panic attacks.  She also has a history of depression.  States that she currently takes Celexa  and hydroxyzine  for her symptoms.  States that she did take hydroxyzine  around 1 AM this morning, states she has not had any medication since that time.  She denies fever, chills, difficulty breathing, wheezing, agitation, abdominal pain, nausea, vomiting, or diarrhea.  Patient states that her symptoms appear to be improving.  States that she has since spoken with her granddaughter.  Past Medical History:  Diagnosis Date   Abnormal Pap smear    Anemia    Anxiety 2020   BV (bacterial vaginosis) 05/16/2013   Depression 2020   Dysrhythmia    Fibroids    uterine   Hypertension    Mental disorder    depression and panic attacks   Tachycardia 2018   pt states for the past 4 years she has went in and out of tachycardia    Patient Active Problem List   Diagnosis Date Noted   Low back pain 02/01/2023   Mild major depression, single episode (HCC) 02/23/2022   Avascular necrosis of hip, left (HCC) 10/06/2021   Status post left hip replacement 10/06/2021   Noncompliance with medication regimen 05/03/2021   S/P abdominal hysterectomy 02/25/2014   Depression 07/25/2013   Panic attacks 07/25/2013   Fibroids 05/16/2013   BV (bacterial vaginosis) 05/16/2013   Anemia 04/03/2012   Menorrhagia 04/03/2012   Hypokalemia  04/03/2012   Lower urinary tract infectious disease 04/03/2012   Emesis 04/03/2012    Past Surgical History:  Procedure Laterality Date   ABDOMINAL HYSTERECTOMY     BILATERAL SALPINGECTOMY Bilateral 02/25/2014   Procedure: BILATERAL SALPINGECTOMY;  Surgeon: Vonn VEAR Inch, MD;  Location: [facility];  Service: Gynecology;  Laterality: Bilateral;   DILATION AND CURETTAGE OF UTERUS     SUPRACERVICAL ABDOMINAL HYSTERECTOMY N/A 02/25/2014   Procedure: HYSTERECTOMY SUPRACERVICAL ABDOMINAL;  Surgeon: Vonn VEAR Inch, MD;  Location: [facility];  Service: Gynecology;  Laterality: N/A;   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   Procedure: LEFT TOTAL HIP ARTHROPLASTY ANTERIOR APPROACH;  Surgeon: Vernetta Lonni GRADE, MD;  Location: [facility];  Service: Orthopedics;  Laterality: Left;   TUBAL LIGATION      OB History     Gravida  7   Para  5   Term      Preterm      AB  2   Living  5      SAB  2   IAB      Ectopic      Multiple      Live Births               Home Medications    Prior to Admission medications   Medication Sig Start Date End Date Taking? Authorizing Provider  acetaminophen  (TYLENOL ) 325 MG tablet Take 2 tablets (650  mg total) by mouth every 6 (six) hours as needed. 05/14/24   Elnor Jayson LABOR, DO  albuterol  (VENTOLIN  HFA) 108 (90 Base) MCG/ACT inhaler Inhale 2 puffs into the lungs every 4 (four) hours as needed for wheezing or shortness of breath. 07/10/23   Haze Lonni PARAS, MD  amLODipine  (NORVASC ) 5 MG tablet Take 1 tablet (5 mg total) by mouth daily. 03/10/21   Cleotilde Rogue, MD  aspirin  81 MG chewable tablet Chew 1 tablet (81 mg total) by mouth 2 (two) times daily. 10/06/21   Vernetta Lonni GRADE, MD  carvedilol  (COREG ) 3.125 MG tablet Take 1 tablet (3.125 mg total) by mouth 2 (two) times daily with a meal. 08/25/20   Idol, Mliss, PA-C  celecoxib  (CELEBREX ) 200 MG capsule Take 1 capsule (200 mg total) by mouth 2 (two) times daily. 03/31/24   Mcelhaney, Abigail, PA-C   cholecalciferol  (VITAMIN D3) 25 MCG (1000 UNIT) tablet Take 1,000 Units by mouth daily.    [provider]  citalopram  (CELEXA ) 40 MG tablet Take 40 mg by mouth daily. 09/18/21   [provider]  furosemide  (LASIX ) 20 MG tablet Take 1 tablet (20 mg total) by mouth daily. 02/21/24 03/22/24  Stuart Vernell Norris, PA-C  gabapentin  (NEURONTIN ) 300 MG capsule Take 1 capsule (300 mg total) by mouth at bedtime. 01/31/24   Margrette Taft BRAVO, MD  HYDROcodone -acetaminophen  (NORCO/VICODIN) 5-325 MG tablet Take 1 tablet by mouth every 6 (six) hours as needed for severe pain (pain score 7-10). 06/24/24   Idol, Julie, PA-C  hydrOXYzine  (ATARAX ) 25 MG tablet Take 25 mg by mouth 3 (three) times daily as needed for anxiety. 04/27/22   [provider]  ibuprofen  (ADVIL ) 600 MG tablet Take 1 tablet (600 mg total) by mouth every 6 (six) hours as needed. 07/03/24   Horton, Charmaine FALCON, MD  lidocaine  (LIDODERM ) 5 % Place 1 patch onto the skin daily as needed. Remove & Discard patch within 12 hours or as directed by MD 05/14/24   Elnor Jayson A, DO  naproxen  (NAPROSYN ) 500 MG tablet Take 1 tablet (500 mg total) by mouth 2 (two) times daily. 06/02/24   Geroldine Berg, MD  olopatadine  (PATADAY ) 0.1 % ophthalmic solution Place 1 drop into both eyes 2 (two) times daily. 07/23/24   Stuart Vernell Norris, PA-C  oxyCODONE  (ROXICODONE ) 5 MG immediate release tablet Take 1 tablet (5 mg total) by mouth every 4 (four) hours as needed for severe pain (pain score 7-10). 05/14/24   Elnor Jayson LABOR, DO  predniSONE  (DELTASONE ) 10 MG tablet 6, 5, 4, 3, 2 then 1 tablet by mouth daily for 6 days total. 06/24/24   Idol, Julie, PA-C  simvastatin (ZOCOR) 20 MG tablet Take 20 mg by mouth daily. 03/16/22   [provider]  tiZANidine  (ZANAFLEX ) 4 MG tablet Take 1 tablet (4 mg total) by mouth every 6 (six) hours as needed for muscle spasms. 05/14/24   Elnor Jayson A, DO  tiZANidine  (ZANAFLEX ) 4 MG tablet TAKE 1 TABLET BY MOUTH  EVERY 6 HOURS AS NEEDED FOR MUSCLE SPASM 06/11/24   Margrette Taft BRAVO, MD  traMADol  (ULTRAM ) 50 MG tablet Take 1 tablet (50 mg total) by mouth every 6 (six) hours as needed. 06/02/24   Geroldine Berg, MD    Family History Family History  Problem Relation Age of Onset   Congestive Heart Failure Mother    Congestive Heart Failure Maternal Grandmother    Cancer Paternal Grandmother        ovarian  Social History Social History   Tobacco Use   Smoking status: Never   Smokeless tobacco: Never  Vaping Use   Vaping status: Never Used  Substance Use Topics   Alcohol use: Yes    Comment: 1 beer daily    Drug use: No     Allergies   Patient has no known allergies.   Review of Systems Review of Systems Per HPI  Physical Exam Triage Vital Signs ED Triage Vitals  Encounter Vitals Group     BP 07/31/24 0926 (!) 183/79     Girls Systolic BP Percentile --      Girls Diastolic BP Percentile --      Boys Systolic BP Percentile --      Boys Diastolic BP Percentile --      Pulse Rate 07/31/24 0926 78     Resp 07/31/24 0926 18     Temp 07/31/24 0926 98.1 F (36.7 C)     Temp Source 07/31/24 0926 Oral     SpO2 07/31/24 0926 97 %     Weight --      Height --      Head Circumference --      Peak Flow --      Pain Score 07/31/24 0927 4     Pain Loc --      Pain Education --      Exclude from Growth Chart --    No data found.  Updated Vital Signs BP (!) 183/79 (BP Location: Right Arm)   Pulse 78   Temp 98.1 F (36.7 C) (Oral)   Resp 18   LMP 02/09/2014   SpO2 97%   Visual Acuity Right Eye Distance:   Left Eye Distance:   Bilateral Distance:    Right Eye Near:   Left Eye Near:    Bilateral Near:     Physical Exam Vitals and nursing note reviewed.  Constitutional:      General: She is not in acute distress.    Appearance: Normal appearance.  HENT:     Head: Normocephalic.  Eyes:     Extraocular Movements: Extraocular movements intact.     Pupils: Pupils  are equal, round, and reactive to light.  Cardiovascular:     Rate and Rhythm: Normal rate and regular rhythm.     Pulses: Normal pulses.     Heart sounds: Normal heart sounds.  Pulmonary:     Effort: Pulmonary effort is normal.     Breath sounds: Normal breath sounds.  Abdominal:     General: Bowel sounds are normal.     Palpations: Abdomen is soft.  Musculoskeletal:     Cervical back: Normal range of motion.  Skin:    General: Skin is warm and dry.  Neurological:     General: No focal deficit present.     Mental Status: She is alert and oriented to person, place, and time.  Psychiatric:        Attention and Perception: Attention and perception normal.        Mood and Affect: Mood is anxious.        Behavior: Behavior normal.        Thought Content: Thought content normal.        Cognition and Memory: Cognition and memory normal.      UC Treatments / Results  Labs (all labs ordered are listed, but only abnormal results are displayed) Labs Reviewed - No data to display  EKG: Normal sinus rhythm, no STEMI.  Compared to EKGs dated 07/03/2024, 06/02/2024, and 05/14/2024.   Radiology No results found.  Procedures Procedures (including critical care time)  Medications Ordered in UC Medications - No data to display  Initial Impression / Assessment and Plan / UC Course  I have reviewed the triage vital signs and the nursing notes.  Pertinent labs & imaging results that were available during my care of the patient were reviewed by me and considered in my medical decision making (see chart for details).  EKG shows normal sinus rhythm, no STEMI.  Symptoms most likely due to a panic attack.  Patient was advised to continue her current medications to include Celexa  and hydroxyzine .  Patient was advised that for severe panic attacks or anxiety, she can take hydroxyzine  50 mg as needed.  Supportive care recommendations were provided discussed with the patient to include fluids and  rest.  Patient was given strict ER follow-up precaution.  Patient was in agreement with this plan of care and verbalizes understanding.  All questions were answered.  Patient stable for discharge.  Final Clinical Impressions(s) / UC Diagnoses   Final diagnoses:  None   Discharge Instructions   None    ED Prescriptions   None    PDMP not reviewed this encounter.   Gilmer Etta PARAS, NP 07/31/24 1006

## 2024-08-11 ENCOUNTER — Telehealth: Payer: Self-pay | Admitting: Orthopedic Surgery

## 2024-08-11 DIAGNOSIS — M545 Low back pain, unspecified: Secondary | ICD-10-CM

## 2024-09-04 ENCOUNTER — Other Ambulatory Visit: Payer: Self-pay

## 2024-09-04 ENCOUNTER — Ambulatory Visit
Admission: EM | Admit: 2024-09-04 | Discharge: 2024-09-04 | Disposition: A | Discharge status: Home / Self Care | Attending: Family Medicine | Admitting: Family Medicine

## 2024-09-04 ENCOUNTER — Encounter: Payer: Self-pay | Admitting: Emergency Medicine

## 2024-09-04 DIAGNOSIS — M545 Low back pain, unspecified: Secondary | ICD-10-CM

## 2024-09-04 DIAGNOSIS — R350 Frequency of micturition: Secondary | ICD-10-CM | POA: Diagnosis not present

## 2024-09-04 LAB — POCT URINE DIPSTICK
Bilirubin, UA: NEGATIVE
Blood, UA: NEGATIVE
Glucose, UA: NEGATIVE mg/dL
Ketones, POC UA: NEGATIVE mg/dL
Leukocytes, UA: NEGATIVE
Nitrite, UA: NEGATIVE
POC PROTEIN,UA: NEGATIVE
Spec Grav, UA: >=1.03 — AB (ref 1.010–1.025)
Urobilinogen, UA: 0.2 U/dL
pH, UA: 5.5 (ref 5.0–8.0)

## 2024-09-04 LAB — GLUCOSE, POCT (MANUAL RESULT ENTRY): POC Glucose: 106 mg/dL — AB (ref 70–99)

## 2024-09-04 MED ORDER — METHOCARBAMOL 500 MG PO TABS: 500.0000 mg | ORAL_TABLET | Freq: Two times a day (BID) | ORAL | 0 refills | Status: DC

## 2024-09-04 MED ORDER — OXYBUTYNIN CHLORIDE ER 5 MG PO TB24: 5.0000 mg | ORAL_TABLET | Freq: Every day | ORAL | 0 refills | Status: DC

## 2024-09-04 NOTE — ED Triage Notes (Signed)
 Pt reports lower back pain, urinary frequency,dysuria x3 days. Pt reports pain increased today. Denies any change in symptoms with otc medications.denies having tried AZO.

## 2024-09-04 NOTE — ED Provider Notes (Signed)
 [facility]   [id number] 09/04/24 Arrival Time: 0831  ASSESSMENT & PLAN:  1. Urinary frequency   2. Acute bilateral low back pain, unspecified whether sciatica present    LBP likely MSK in nature. H/O similar. Trial of: Meds ordered this encounter  Medications   methocarbamol  (ROBAXIN ) 500 MG tablet    Sig: Take 1 tablet (500 mg total) by mouth 2 (two) times daily.    Dispense:  20 tablet    Refill:  0   Ques overactive bladder given symptoms. No signs of infection on U/A. CBG ok. Trial of: Meds ordered this encounter  Medications   oxybutynin  (DITROPAN  XL) 5 MG 24 hr tablet    Sig: Take 1 tablet (5 mg total) by mouth at bedtime.    Dispense:  30 tablet    Refill:  0    Results for orders placed or performed during the hospital encounter of 09/04/24  POCT URINE DIPSTICK   Collection Time: 09/04/24  9:13 AM  Result Value Ref Range   Color, UA yellow yellow   Clarity, UA clear clear   Glucose, UA negative negative mg/dL   Bilirubin, UA negative negative   Ketones, POC UA negative negative mg/dL   Spec Grav, UA >=8.969 (A) 1.010 - 1.025   Blood, UA negative negative   pH, UA 5.5 5.0 - 8.0   POC PROTEIN,UA negative negative, trace   Urobilinogen, UA 0.2 0.2 or 1.0 E.U./dL   Nitrite, UA Negative Negative   Leukocytes, UA Negative Negative  POCT CBG (manual entry)   Collection Time: 09/04/24  9:29 AM  Result Value Ref Range   POC Glucose 106 (A) 70 - 99 mg/dl   OTC symptom care as needed. Work note provided.   Follow-up Information     Schedule an appointment as soon as possible for a visit  with Luke Agent, MD.   Specialty: Internal Medicine Why: For follow up. Contact information: [address] [place] [address] [phone]                 Reviewed expectations re: course of current medical issues. Questions answered. Outlined signs and symptoms indicating need for more acute intervention. Understanding  verbalized. After Visit Summary given.   SUBJECTIVE: History from: Patient. Vicki [name] is a 58 y.o. female. Pt reports lower back pain, urinary frequency,dysuria x3 days. Pt reports pain increased today. Denies any change in symptoms with otc medications.denies having tried AZO.  Does have to get up during night to urinate. Many months. Does drink significant caffeine. Occas incontinence.  OBJECTIVE:  Vitals:   09/04/24 0902  BP: (!) 159/77  Pulse: 63  Resp: 18  Temp: 98.3 F (36.8 C)  TempSrc: Oral  SpO2: 97%    General appearance: alert; no distress Abd: soft; NT Back: generalized soreness over lower back musculature; without midline TTP Extremities: no edema Skin: warm and dry Neurologic: normal gait Psychological: alert and cooperative; normal mood and affect  Labs: Results for orders placed or performed during the hospital encounter of 09/04/24  POCT URINE DIPSTICK   Collection Time: 09/04/24  9:13 AM  Result Value Ref Range   Color, UA yellow yellow   Clarity, UA clear clear   Glucose, UA negative negative mg/dL   Bilirubin, UA negative negative   Ketones, POC UA negative negative mg/dL   Spec Grav, UA >=8.969 (A) 1.010 - 1.025   Blood, UA negative negative   pH, UA 5.5 5.0 - 8.0  POC PROTEIN,UA negative negative, trace   Urobilinogen, UA 0.2 0.2 or 1.0 E.U./dL   Nitrite, UA Negative Negative   Leukocytes, UA Negative Negative  POCT CBG (manual entry)   Collection Time: 09/04/24  9:29 AM  Result Value Ref Range   POC Glucose 106 (A) 70 - 99 mg/dl   Labs Reviewed  POCT URINE DIPSTICK - Abnormal; Notable for the following components:      Result Value   Spec Grav, UA >=1.030 (*)    All other components within normal limits  GLUCOSE, POCT (MANUAL RESULT ENTRY) - Abnormal; Notable for the following components:   POC Glucose 106 (*)    All other components within normal limits    Imaging: No results found.  No Known Allergies  Past Medical  History:  Diagnosis Date   Abnormal Pap smear    Anemia    Anxiety 2020   BV (bacterial vaginosis) 05/16/2013   Depression 2020   Dysrhythmia    Fibroids    uterine   Hypertension    Mental disorder    depression and panic attacks   Tachycardia 2018   pt states for the past 4 years she has went in and out of tachycardia   Social History   Socioeconomic History   Marital status: Legally Separated    Spouse name: Not on file   Number of children: Not on file   Years of education: Not on file   Highest education level: Not on file  Occupational History   Not on file  Tobacco Use   Smoking status: Never   Smokeless tobacco: Never  Vaping Use   Vaping status: Never Used  Substance and Sexual Activity   Alcohol use: Yes    Comment: 1 beer daily    Drug use: No   Sexual activity: Not Currently    Birth control/protection: Surgical  Other Topics Concern   Not on file  Social History Narrative   Not on file   Social Drivers of Health   Financial Resource Strain: Not on file  Food Insecurity: Not on file  Transportation Needs: Not on file  Physical Activity: Not on file  Stress: Not on file  Social Connections: Not on file  Intimate Partner Violence: Not on file   Family History  Problem Relation Age of Onset   Congestive Heart Failure Mother    Congestive Heart Failure Maternal Grandmother    Cancer Paternal Grandmother        ovarian   Past Surgical History:  Procedure Laterality Date   ABDOMINAL HYSTERECTOMY     BILATERAL SALPINGECTOMY Bilateral 02/25/2014   Procedure: BILATERAL SALPINGECTOMY;  Surgeon: Vonn VEAR Inch, MD;  Location: [facility];  Service: Gynecology;  Laterality: Bilateral;   DILATION AND CURETTAGE OF UTERUS     SUPRACERVICAL ABDOMINAL HYSTERECTOMY N/A 02/25/2014   Procedure: HYSTERECTOMY SUPRACERVICAL ABDOMINAL;  Surgeon: Vonn VEAR Inch, MD;  Location: [facility];  Service: Gynecology;  Laterality: N/A;   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    Procedure: LEFT TOTAL HIP ARTHROPLASTY ANTERIOR APPROACH;  Surgeon: Vernetta Lonni GRADE, MD;  Location: [facility];  Service: Orthopedics;  Laterality: Left;   TUBAL LIGATION       Rolinda Rogue, MD 09/04/24 1012

## 2024-09-16 ENCOUNTER — Encounter: Payer: Self-pay | Admitting: Emergency Medicine

## 2024-09-16 ENCOUNTER — Ambulatory Visit
Admission: EM | Admit: 2024-09-16 | Discharge: 2024-09-16 | Disposition: A | Discharge status: Home / Self Care | Attending: Nurse Practitioner | Admitting: Nurse Practitioner

## 2024-09-16 ENCOUNTER — Other Ambulatory Visit: Payer: Self-pay

## 2024-09-16 DIAGNOSIS — Z8659 Personal history of other mental and behavioral disorders: Secondary | ICD-10-CM | POA: Diagnosis not present

## 2024-09-16 DIAGNOSIS — F410 Panic disorder [episodic paroxysmal anxiety] without agoraphobia: Secondary | ICD-10-CM

## 2024-09-16 MED ORDER — HYDROXYZINE HCL 50 MG PO TABS: 50.0000 mg | ORAL_TABLET | Freq: Two times a day (BID) | ORAL | 0 refills | Status: DC | PRN

## 2024-09-16 NOTE — ED Triage Notes (Signed)
 SOB since Saturday.  States she feels like she is having a panic attack states nephew was killed 2 days ago.

## 2024-09-16 NOTE — Discharge Instructions (Signed)
 Your EKG was negative. Take medication as prescribed. Increase fluids and allow for plenty of rest. Go to the emergency department if you have increased anxiety not controlled by medications, worsening shortness of breath, difficulty breathing, palpitations, or other concerns. Also recommend follow-up at the behavioral health hospital if symptoms worsen.  Their address is: [address] [place], [place] , [address] [phone] You may follow-up with your primary care physician as needed. Follow-up as needed.

## 2024-09-16 NOTE — ED Provider Notes (Signed)
 [facility]    CSN: [id number] Arrival date & time: 09/16/24  0840      History   Chief Complaint No chief complaint on file.   HPI Vicki [name] is a 58 y.o. female.   The history is provided by the patient.   Patient presents for complaints of shortness of breath, anxiety, and panic attack.  Patient states symptoms started 2 days ago, states that her nephew who was 27 years old was killed in a car accident in [place] .  She states that she is experiencing continued shortness of breath.  States that she is also having difficulty sleeping.  States appetite has been stable.  Patient states that she currently takes Celexa  and hydroxyzine , but states that neither of the medications are working.  She states that she is having episodes of tearfulness when she thinks about her nephew.  Patient denies any other triggers for her panic at this time.  She denies chest pain, palpitations, feelings of hopelessness, worthlessness, mania, mood swings,AH/VH, or suicidal or homicidal ideations.  Past Medical History:  Diagnosis Date   Abnormal Pap smear    Anemia    Anxiety 2020   BV (bacterial vaginosis) 05/16/2013   Depression 2020   Dysrhythmia    Fibroids    uterine   Hypertension    Mental disorder    depression and panic attacks   Tachycardia 2018   pt states for the past 4 years she has went in and out of tachycardia    Patient Active Problem List   Diagnosis Date Noted   Low back pain 02/01/2023   Mild major depression, single episode 02/23/2022   Avascular necrosis of hip, left (HCC) 10/06/2021   Status post left hip replacement 10/06/2021   Noncompliance with medication regimen 05/03/2021   S/P abdominal hysterectomy 02/25/2014   Depression 07/25/2013   Panic attacks 07/25/2013   Fibroids 05/16/2013   BV (bacterial vaginosis) 05/16/2013   Anemia 04/03/2012   Menorrhagia 04/03/2012   Hypokalemia 04/03/2012   Lower urinary tract infectious disease  04/03/2012   Emesis 04/03/2012    Past Surgical History:  Procedure Laterality Date   ABDOMINAL HYSTERECTOMY     BILATERAL SALPINGECTOMY Bilateral 02/25/2014   Procedure: BILATERAL SALPINGECTOMY;  Surgeon: Vonn VEAR Inch, MD;  Location: [facility];  Service: Gynecology;  Laterality: Bilateral;   DILATION AND CURETTAGE OF UTERUS     SUPRACERVICAL ABDOMINAL HYSTERECTOMY N/A 02/25/2014   Procedure: HYSTERECTOMY SUPRACERVICAL ABDOMINAL;  Surgeon: Vonn VEAR Inch, MD;  Location: [facility];  Service: Gynecology;  Laterality: N/A;   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   Procedure: LEFT TOTAL HIP ARTHROPLASTY ANTERIOR APPROACH;  Surgeon: Vernetta Lonni GRADE, MD;  Location: [facility];  Service: Orthopedics;  Laterality: Left;   TUBAL LIGATION      OB History     Gravida  7   Para  5   Term      Preterm      AB  2   Living  5      SAB  2   IAB      Ectopic      Multiple      Live Births               Home Medications    Prior to Admission medications   Medication Sig Start Date End Date Taking? Authorizing Provider  acetaminophen  (TYLENOL ) 325 MG tablet Take 2 tablets (650 mg total) by mouth every 6 (six) hours as needed.  05/14/24   Elnor Jayson LABOR, DO  albuterol  (VENTOLIN  HFA) 108 (90 Base) MCG/ACT inhaler Inhale 2 puffs into the lungs every 4 (four) hours as needed for wheezing or shortness of breath. 07/10/23   Haze Lonni PARAS, MD  amLODipine  (NORVASC ) 5 MG tablet Take 1 tablet (5 mg total) by mouth daily. 03/10/21   Cleotilde Rogue, MD  aspirin  81 MG chewable tablet Chew 1 tablet (81 mg total) by mouth 2 (two) times daily. 10/06/21   Vernetta Lonni GRADE, MD  carvedilol  (COREG ) 3.125 MG tablet Take 1 tablet (3.125 mg total) by mouth 2 (two) times daily with a meal. 08/25/20   Idol, Mliss, PA-C  celecoxib  (CELEBREX ) 200 MG capsule Take 1 capsule (200 mg total) by mouth 2 (two) times daily. 03/31/24   Hurtubise, Lavanda, PA-C  cholecalciferol  (VITAMIN D3) 25 MCG (1000 UNIT)  tablet Take 1,000 Units by mouth daily.    [provider]  citalopram  (CELEXA ) 40 MG tablet Take 40 mg by mouth daily. 09/18/21   [provider]  furosemide  (LASIX ) 20 MG tablet Take 1 tablet (20 mg total) by mouth daily. 02/21/24 03/22/24  Stuart Vernell Norris, PA-C  gabapentin  (NEURONTIN ) 300 MG capsule Take 1 capsule (300 mg total) by mouth at bedtime. 01/31/24   Margrette Taft BRAVO, MD  HYDROcodone -acetaminophen  (NORCO/VICODIN) 5-325 MG tablet Take 1 tablet by mouth every 6 (six) hours as needed for severe pain (pain score 7-10). 06/24/24   Idol, Julie, PA-C  hydrOXYzine  (ATARAX ) 25 MG tablet Take 25 mg by mouth 3 (three) times daily as needed for anxiety. 04/27/22   [provider]  ibuprofen  (ADVIL ) 600 MG tablet Take 1 tablet (600 mg total) by mouth every 6 (six) hours as needed. 07/03/24   Horton, Charmaine FALCON, MD  lidocaine  (LIDODERM ) 5 % Place 1 patch onto the skin daily as needed. Remove & Discard patch within 12 hours or as directed by MD 05/14/24   Elnor Jayson A, DO  methocarbamol  (ROBAXIN ) 500 MG tablet Take 1 tablet (500 mg total) by mouth 2 (two) times daily. 09/04/24   Rolinda Rogue, MD  naproxen  (NAPROSYN ) 500 MG tablet Take 1 tablet (500 mg total) by mouth 2 (two) times daily. 06/02/24   Geroldine Berg, MD  olopatadine  (PATADAY ) 0.1 % ophthalmic solution Place 1 drop into both eyes 2 (two) times daily. 07/23/24   Stuart Vernell Norris, PA-C  oxybutynin  (DITROPAN  XL) 5 MG 24 hr tablet Take 1 tablet (5 mg total) by mouth at bedtime. 09/04/24   Rolinda Rogue, MD  oxyCODONE  (ROXICODONE ) 5 MG immediate release tablet Take 1 tablet (5 mg total) by mouth every 4 (four) hours as needed for severe pain (pain score 7-10). 05/14/24   Elnor Jayson LABOR, DO  simvastatin (ZOCOR) 20 MG tablet Take 20 mg by mouth daily. 03/16/22   [provider]  tiZANidine  (ZANAFLEX ) 4 MG tablet Take 1 tablet (4 mg total) by mouth every 6 (six) hours as needed for muscle spasms. 05/14/24   Elnor Jayson LABOR, DO  traMADol  (ULTRAM ) 50 MG tablet Take 1 tablet (50 mg total) by mouth every 6 (six) hours as needed. 06/02/24   Geroldine Berg, MD    Family History Family History  Problem Relation Age of Onset   Congestive Heart Failure Mother    Congestive Heart Failure Maternal Grandmother    Cancer Paternal Grandmother        ovarian    Social History Social History   Tobacco Use   Smoking status:  Never   Smokeless tobacco: Never  Vaping Use   Vaping status: Never Used  Substance Use Topics   Alcohol use: Yes    Comment: 1 beer daily    Drug use: No     Allergies   Patient has no known allergies.   Review of Systems Review of Systems Per HPI  Physical Exam Triage Vital Signs ED Triage Vitals  Encounter Vitals Group     BP 09/16/24 0844 135/68     Girls Systolic BP Percentile --      Girls Diastolic BP Percentile --      Boys Systolic BP Percentile --      Boys Diastolic BP Percentile --      Pulse Rate 09/16/24 0844 78     Resp 09/16/24 0844 18     Temp 09/16/24 0844 98.1 F (36.7 C)     Temp Source 09/16/24 0844 Oral     SpO2 09/16/24 0844 97 %     Weight --      Height --      Head Circumference --      Peak Flow --      Pain Score 09/16/24 0846 0     Pain Loc --      Pain Education --      Exclude from Growth Chart --    No data found.  Updated Vital Signs BP 135/68 (BP Location: Left Arm)   Pulse 78   Temp 98.1 F (36.7 C) (Oral)   Resp 18   LMP 02/09/2014   SpO2 97%   Visual Acuity Right Eye Distance:   Left Eye Distance:   Bilateral Distance:    Right Eye Near:   Left Eye Near:    Bilateral Near:     Physical Exam Vitals and nursing note reviewed.  Constitutional:      General: She is not in acute distress.    Appearance: Normal appearance.  HENT:     Head: Normocephalic.  Eyes:     Extraocular Movements: Extraocular movements intact.     Pupils: Pupils are equal, round, and reactive to light.  Cardiovascular:     Rate and  Rhythm: Normal rate and regular rhythm.     Pulses: Normal pulses.     Heart sounds: Normal heart sounds.  Pulmonary:     Effort: Pulmonary effort is normal. No respiratory distress.     Breath sounds: Normal breath sounds. No stridor. No wheezing, rhonchi or rales.  Abdominal:     General: Bowel sounds are normal.     Palpations: Abdomen is soft.  Musculoskeletal:     Cervical back: Normal range of motion.  Skin:    General: Skin is warm and dry.  Neurological:     General: No focal deficit present.     Mental Status: She is alert and oriented to person, place, and time.  Psychiatric:        Attention and Perception: Attention normal.        Mood and Affect: Mood is anxious and depressed.        Speech: Speech normal.        Behavior: Behavior normal. Behavior is cooperative.        Thought Content: Thought content normal. Thought content is not paranoid or delusional. Thought content does not include homicidal or suicidal ideation. Thought content does not include homicidal plan.        Cognition and Memory: Cognition normal.  Judgment: Judgment normal.      UC Treatments / Results  Labs (all labs ordered are listed, but only abnormal results are displayed) Labs Reviewed - No data to display  EKG: Normal sinus rhythm, no STEMI.  Compared to EKGs dated 07/31/2024, 07/03/2024, and 06/02/2024.   Radiology No results found.  Procedures Procedures (including critical care time)  Medications Ordered in UC Medications - No data to display  Initial Impression / Assessment and Plan / UC Course  I have reviewed the triage vital signs and the nursing notes.  Pertinent labs & imaging results that were available during my care of the patient were reviewed by me and considered in my medical decision making (see chart for details).  Patient presents for complaints of shortness of breath, panic, and increased anxiety.  Patient reports recent loss of her nephew who is 61 years  old.  She has been taking both Celexa  and hydroxyzine  with minimal relief.  Patient has been taking hydroxyzine  25 mg.  Will increase hydroxyzine  dose to 50 mg twice daily.  Supportive care recommendations were provided and discussed with the patient to include getting plenty of sleep, avoiding use of social media or television, and to reach out to family and friends.  Patient was advised that if symptoms fail to improve, recommend that she follow-up with her PCP, also recommend discussing referral to therapy with her primary care physician.  Patient was also given the information for the behavioral health Hospital.  Patient also advised she may follow-up in the emergency department.  Patient was in agreement with this plan of care and verbalizes understanding.  All questions were answered.  Patient stable for discharge.  Work note was provided.   Final Clinical Impressions(s) / UC Diagnoses   Final diagnoses:  None   Discharge Instructions   None    ED Prescriptions   None    PDMP not reviewed this encounter.   Gilmer Etta PARAS, NP 09/16/24 [phone]

## 2024-10-01 ENCOUNTER — Other Ambulatory Visit: Payer: Self-pay

## 2024-10-01 ENCOUNTER — Emergency Department (HOSPITAL_COMMUNITY)
Admission: EM | Admit: 2024-10-01 | Discharge: 2024-10-01 | Disposition: A | Discharge status: Home / Self Care | Attending: Emergency Medicine | Admitting: Emergency Medicine

## 2024-10-01 ENCOUNTER — Encounter (HOSPITAL_COMMUNITY): Payer: Self-pay | Admitting: *Deleted

## 2024-10-01 ENCOUNTER — Emergency Department (HOSPITAL_COMMUNITY)

## 2024-10-01 DIAGNOSIS — S39012A Strain of muscle, fascia and tendon of lower back, initial encounter: Secondary | ICD-10-CM | POA: Insufficient documentation

## 2024-10-01 DIAGNOSIS — Z7982 Long term (current) use of aspirin: Secondary | ICD-10-CM | POA: Insufficient documentation

## 2024-10-01 DIAGNOSIS — X501XXA Overexertion from prolonged static or awkward postures, initial encounter: Secondary | ICD-10-CM | POA: Diagnosis not present

## 2024-10-01 DIAGNOSIS — S3992XA Unspecified injury of lower back, initial encounter: Secondary | ICD-10-CM | POA: Diagnosis present

## 2024-10-01 MED ORDER — PREDNISONE 10 MG PO TABS: 40.0000 mg | ORAL_TABLET | Freq: Every day | ORAL | 0 refills | Status: AC

## 2024-10-01 MED ORDER — PREDNISONE 50 MG PO TABS
60.0000 mg | ORAL_TABLET | Freq: Once | ORAL | Status: AC
  Administered 2024-10-01: 60 mg via ORAL
  Filled 2024-10-01: qty 1

## 2024-10-01 MED ORDER — METHOCARBAMOL 750 MG PO TABS: 750.0000 mg | ORAL_TABLET | Freq: Three times a day (TID) | ORAL | 0 refills | Status: DC

## 2024-10-01 MED ORDER — METHOCARBAMOL 500 MG PO TABS
750.0000 mg | ORAL_TABLET | Freq: Once | ORAL | Status: AC
  Administered 2024-10-01: 750 mg via ORAL
  Filled 2024-10-01: qty 2

## 2024-10-01 MED ORDER — NAPROXEN 250 MG PO TABS
500.0000 mg | ORAL_TABLET | Freq: Once | ORAL | Status: AC
  Administered 2024-10-01: 500 mg via ORAL
  Filled 2024-10-01: qty 2

## 2024-10-01 NOTE — ED Provider Notes (Signed)
 [facility] Provider Note   CSN: [id number] Arrival date & time: 10/01/24  9056     Patient presents with: Vicki [name] is a 58 y.o. female.   Patient is a 58 year old female who presents emergency department with a chief complaint of right lower back pain with radiation down her right leg.  Patient notes that she took Xanax  last night for the first time and notes that upon awakening this morning she felt a little wobbly.  She notes that she tripped over an object in her house and caught herself before she fell to the floor straining her back.  She denies any urine or bowel incontinence, saddle paresthesias, gait changes, fever, chills, history of IV drug use, history of HIV, history of cancer, history of chronic steroid use.  She denies any abdominal pain.  She has had no nausea or vomiting.   Fall       Prior to Admission medications   Medication Sig Start Date End Date Taking? Authorizing Provider  acetaminophen  (TYLENOL ) 325 MG tablet Take 2 tablets (650 mg total) by mouth every 6 (six) hours as needed. 05/14/24   Elnor Jayson LABOR, DO  albuterol  (VENTOLIN  HFA) 108 (90 Base) MCG/ACT inhaler Inhale 2 puffs into the lungs every 4 (four) hours as needed for wheezing or shortness of breath. 07/10/23   Haze Lonni PARAS, MD  amLODipine  (NORVASC ) 5 MG tablet Take 1 tablet (5 mg total) by mouth daily. 03/10/21   Cleotilde Rogue, MD  aspirin  81 MG chewable tablet Chew 1 tablet (81 mg total) by mouth 2 (two) times daily. 10/06/21   Vernetta Lonni GRADE, MD  carvedilol  (COREG ) 3.125 MG tablet Take 1 tablet (3.125 mg total) by mouth 2 (two) times daily with a meal. 08/25/20   Idol, Mliss, PA-C  celecoxib  (CELEBREX ) 200 MG capsule Take 1 capsule (200 mg total) by mouth 2 (two) times daily. 03/31/24   Bultema, Abigail, PA-C  cholecalciferol  (VITAMIN D3) 25 MCG (1000 UNIT) tablet Take 1,000 Units by mouth daily.    [provider]   citalopram  (CELEXA ) 40 MG tablet Take 40 mg by mouth daily. 09/18/21   [provider]  furosemide  (LASIX ) 20 MG tablet Take 1 tablet (20 mg total) by mouth daily. 02/21/24 03/22/24  Stuart Vernell Norris, PA-C  gabapentin  (NEURONTIN ) 300 MG capsule Take 1 capsule (300 mg total) by mouth at bedtime. 01/31/24   Margrette Taft BRAVO, MD  HYDROcodone -acetaminophen  (NORCO/VICODIN) 5-325 MG tablet Take 1 tablet by mouth every 6 (six) hours as needed for severe pain (pain score 7-10). 06/24/24   Idol, Julie, PA-C  hydrOXYzine  (ATARAX ) 50 MG tablet Take 1 tablet (50 mg total) by mouth every 12 (twelve) hours as needed for anxiety. 09/16/24   Leath-Warren, Etta PARAS, NP  ibuprofen  (ADVIL ) 600 MG tablet Take 1 tablet (600 mg total) by mouth every 6 (six) hours as needed. 07/03/24   Horton, Charmaine FALCON, MD  lidocaine  (LIDODERM ) 5 % Place 1 patch onto the skin daily as needed. Remove & Discard patch within 12 hours or as directed by MD 05/14/24   Elnor Jayson A, DO  methocarbamol  (ROBAXIN ) 500 MG tablet Take 1 tablet (500 mg total) by mouth 2 (two) times daily. 09/04/24   Rolinda Rogue, MD  naproxen  (NAPROSYN ) 500 MG tablet Take 1 tablet (500 mg total) by mouth 2 (two) times daily. 06/02/24   Geroldine Berg, MD  olopatadine  (PATADAY ) 0.1 % ophthalmic solution Place 1  drop into both eyes 2 (two) times daily. 07/23/24   Stuart Vernell Norris, PA-C  oxybutynin  (DITROPAN  XL) 5 MG 24 hr tablet Take 1 tablet (5 mg total) by mouth at bedtime. 09/04/24   Rolinda Rogue, MD  oxyCODONE  (ROXICODONE ) 5 MG immediate release tablet Take 1 tablet (5 mg total) by mouth every 4 (four) hours as needed for severe pain (pain score 7-10). 05/14/24   Elnor Jayson LABOR, DO  simvastatin (ZOCOR) 20 MG tablet Take 20 mg by mouth daily. 03/16/22   [provider]  tiZANidine  (ZANAFLEX ) 4 MG tablet Take 1 tablet (4 mg total) by mouth every 6 (six) hours as needed for muscle spasms. 05/14/24   Elnor Jayson LABOR, DO  traMADol  (ULTRAM ) 50 MG tablet  Take 1 tablet (50 mg total) by mouth every 6 (six) hours as needed. 06/02/24   Geroldine Berg, MD    Allergies: Patient has no known allergies.    Review of Systems  Musculoskeletal:  Positive for back pain.  All other systems reviewed and are negative.   Updated Vital Signs BP (!) 176/72   Pulse 62   Temp 98.6 F (37 C) (Oral)   Resp 16   Ht 5' 7 (1.702 m)   Wt 77.1 kg   LMP 02/09/2014   SpO2 100%   BMI 26.63 kg/m   Physical Exam Nursing note reviewed.  Constitutional:      General: She is not in acute distress.    Appearance: Normal appearance. She is not ill-appearing.  HENT:     Head: Normocephalic and atraumatic.     Nose: Nose normal.     Mouth/Throat:     Mouth: Mucous membranes are moist.  Eyes:     Extraocular Movements: Extraocular movements intact.     Conjunctiva/sclera: Conjunctivae normal.     Pupils: Pupils are equal, round, and reactive to light.  Cardiovascular:     Rate and Rhythm: Normal rate and regular rhythm.     Pulses: Normal pulses.     Heart sounds: Normal heart sounds. No murmur heard.    No gallop.  Pulmonary:     Effort: Pulmonary effort is normal. No respiratory distress.     Breath sounds: Normal breath sounds. No stridor. No wheezing, rhonchi or rales.  Abdominal:     General: Abdomen is flat. Bowel sounds are normal. There is no distension.     Palpations: Abdomen is soft.     Tenderness: There is no abdominal tenderness. There is no guarding.  Musculoskeletal:        General: Normal range of motion.     Cervical back: Normal range of motion and neck supple. No rigidity or tenderness.     Comments: Tenderness palpation noted over lower lumbar spine and paraspinous muscles, no step-off or deformity, nontender palpation of the bilateral lower extremities, DP and PT pulses 2+ distally, sensation intact distally, full range of motion noted throughout, pelvis stable to AP and lateral compression  Skin:    General: Skin is warm and dry.   Neurological:     General: No focal deficit present.     Mental Status: She is alert and oriented to person, place, and time. Mental status is at baseline.     Cranial Nerves: No cranial nerve deficit.     Sensory: No sensory deficit.     Motor: No weakness.     Coordination: Coordination normal.     Gait: Gait normal.     Comments: Extension of bilateral  great toes intact, extension and flexion at hips intact  Psychiatric:        Mood and Affect: Mood normal.        Behavior: Behavior normal.        Thought Content: Thought content normal.        Judgment: Judgment normal.     (all labs ordered are listed, but only abnormal results are displayed) Labs Reviewed - No data to display  EKG: None  Radiology: No results found.   Procedures   Medications Ordered in the ED  naproxen  (NAPROSYN ) tablet 500 mg (500 mg Oral Given 10/01/24 1104)  predniSONE  (DELTASONE ) tablet 60 mg (60 mg Oral Given 10/01/24 1104)  methocarbamol  (ROBAXIN ) tablet 750 mg (750 mg Oral Given 10/01/24 1104)                                    Medical Decision Making Amount and/or Complexity of Data Reviewed Radiology: ordered.  Risk Prescription drug management.   This patient presents to the ED for concern of low back pain differential diagnosis includes muscle strain, vertebral fracture, cauda equina syndrome, vertebral osteomyelitis, epidural abscess, pyelonephritis, kidney stone    Additional history obtained:  Additional history obtained from none External records from outside source obtained and reviewed including none    Imaging Studies ordered:  I ordered imaging studies including x-ray lumbar spine I independently visualized and interpreted imaging which showed no acute process noted I agree with the radiologist interpretation   Medicines ordered and prescription drug management:  I ordered medication including naproxen , Robaxin , prednisone  for low back pain Reevaluation of  the patient after these medicines showed that the patient improved I have reviewed the patients home medicines and have made adjustments as needed   Problem List / ED Course:  Patient is doing very well at this time and is stable for discharge home.  Discussed with patient symptoms are most consistent with muscle strain at this time.  Low suspicion for pyelonephritis or kidney stone.  Symptoms did occur after straining her back.  X-ray was unremarked for any signs of acute fracture.  Patient is TUNAFISH negative and has no concerning neurological deficits.  Do not suspect cauda equina syndrome, vertebral osteomyelitis, epidural abscess.  The need for close follow-up with primary care doctor on an outpatient basis was discussed as well as strict turn precautions for any new or worsening symptoms.  Patient voiced understanding and had no additional questions.   Social Determinants of Health:  None        Final diagnoses:  None    ED Discharge Orders     None          Vicki [name] 10/01/24 1220    Suzette Pac, MD 10/02/24 1143

## 2024-10-01 NOTE — ED Triage Notes (Signed)
 Pt states she fell this am and is having back pain and thinks she may have pulled a muscle and is having right leg pain  Pt states she took a dose of her xanax  last night at bedtime and it was her first ever dose

## 2024-10-01 NOTE — Discharge Instructions (Signed)
 Please follow-up closely with your primary care doctor on an outpatient basis.  Return to emergency department immediately for any new or worsening symptoms.  Please start taking the prednisone  tomorrow.

## 2024-10-02 NOTE — ED Notes (Signed)
 Pt requesting an edit to her work note and C. Rigney PA agreed. New note written for pt. 10/02/24 @1148hrs 

## 2024-10-05 ENCOUNTER — Other Ambulatory Visit: Payer: Self-pay | Admitting: Orthopedic Surgery

## 2024-10-05 DIAGNOSIS — M5417 Radiculopathy, lumbosacral region: Secondary | ICD-10-CM

## 2024-10-05 DIAGNOSIS — M545 Low back pain, unspecified: Secondary | ICD-10-CM

## 2024-10-10 ENCOUNTER — Ambulatory Visit
Admission: EM | Admit: 2024-10-10 | Discharge: 2024-10-10 | Disposition: A | Discharge status: Home / Self Care | Attending: Family Medicine | Admitting: Family Medicine

## 2024-10-10 ENCOUNTER — Encounter

## 2024-10-10 DIAGNOSIS — S39012A Strain of muscle, fascia and tendon of lower back, initial encounter: Secondary | ICD-10-CM | POA: Diagnosis not present

## 2024-10-10 MED ORDER — PREDNISONE 20 MG PO TABS: 40.0000 mg | ORAL_TABLET | Freq: Every day | ORAL | 0 refills | Status: DC

## 2024-10-10 MED ORDER — TIZANIDINE HCL 4 MG PO CAPS: 4.0000 mg | ORAL_CAPSULE | Freq: Three times a day (TID) | ORAL | 0 refills | Status: DC | PRN

## 2024-10-10 NOTE — ED Provider Notes (Signed)
 [facility]    CSN: [id number] Arrival date & time: 10/10/24  0841      History   Chief Complaint No chief complaint on file.   HPI Vicki [name] is a 58 y.o. female.   Patient presenting today with 1 day history of left-sided low back pain, stiffness, soreness.  Denies radiation of pain down legs, numbness, weakness, tingling, bowel or bladder incontinence, saddle anesthesias, fevers, urinary symptoms.  So far trying Advil  with mild temporary benefit.  Was seen in the emergency department last week for the same, given prednisone , Robaxin  which she states did help while she was taking them but symptoms returned after she stopped.  She states she does a lot of heavy lifting day-to-day and thinks this is what is making things worse.    Past Medical History:  Diagnosis Date   Abnormal Pap smear    Anemia    Anxiety 2020   BV (bacterial vaginosis) 05/16/2013   Depression 2020   Dysrhythmia    Fibroids    uterine   Hypertension    Mental disorder    depression and panic attacks   Tachycardia 2018   pt states for the past 4 years she has went in and out of tachycardia    Patient Active Problem List   Diagnosis Date Noted   Low back pain 02/01/2023   Mild major depression, single episode 02/23/2022   Avascular necrosis of hip, left (HCC) 10/06/2021   Status post left hip replacement 10/06/2021   Noncompliance with medication regimen 05/03/2021   S/P abdominal hysterectomy 02/25/2014   Depression 07/25/2013   Panic attacks 07/25/2013   Fibroids 05/16/2013   BV (bacterial vaginosis) 05/16/2013   Anemia 04/03/2012   Menorrhagia 04/03/2012   Hypokalemia 04/03/2012   Lower urinary tract infectious disease 04/03/2012   Emesis 04/03/2012    Past Surgical History:  Procedure Laterality Date   ABDOMINAL HYSTERECTOMY     BILATERAL SALPINGECTOMY Bilateral 02/25/2014   Procedure: BILATERAL SALPINGECTOMY;  Surgeon: Vonn VEAR Inch, MD;  Location: [facility];   Service: Gynecology;  Laterality: Bilateral;   DILATION AND CURETTAGE OF UTERUS     SUPRACERVICAL ABDOMINAL HYSTERECTOMY N/A 02/25/2014   Procedure: HYSTERECTOMY SUPRACERVICAL ABDOMINAL;  Surgeon: Vonn VEAR Inch, MD;  Location: [facility];  Service: Gynecology;  Laterality: N/A;   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   Procedure: LEFT TOTAL HIP ARTHROPLASTY ANTERIOR APPROACH;  Surgeon: Vernetta Lonni GRADE, MD;  Location: [facility];  Service: Orthopedics;  Laterality: Left;   TUBAL LIGATION      OB History     Gravida  7   Para  5   Term      Preterm      AB  2   Living  5      SAB  2   IAB      Ectopic      Multiple      Live Births               Home Medications    Prior to Admission medications   Medication Sig Start Date End Date Taking? Authorizing Provider  predniSONE  (DELTASONE ) 20 MG tablet Take 2 tablets (40 mg total) by mouth daily with breakfast. 10/10/24  Yes Stuart Vernell Norris, PA-C  tiZANidine  (ZANAFLEX ) 4 MG capsule Take 1 capsule (4 mg total) by mouth 3 (three) times daily as needed for muscle spasms. Do not drink alcohol or drive while taking this medication.  May cause drowsiness. 10/10/24  Yes Stuart Vernell Norris, PA-C  acetaminophen  (TYLENOL ) 325 MG tablet Take 2 tablets (650 mg total) by mouth every 6 (six) hours as needed. 05/14/24   Elnor Jayson LABOR, DO  albuterol  (VENTOLIN  HFA) 108 (90 Base) MCG/ACT inhaler Inhale 2 puffs into the lungs every 4 (four) hours as needed for wheezing or shortness of breath. 07/10/23   Haze Lonni PARAS, MD  amLODipine  (NORVASC ) 5 MG tablet Take 1 tablet (5 mg total) by mouth daily. 03/10/21   Cleotilde Rogue, MD  aspirin  81 MG chewable tablet Chew 1 tablet (81 mg total) by mouth 2 (two) times daily. 10/06/21   Vernetta Lonni GRADE, MD  carvedilol  (COREG ) 3.125 MG tablet Take 1 tablet (3.125 mg total) by mouth 2 (two) times daily with a meal. 08/25/20   Idol, Mliss, PA-C  celecoxib  (CELEBREX ) 200 MG capsule Take  1 capsule (200 mg total) by mouth 2 (two) times daily. 03/31/24   Aman, Abigail, PA-C  cholecalciferol  (VITAMIN D3) 25 MCG (1000 UNIT) tablet Take 1,000 Units by mouth daily.    [provider]  citalopram  (CELEXA ) 40 MG tablet Take 40 mg by mouth daily. 09/18/21   [provider]  furosemide  (LASIX ) 20 MG tablet Take 1 tablet (20 mg total) by mouth daily. 02/21/24 03/22/24  Stuart Vernell Norris, PA-C  gabapentin  (NEURONTIN ) 300 MG capsule Take 1 capsule by mouth at bedtime 10/06/24   Harrison, Stanley E, MD  HYDROcodone -acetaminophen  (NORCO/VICODIN) 5-325 MG tablet Take 1 tablet by mouth every 6 (six) hours as needed for severe pain (pain score 7-10). 06/24/24   Idol, Julie, PA-C  hydrOXYzine  (ATARAX ) 50 MG tablet Take 1 tablet (50 mg total) by mouth every 12 (twelve) hours as needed for anxiety. 09/16/24   Leath-Warren, Etta PARAS, NP  ibuprofen  (ADVIL ) 600 MG tablet Take 1 tablet (600 mg total) by mouth every 6 (six) hours as needed. 07/03/24   Horton, Charmaine FALCON, MD  lidocaine  (LIDODERM ) 5 % Place 1 patch onto the skin daily as needed. Remove & Discard patch within 12 hours or as directed by MD 05/14/24   Elnor Jayson A, DO  methocarbamol  (ROBAXIN ) 750 MG tablet Take 1 tablet (750 mg total) by mouth 3 (three) times daily. 10/01/24   Daralene Lonni BIRCH, PA-C  naproxen  (NAPROSYN ) 500 MG tablet Take 1 tablet (500 mg total) by mouth 2 (two) times daily. 06/02/24   Geroldine Berg, MD  olopatadine  (PATADAY ) 0.1 % ophthalmic solution Place 1 drop into both eyes 2 (two) times daily. 07/23/24   Stuart Vernell Norris, PA-C  oxybutynin  (DITROPAN  XL) 5 MG 24 hr tablet Take 1 tablet (5 mg total) by mouth at bedtime. 09/04/24   Rolinda Rogue, MD  oxyCODONE  (ROXICODONE ) 5 MG immediate release tablet Take 1 tablet (5 mg total) by mouth every 4 (four) hours as needed for severe pain (pain score 7-10). 05/14/24   Elnor Jayson LABOR, DO  simvastatin (ZOCOR) 20 MG tablet Take 20 mg by mouth daily. 03/16/22    [provider]  tiZANidine  (ZANAFLEX ) 4 MG tablet Take 1 tablet (4 mg total) by mouth every 6 (six) hours as needed for muscle spasms. 05/14/24   Elnor Jayson LABOR, DO  traMADol  (ULTRAM ) 50 MG tablet Take 1 tablet (50 mg total) by mouth every 6 (six) hours as needed. 06/02/24   Geroldine Berg, MD    Family History Family History  Problem Relation Age of Onset   Congestive Heart Failure Mother    Congestive Heart Failure Maternal Grandmother  Cancer Paternal Grandmother        ovarian    Social History Social History   Tobacco Use   Smoking status: Never   Smokeless tobacco: Never  Vaping Use   Vaping status: Never Used  Substance Use Topics   Alcohol use: Yes    Comment: 1 beer daily    Drug use: No     Allergies   Patient has no known allergies.   Review of Systems Review of Systems PER HPI  Physical Exam Triage Vital Signs ED Triage Vitals  Encounter Vitals Group     BP 10/10/24 0907 (!) 165/81     Girls Systolic BP Percentile --      Girls Diastolic BP Percentile --      Boys Systolic BP Percentile --      Boys Diastolic BP Percentile --      Pulse Rate 10/10/24 0907 80     Resp 10/10/24 0907 18     Temp 10/10/24 0907 98.4 F (36.9 C)     Temp Source 10/10/24 0907 Oral     SpO2 10/10/24 0907 94 %     Weight --      Height --      Head Circumference --      Peak Flow --      Pain Score 10/10/24 0909 8     Pain Loc --      Pain Education --      Exclude from Growth Chart --    No data found.  Updated Vital Signs BP (!) 165/81 (BP Location: Right Arm)   Pulse 80   Temp 98.4 F (36.9 C) (Oral)   Resp 18   LMP 02/09/2014   SpO2 94%   Visual Acuity Right Eye Distance:   Left Eye Distance:   Bilateral Distance:    Right Eye Near:   Left Eye Near:    Bilateral Near:     Physical Exam Vitals and nursing note reviewed.  Constitutional:      Appearance: Normal appearance. She is not ill-appearing.  HENT:     Head: Atraumatic.  Eyes:      Extraocular Movements: Extraocular movements intact.     Conjunctiva/sclera: Conjunctivae normal.  Cardiovascular:     Rate and Rhythm: Normal rate.  Pulmonary:     Effort: Pulmonary effort is normal.  Musculoskeletal:        General: Tenderness present. No swelling or deformity. Normal range of motion.     Cervical back: Normal range of motion and neck supple.     Comments: Left lower lumbar musculature tender to palpation.  No midline spinal tenderness to palpation diffusely, negative straight leg raise bilateral lower extremities  Skin:    General: Skin is warm and dry.     Findings: No bruising or erythema.  Neurological:     Mental Status: She is alert and oriented to person, place, and time.     Motor: No weakness.     Gait: Gait normal.     Comments: Bilateral lower extremities neurovascularly intact  Psychiatric:        Mood and Affect: Mood normal.        Thought Content: Thought content normal.        Judgment: Judgment normal.      UC Treatments / Results  Labs (all labs ordered are listed, but only abnormal results are displayed) Labs Reviewed - No data to display  EKG   Radiology No results found.  Procedures Procedures (including critical care time)  Medications Ordered in UC Medications - No data to display  Initial Impression / Assessment and Plan / UC Course  I have reviewed the triage vital signs and the nursing notes.  Pertinent labs & imaging results that were available during my care of the patient were reviewed by me and considered in my medical decision making (see chart for details).     Vital signs and exam reassuring with no red flag findings today.  Treat with Zanaflex , prednisone , massage, heat, stretches.  Return for worsening symptoms.  Final Clinical Impressions(s) / UC Diagnoses   Final diagnoses:  Strain of lumbar region, initial encounter   Discharge Instructions   None    ED Prescriptions     Medication Sig  Dispense Auth. Provider   tiZANidine  (ZANAFLEX ) 4 MG capsule Take 1 capsule (4 mg total) by mouth 3 (three) times daily as needed for muscle spasms. Do not drink alcohol or drive while taking this medication.  May cause drowsiness. 15 capsule Stuart Vernell Norris, PA-C   predniSONE  (DELTASONE ) 20 MG tablet Take 2 tablets (40 mg total) by mouth daily with breakfast. 10 tablet Stuart Vernell Norris, [place]      PDMP not reviewed this encounter.   Stuart Vernell [place], [place] 10/10/24 [phone]

## 2024-10-10 NOTE — ED Triage Notes (Signed)
 Pt reports she has low back pain since this morning.  Took advil 

## 2024-11-25 ENCOUNTER — Ambulatory Visit
Admission: EM | Admit: 2024-11-25 | Discharge: 2024-11-25 | Disposition: A | Discharge status: Home / Self Care | Attending: Nurse Practitioner | Admitting: Nurse Practitioner

## 2024-11-25 DIAGNOSIS — S3992XA Unspecified injury of lower back, initial encounter: Secondary | ICD-10-CM

## 2024-11-25 DIAGNOSIS — M545 Low back pain, unspecified: Secondary | ICD-10-CM | POA: Diagnosis not present

## 2024-11-25 MED ORDER — LIDOCAINE 5 % EX PTCH: 1.0000 | MEDICATED_PATCH | CUTANEOUS | 0 refills | Status: DC
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
## 2024-11-25 NOTE — Discharge Instructions (Addendum)
 Apply medication as prescribed. You may continue over-the-counter Tylenol  or ibuprofen  as needed for pain, fever, or general discomfort. Apply ice or heat as needed.  Apply ice for pain or swelling, heat for spasm or stiffness.  Apply for 20 minutes, remove for 1 hour, repeat as needed. Try to remain as active as possible. Symptoms should begin to improve over the next 1 to 2 weeks.  If symptoms fail to improve, or begin to worsen, you may follow-up in this clinic or with your primary care physician for further evaluation. Follow-up as needed.
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
## 2024-11-28 ENCOUNTER — Encounter (HOSPITAL_COMMUNITY): Payer: Self-pay | Admitting: Emergency Medicine

## 2024-11-28 ENCOUNTER — Emergency Department (HOSPITAL_COMMUNITY): Admission: EM | Admit: 2024-11-28 | Discharge: 2024-11-28 | Disposition: A | Discharge status: Home / Self Care

## 2024-11-28 DIAGNOSIS — I10 Essential (primary) hypertension: Secondary | ICD-10-CM | POA: Insufficient documentation

## 2024-11-28 DIAGNOSIS — Z79899 Other long term (current) drug therapy: Secondary | ICD-10-CM | POA: Insufficient documentation

## 2024-11-28 DIAGNOSIS — R110 Nausea: Secondary | ICD-10-CM

## 2024-11-28 DIAGNOSIS — E876 Hypokalemia: Secondary | ICD-10-CM | POA: Insufficient documentation

## 2024-11-28 DIAGNOSIS — Z7982 Long term (current) use of aspirin: Secondary | ICD-10-CM | POA: Insufficient documentation

## 2024-11-28 LAB — CBC WITH DIFFERENTIAL/PLATELET
Abs Immature Granulocytes: 0.01 K/uL (ref 0.00–0.07)
Basophils Absolute: 0 K/uL (ref 0.0–0.1)
Basophils Relative: 1 %
Eosinophils Absolute: 0 K/uL (ref 0.0–0.5)
Eosinophils Relative: 1 %
HCT: 43.6 % (ref 36.0–46.0)
Hemoglobin: 14.9 g/dL (ref 12.0–15.0)
Immature Granulocytes: 0 %
Lymphocytes Relative: 29 %
Lymphs Abs: 1.6 K/uL (ref 0.7–4.0)
MCH: 27.9 pg (ref 26.0–34.0)
MCHC: 34.2 g/dL (ref 30.0–36.0)
MCV: 81.5 fL (ref 80.0–100.0)
Monocytes Absolute: 0.4 K/uL (ref 0.1–1.0)
Monocytes Relative: 7 %
Neutro Abs: 3.5 K/uL (ref 1.7–7.7)
Neutrophils Relative %: 62 %
Platelets: 253 K/uL (ref 150–400)
RBC: 5.35 MIL/uL — ABNORMAL HIGH (ref 3.87–5.11)
RDW: 14.6 % (ref 11.5–15.5)
WBC: 5.5 K/uL (ref 4.0–10.5)
nRBC: 0 % (ref 0.0–0.2)

## 2024-11-28 LAB — BASIC METABOLIC PANEL WITH GFR
Anion gap: 14 (ref 5–15)
BUN: 12 mg/dL (ref 6–20)
CO2: 25 mmol/L (ref 22–32)
Calcium: 9.3 mg/dL (ref 8.9–10.3)
Chloride: 104 mmol/L (ref 98–111)
Creatinine, Ser: 0.81 mg/dL (ref 0.44–1.00)
GFR, Estimated: >60 mL/min (ref >60)
Glucose, Bld: 97 mg/dL (ref 70–99)
Potassium: 3.1 mmol/L — ABNORMAL LOW (ref 3.5–5.1)
Sodium: 143 mmol/L (ref 135–145)

## 2024-11-28 MED ORDER — ONDANSETRON 4 MG PO TBDP
4.0000 mg | ORAL_TABLET | Freq: Once | ORAL | Status: AC
  Administered 2024-11-28: 4 mg via ORAL
  Filled 2024-11-28: qty 1

## 2024-11-28 MED ORDER — POTASSIUM CHLORIDE CRYS ER 20 MEQ PO TBCR: 40.0000 meq | EXTENDED_RELEASE_TABLET | Freq: Once | ORAL | Status: DC

## 2024-11-28 NOTE — ED Provider Notes (Signed)
 [place] EMERGENCY DEPARTMENT AT [facility] Provider Note   CSN: [id number] Arrival date & time: 11/28/24  9252     Patient presents with: Hypertension   Vicki [name] is a 58 y.o. female.   58 year old female presents for evaluation of hypertension.  States is where she took her antihypertensive medications and got tired and felt lightheaded and nauseous.  States she did not eat this morning.  States her blood pressure was 150/100.  It is now 160 over 70s and she states she is feeling much better.  Denies any change in her medications.  Denies missing any doses.  Denies any headaches, chest pain, blurry vision or any other symptoms or concerns at this time.   Hypertension Pertinent negatives include no chest pain, no abdominal pain and no shortness of breath.       Prior to Admission medications   Medication Sig Start Date End Date Taking? Authorizing Provider  acetaminophen  (TYLENOL ) 325 MG tablet Take 2 tablets (650 mg total) by mouth every 6 (six) hours as needed. 05/14/24   Elnor Jayson LABOR, DO  albuterol  (VENTOLIN  HFA) 108 (90 Base) MCG/ACT inhaler Inhale 2 puffs into the lungs every 4 (four) hours as needed for wheezing or shortness of breath. 07/10/23   Haze Lonni PARAS, MD  amLODipine  (NORVASC ) 5 MG tablet Take 1 tablet (5 mg total) by mouth daily. 03/10/21   Cleotilde Rogue, MD  aspirin  81 MG chewable tablet Chew 1 tablet (81 mg total) by mouth 2 (two) times daily. 10/06/21   Vernetta Lonni GRADE, MD  carvedilol  (COREG ) 3.125 MG tablet Take 1 tablet (3.125 mg total) by mouth 2 (two) times daily with a meal. 08/25/20   Idol, Mliss, PA-C  celecoxib  (CELEBREX ) 200 MG capsule Take 1 capsule (200 mg total) by mouth 2 (two) times daily. 03/31/24   Iseminger, Abigail, PA-C  cholecalciferol  (VITAMIN D3) 25 MCG (1000 UNIT) tablet Take 1,000 Units by mouth daily.    [provider]  citalopram  (CELEXA ) 40 MG tablet Take 40 mg by mouth daily. 09/18/21   [provider]  furosemide  (LASIX ) 20 MG tablet Take 1 tablet (20 mg total) by mouth daily. 02/21/24 03/22/24  Stuart Vernell Norris, PA-C  gabapentin  (NEURONTIN ) 300 MG capsule Take 1 capsule by mouth at bedtime 10/06/24   Harrison, Stanley E, MD  HYDROcodone -acetaminophen  (NORCO/VICODIN) 5-325 MG tablet Take 1 tablet by mouth every 6 (six) hours as needed for severe pain (pain score 7-10). 06/24/24   Idol, Julie, PA-C  hydrOXYzine  (ATARAX ) 50 MG tablet Take 1 tablet (50 mg total) by mouth every 12 (twelve) hours as needed for anxiety. 09/16/24   Leath-Warren, Etta PARAS, NP  ibuprofen  (ADVIL ) 600 MG tablet Take 1 tablet (600 mg total) by mouth every 6 (six) hours as needed. 07/03/24   Horton, Charmaine FALCON, MD  lidocaine  (LIDODERM ) 5 % Place 1 patch onto the skin daily. 11/25/24   Leath-Warren, Etta PARAS, NP  methocarbamol  (ROBAXIN ) 750 MG tablet Take 1 tablet (750 mg total) by mouth 3 (three) times daily. 10/01/24   Daralene Lonni BIRCH, PA-C  naproxen  (NAPROSYN ) 500 MG tablet Take 1 tablet (500 mg total) by mouth 2 (two) times daily. 06/02/24   Geroldine Berg, MD  olopatadine  (PATADAY ) 0.1 % ophthalmic solution Place 1 drop into both eyes 2 (two) times daily. 07/23/24   Stuart Vernell Norris, PA-C  oxybutynin  (DITROPAN  XL) 5 MG 24 hr tablet Take 1 tablet (5 mg total) by mouth at bedtime. 09/04/24  Rolinda Rogue, MD  oxyCODONE  (ROXICODONE ) 5 MG immediate release tablet Take 1 tablet (5 mg total) by mouth every 4 (four) hours as needed for severe pain (pain score 7-10). 05/14/24   Elnor Jayson LABOR, DO  predniSONE  (DELTASONE ) 20 MG tablet Take 2 tablets (40 mg total) by mouth daily with breakfast. 10/10/24   Stuart Vernell Norris, PA-C  simvastatin (ZOCOR) 20 MG tablet Take 20 mg by mouth daily. 03/16/22   [provider]  tiZANidine  (ZANAFLEX ) 4 MG capsule Take 1 capsule (4 mg total) by mouth 3 (three) times daily as needed for muscle spasms. Do not drink alcohol or drive while taking this medication.   May cause drowsiness. 10/10/24   Stuart Vernell Norris, PA-C  tiZANidine  (ZANAFLEX ) 4 MG tablet Take 1 tablet (4 mg total) by mouth every 6 (six) hours as needed for muscle spasms. 05/14/24   Elnor Jayson LABOR, DO  traMADol  (ULTRAM ) 50 MG tablet Take 1 tablet (50 mg total) by mouth every 6 (six) hours as needed. 06/02/24   Geroldine Berg, MD    Allergies: Patient has no known allergies.    Review of Systems  Constitutional:  Negative for chills and fever.  HENT:  Negative for ear pain and sore throat.   Eyes:  Negative for pain and visual disturbance.  Respiratory:  Negative for cough and shortness of breath.   Cardiovascular:  Negative for chest pain and palpitations.  Gastrointestinal:  Positive for nausea. Negative for abdominal pain and vomiting.  Genitourinary:  Negative for dysuria and hematuria.  Musculoskeletal:  Negative for arthralgias and back pain.  Skin:  Negative for color change and rash.  Neurological:  Positive for light-headedness. Negative for seizures and syncope.  All other systems reviewed and are negative.   Updated Vital Signs BP (!) 142/73   Pulse 66   Resp 11   LMP 02/09/2014   SpO2 97%   Physical Exam Vitals and nursing note reviewed.  Constitutional:      General: She is not in acute distress.    Appearance: Normal appearance. She is well-developed. She is not ill-appearing.  HENT:     Head: Normocephalic and atraumatic.  Eyes:     Conjunctiva/sclera: Conjunctivae normal.  Cardiovascular:     Rate and Rhythm: Normal rate and regular rhythm.     Heart sounds: No murmur heard. Pulmonary:     Effort: Pulmonary effort is normal. No respiratory distress.     Breath sounds: Normal breath sounds.  Abdominal:     Palpations: Abdomen is soft.     Tenderness: There is no abdominal tenderness.  Musculoskeletal:        General: No swelling.     Cervical back: Neck supple.  Skin:    General: Skin is warm and dry.     Capillary Refill: Capillary refill takes  less than 2 seconds.  Neurological:     General: No focal deficit present.     Mental Status: She is alert.  Psychiatric:        Mood and Affect: Mood normal.     (all labs ordered are listed, but only abnormal results are displayed) Labs Reviewed  BASIC METABOLIC PANEL WITH GFR - Abnormal; Notable for the following components:      Result Value   Potassium 3.1 (*)    All other components within normal limits  CBC WITH DIFFERENTIAL/PLATELET - Abnormal; Notable for the following components:   RBC 5.35 (*)    All other components within normal limits  EKG: EKG Interpretation Date/Time:  Friday November 28 2024 08:04:31 EST Ventricular Rate:  64 PR Interval:  135 QRS Duration:  78 QT Interval:  460 QTC Calculation: 475 R Axis:   110  Text Interpretation: Sinus rhythm Right ventricular hypertrophy Compared with prior EKG from 09/16/2024 Confirmed by Gennaro Bouchard ([id number]) on 11/28/2024 8:15:32 AM  Radiology: No results found.   Procedures   Medications Ordered in the ED  potassium chloride  SA (KLOR-CON  M) CR tablet 40 mEq (has no administration in time range)  ondansetron  (ZOFRAN -ODT) disintegrating tablet 4 mg (4 mg Oral Given 11/28/24 0830)                                    Medical Decision Making Cardiac monitor interpretation: sinus rhythm, no ectopy   Patient here for episode of HTN. Feeling improved and BP has improved. She took her medications this morning. Given zofran  for nausea. Found to have hypokalemia. This was replaced. Advised close follow up with primary care and to stay on her medications as prescribed. She feels comfortable being discharged home.   Problems Addressed: Hypertension, unspecified type: chronic illness or injury Hypokalemia: acute illness or injury Nausea: acute illness or injury  Amount and/or Complexity of Data Reviewed External Data Reviewed: notes.    Details: Urgent care records reviewed and patient seen 12/2 for back pain   Labs: ordered. Decision-making details documented in ED Course.    Details: Ordered and reviewed by me and patient has hypokalemia  ECG/medicine tests: ordered and independent interpretation performed. Decision-making details documented in ED Course.    Details: Ordered and interpreted by me in the absence of cardiology shows sinus rhythm, no STEMI or significant change when compared to prior  Risk OTC drugs. Prescription drug management.    Final diagnoses:  Hypertension, unspecified type  Hypokalemia  Nausea    ED Discharge Orders     None          Gennaro Bouchard CROME, DO 11/28/24 9071

## 2024-11-28 NOTE — Discharge Instructions (Signed)
 Stay on your medications as prescribed.:  Follow-up with your primary care doctor in 1 to 2 weeks.

## 2024-11-28 NOTE — ED Triage Notes (Signed)
 Pt c/o elevated BP and lightheadedness upon arriving to work at 7 am this morning. Endroeses Hx of HTN and states she did take her BP medication today. BP in triage 160/76. Denies any pain at this time

## 2024-12-01 ENCOUNTER — Other Ambulatory Visit: Payer: Self-pay | Admitting: Orthopedic Surgery

## 2024-12-01 DIAGNOSIS — M5417 Radiculopathy, lumbosacral region: Secondary | ICD-10-CM

## 2024-12-01 DIAGNOSIS — M545 Low back pain, unspecified: Secondary | ICD-10-CM

## 2024-12-09 ENCOUNTER — Emergency Department (HOSPITAL_COMMUNITY)
Admission: EM | Admit: 2024-12-09 | Discharge: 2024-12-09 | Disposition: A | Discharge status: Home / Self Care | Source: Home / Self Care | Attending: Emergency Medicine | Admitting: Emergency Medicine

## 2024-12-09 ENCOUNTER — Encounter (HOSPITAL_COMMUNITY): Payer: Self-pay | Admitting: Emergency Medicine

## 2024-12-09 ENCOUNTER — Emergency Department (HOSPITAL_COMMUNITY)

## 2024-12-09 ENCOUNTER — Other Ambulatory Visit: Payer: Self-pay

## 2024-12-09 DIAGNOSIS — Z79899 Other long term (current) drug therapy: Secondary | ICD-10-CM | POA: Diagnosis not present

## 2024-12-09 DIAGNOSIS — E876 Hypokalemia: Secondary | ICD-10-CM | POA: Diagnosis not present

## 2024-12-09 DIAGNOSIS — Z7982 Long term (current) use of aspirin: Secondary | ICD-10-CM | POA: Insufficient documentation

## 2024-12-09 DIAGNOSIS — R202 Paresthesia of skin: Secondary | ICD-10-CM | POA: Diagnosis present

## 2024-12-09 DIAGNOSIS — I10 Essential (primary) hypertension: Secondary | ICD-10-CM | POA: Diagnosis not present

## 2024-12-09 LAB — COMPREHENSIVE METABOLIC PANEL WITH GFR
ALT: 16 U/L (ref 0–44)
AST: 25 U/L (ref 15–41)
Albumin: 4.8 g/dL (ref 3.5–5.0)
Alkaline Phosphatase: 108 U/L (ref 38–126)
Anion gap: 19 — ABNORMAL HIGH (ref 5–15)
BUN: 10 mg/dL (ref 6–20)
CO2: 21 mmol/L — ABNORMAL LOW (ref 22–32)
Calcium: 9.9 mg/dL (ref 8.9–10.3)
Chloride: 97 mmol/L — ABNORMAL LOW (ref 98–111)
Creatinine, Ser: 1 mg/dL (ref 0.44–1.00)
GFR, Estimated: >60 mL/min (ref >60)
Glucose, Bld: 139 mg/dL — ABNORMAL HIGH (ref 70–99)
Potassium: 2.7 mmol/L — CL (ref 3.5–5.1)
Sodium: 137 mmol/L (ref 135–145)
Total Bilirubin: 0.7 mg/dL (ref 0.0–1.2)
Total Protein: 7.9 g/dL (ref 6.5–8.1)

## 2024-12-09 LAB — CBC
HCT: 43.5 % (ref 36.0–46.0)
Hemoglobin: 15.2 g/dL — ABNORMAL HIGH (ref 12.0–15.0)
MCH: 27.9 pg (ref 26.0–34.0)
MCHC: 34.9 g/dL (ref 30.0–36.0)
MCV: 79.8 fL — ABNORMAL LOW (ref 80.0–100.0)
Platelets: 286 K/uL (ref 150–400)
RBC: 5.45 MIL/uL — ABNORMAL HIGH (ref 3.87–5.11)
RDW: 14.6 % (ref 11.5–15.5)
WBC: 7.9 K/uL (ref 4.0–10.5)
nRBC: 0 % (ref 0.0–0.2)

## 2024-12-09 LAB — ETHANOL: Alcohol, Ethyl (B): <15 mg/dL (ref <15)

## 2024-12-09 LAB — DIFFERENTIAL
Abs Immature Granulocytes: 0.02 K/uL (ref 0.00–0.07)
Basophils Absolute: 0 K/uL (ref 0.0–0.1)
Basophils Relative: 1 %
Eosinophils Absolute: 0.2 K/uL (ref 0.0–0.5)
Eosinophils Relative: 2 %
Immature Granulocytes: 0 %
Lymphocytes Relative: 32 %
Lymphs Abs: 2.6 K/uL (ref 0.7–4.0)
Monocytes Absolute: 0.5 K/uL (ref 0.1–1.0)
Monocytes Relative: 6 %
Neutro Abs: 4.7 K/uL (ref 1.7–7.7)
Neutrophils Relative %: 59 %

## 2024-12-09 LAB — CBG MONITORING, ED: Glucose-Capillary: 153 mg/dL — ABNORMAL HIGH (ref 70–99)

## 2024-12-09 LAB — PROTIME-INR
INR: 0.9 (ref 0.8–1.2)
Prothrombin Time: 12.8 s (ref 11.4–15.2)

## 2024-12-09 LAB — APTT: aPTT: 33 s (ref 24–36)

## 2024-12-09 MED ORDER — POTASSIUM CHLORIDE 10 MEQ/100ML IV SOLN
10.0000 meq | INTRAVENOUS | Status: AC
  Administered 2024-12-09 (×2): 10 meq via INTRAVENOUS
  Filled 2024-12-09 (×2): qty 100

## 2024-12-09 MED ORDER — POTASSIUM CHLORIDE CRYS ER 20 MEQ PO TBCR: 40.0000 meq | EXTENDED_RELEASE_TABLET | Freq: Every day | ORAL | 0 refills | Status: AC

## 2024-12-09 MED ORDER — LORAZEPAM 2 MG/ML IJ SOLN
INTRAMUSCULAR | Status: AC
  Filled 2024-12-09: qty 1

## 2024-12-09 MED ORDER — POTASSIUM CHLORIDE CRYS ER 20 MEQ PO TBCR
40.0000 meq | EXTENDED_RELEASE_TABLET | Freq: Once | ORAL | Status: AC
  Administered 2024-12-09: 06:00:00 40 meq via ORAL
  Filled 2024-12-09: qty 2

## 2024-12-09 NOTE — ED Provider Notes (Signed)
 Care of patient assumed from Dr. Bari.  Patient presented to the ED last night for left-sided facial numbness.  Workup has been notable for hypokalemia.  She is receiving replacement potassium.  She has adamantly refused MRI. Physical Exam  BP 136/75   Pulse 73   Temp 98.4 F (36.9 C) (Oral)   Resp 14   Ht 5' 7 (1.702 m)   Wt 74.8 kg   LMP 02/09/2014   SpO2 95%   BMI 25.84 kg/m   Physical Exam Vitals and nursing note reviewed.  Constitutional:      General: She is not in acute distress.    Appearance: Normal appearance. She is well-developed. She is not ill-appearing, toxic-appearing or diaphoretic.  HENT:     Head: Normocephalic and atraumatic.     Right Ear: External ear normal.     Left Ear: External ear normal.     Nose: Nose normal.     Mouth/Throat:     Mouth: Mucous membranes are moist.  Eyes:     Extraocular Movements: Extraocular movements intact.     Conjunctiva/sclera: Conjunctivae normal.  Cardiovascular:     Rate and Rhythm: Normal rate and regular rhythm.  Pulmonary:     Effort: Pulmonary effort is normal. No respiratory distress.  Abdominal:     General: There is no distension.     Palpations: Abdomen is soft.  Musculoskeletal:        General: No swelling. Normal range of motion.     Cervical back: Normal range of motion and neck supple.  Skin:    General: Skin is warm and dry.     Capillary Refill: Capillary refill takes less than 2 seconds.  Neurological:     Mental Status: She is alert.     Cranial Nerves: No facial asymmetry.     Motor: Motor function is intact.     Coordination: Coordination is intact.  Psychiatric:        Mood and Affect: Mood normal.        Behavior: Behavior normal.     Procedures  Procedures  ED Course / MDM   Clinical Course as of 12/09/24 0725  Tue Dec 09, 2024  0503 Patient immediately evaluated at the bedside at nursing request.  Santina to bed at 9:30 PM normal.  States that she woke up with left facial numbness  and left hand numbness.  Noted her blood pressure to be elevated at home. [CH]  [phone] Patient refused MRI citing claustrophobia.  Discussed with her that I was happy to give her medications for claustrophobia.  However, patient declines.  Will only stay for potassium infusion.  States she feels much better.  Will need to sign out AGAINST MEDICAL ADVICE. [CH]    Clinical Course User Index [CH] Horton, Charmaine FALCON, MD   Medical Decision Making Amount and/or Complexity of Data Reviewed Labs: ordered. Radiology: ordered.  Risk Prescription drug management.   On assessment, patient resting comfortably.  IV potassium chloride  is infusing.  She continues to refuse MRI.  Patient was discharged in stable condition.       Melvenia Motto, MD 12/09/24 [phone]

## 2024-12-09 NOTE — ED Provider Notes (Signed)
  EMERGENCY DEPARTMENT AT [facility] Provider Note   CSN: [id number] Arrival date & time: 12/09/24  0445     Patient presents with: Numbness   Vicki [name] is a 58 y.o. female.  {Add pertinent medical, surgical, social history, OB history to HPI:32947} HPI     This is a 58 year old female with a history of hypertension who presents with concern for left facial and arm numbness.  Patient reports that she went to bed last night at 930 normal.  She woke up with left facial numbness and left hand and arm numbness.  Denies significant weakness.  States that her blood pressure was elevated at home.  No history of stroke.  Denies speech difficulty.  No vision changes.  Denies headache.  Prior to Admission medications  Medication Sig Start Date End Date Taking? Authorizing Provider  acetaminophen  (TYLENOL ) 325 MG tablet Take 2 tablets (650 mg total) by mouth every 6 (six) hours as needed. 05/14/24   Elnor Jayson LABOR, DO  albuterol  (VENTOLIN  HFA) 108 (90 Base) MCG/ACT inhaler Inhale 2 puffs into the lungs every 4 (four) hours as needed for wheezing or shortness of breath. 07/10/23   Haze Lonni PARAS, MD  amLODipine  (NORVASC ) 5 MG tablet Take 1 tablet (5 mg total) by mouth daily. 03/10/21   Cleotilde Rogue, MD  aspirin  81 MG chewable tablet Chew 1 tablet (81 mg total) by mouth 2 (two) times daily. 10/06/21   Vernetta Lonni GRADE, MD  carvedilol  (COREG ) 3.125 MG tablet Take 1 tablet (3.125 mg total) by mouth 2 (two) times daily with a meal. 08/25/20   Idol, Mliss, PA-C  celecoxib  (CELEBREX ) 200 MG capsule Take 1 capsule (200 mg total) by mouth 2 (two) times daily. 03/31/24   Viti, Abigail, PA-C  cholecalciferol  (VITAMIN D3) 25 MCG (1000 UNIT) tablet Take 1,000 Units by mouth daily.    [provider]  citalopram  (CELEXA ) 40 MG tablet Take 40 mg by mouth daily. 09/18/21   [provider]  furosemide  (LASIX ) 20 MG tablet Take 1 tablet (20 mg total) by mouth  daily. 02/21/24 03/22/24  Stuart Vernell Norris, PA-C  gabapentin  (NEURONTIN ) 300 MG capsule Take 1 capsule by mouth at bedtime 12/02/24   Harrison, Stanley E, MD  HYDROcodone -acetaminophen  (NORCO/VICODIN) 5-325 MG tablet Take 1 tablet by mouth every 6 (six) hours as needed for severe pain (pain score 7-10). 06/24/24   Idol, Julie, PA-C  hydrOXYzine  (ATARAX ) 50 MG tablet Take 1 tablet (50 mg total) by mouth every 12 (twelve) hours as needed for anxiety. 09/16/24   Leath-Warren, Etta PARAS, NP  ibuprofen  (ADVIL ) 600 MG tablet Take 1 tablet (600 mg total) by mouth every 6 (six) hours as needed. 07/03/24   Teylor Wolven, Charmaine FALCON, MD  lidocaine  (LIDODERM ) 5 % Place 1 patch onto the skin daily. 11/25/24   Leath-Warren, Etta PARAS, NP  methocarbamol  (ROBAXIN ) 750 MG tablet Take 1 tablet (750 mg total) by mouth 3 (three) times daily. 10/01/24   Daralene Lonni BIRCH, PA-C  naproxen  (NAPROSYN ) 500 MG tablet Take 1 tablet (500 mg total) by mouth 2 (two) times daily. 06/02/24   Geroldine Berg, MD  olopatadine  (PATADAY ) 0.1 % ophthalmic solution Place 1 drop into both eyes 2 (two) times daily. 07/23/24   Stuart Vernell Norris, PA-C  oxybutynin  (DITROPAN  XL) 5 MG 24 hr tablet Take 1 tablet (5 mg total) by mouth at bedtime. 09/04/24   Rolinda Rogue, MD  oxyCODONE  (ROXICODONE ) 5 MG immediate release tablet Take 1 tablet (  5 mg total) by mouth every 4 (four) hours as needed for severe pain (pain score 7-10). 05/14/24   Elnor Savant A, DO  predniSONE  (DELTASONE ) 20 MG tablet Take 2 tablets (40 mg total) by mouth daily with breakfast. 10/10/24   Stuart Vernell Norris, PA-C  simvastatin (ZOCOR) 20 MG tablet Take 20 mg by mouth daily. 03/16/22   [provider]  tiZANidine  (ZANAFLEX ) 4 MG capsule Take 1 capsule (4 mg total) by mouth 3 (three) times daily as needed for muscle spasms. Do not drink alcohol or drive while taking this medication.  May cause drowsiness. 10/10/24   Stuart Vernell Norris, PA-C  tiZANidine  (ZANAFLEX ) 4  MG tablet Take 1 tablet (4 mg total) by mouth every 6 (six) hours as needed for muscle spasms. 05/14/24   Elnor Savant LABOR, DO  traMADol  (ULTRAM ) 50 MG tablet Take 1 tablet (50 mg total) by mouth every 6 (six) hours as needed. 06/02/24   Geroldine Berg, MD    Allergies: Patient has no known allergies.    Review of Systems  Constitutional:  Negative for fever.  Neurological:  Positive for numbness. Negative for facial asymmetry, weakness and headaches.  All other systems reviewed and are negative.   Updated Vital Signs BP (!) 155/79   Pulse 83   Temp 98.4 F (36.9 C) (Oral)   Resp 14   Ht 1.702 m (5' 7)   Wt 74.8 kg   LMP 02/09/2014   SpO2 98%   BMI 25.84 kg/m   Physical Exam Vitals and nursing note reviewed.  Constitutional:      Appearance: She is well-developed. She is not ill-appearing.  HENT:     Head: Normocephalic and atraumatic.  Eyes:     Pupils: Pupils are equal, round, and reactive to light.  Cardiovascular:     Rate and Rhythm: Normal rate and regular rhythm.     Heart sounds: Normal heart sounds.  Pulmonary:     Effort: Pulmonary effort is normal. No respiratory distress.  Abdominal:     Palpations: Abdomen is soft.  Musculoskeletal:     Cervical back: Neck supple.  Skin:    General: Skin is warm and dry.  Neurological:     Mental Status: She is alert and oriented to person, place, and time.     Comments: Cranial nerves II through XII intact, fluent speech, 5 out of 5 strength in all 4 extremities, no drift, subjective decreased sensation left face  Psychiatric:        Mood and Affect: Mood normal.     (all labs ordered are listed, but only abnormal results are displayed) Labs Reviewed  CBG MONITORING, ED - Abnormal; Notable for the following components:      Result Value   Glucose-Capillary 153 (*)    All other components within normal limits  PROTIME-INR  APTT  CBC  DIFFERENTIAL  COMPREHENSIVE METABOLIC PANEL WITH GFR  ETHANOL  I-STAT CHEM 8,  ED    EKG: EKG Interpretation Date/Time:  Tuesday December 09 2024 05:00:15 EST Ventricular Rate:  82 PR Interval:  137 QRS Duration:  82 QT Interval:  442 QTC Calculation: 517 R Axis:   22  Text Interpretation: Sinus rhythm Borderline T abnormalities, anterior leads Prolonged QT interval Confirmed by Bari Pfeiffer ([id number]) on 12/09/2024 5:11:34 AM  Radiology: No results found.  {Document cardiac monitor, telemetry assessment procedure when appropriate:32947} Procedures   Medications Ordered in the ED - No data to display  Clinical Course as of 12/09/24  9488  Tue Dec 09, 2024  0503 Patient immediately evaluated at the bedside at nursing request.  Santina to bed at 9:30 PM normal.  States that she woke up with left facial numbness and left hand numbness.  Noted her blood pressure to be elevated at home. [CH]    Clinical Course User Index [CH] Alishea Beaudin, Charmaine FALCON, MD   {Click here for ABCD2, HEART and other calculators REFRESH Note before signing:1}                              Medical Decision Making Amount and/or Complexity of Data Reviewed Labs: ordered. Radiology: ordered.   ***  {Document critical care time when appropriate  Document review of labs and clinical decision tools ie CHADS2VASC2, etc  Document your independent review of radiology images and any outside records  Document your discussion with family members, caretakers and with consultants  Document social determinants of health affecting pt's care  Document your decision making why or why not admission, treatments were needed:32947:::1}   Final diagnoses:  None    ED Discharge Orders     None

## 2024-12-09 NOTE — ED Triage Notes (Signed)
 Pt in by POV w/ c/o of hypertension and numbness on her left side that she woke up with. EDP at bedside. LKW last night was 9:30pm

## 2024-12-09 NOTE — Discharge Instructions (Signed)
 You were seen today for numbness.  Your workup is reassuring.  You refused MRI.  Numbness can sometimes be called by low potassium which you had.  Take potassium as directed.  I could not fully rule out stroke.  Follow-up with neurology.

## 2025-01-07 ENCOUNTER — Encounter: Payer: Self-pay | Admitting: Emergency Medicine

## 2025-01-07 ENCOUNTER — Ambulatory Visit: Admission: EM | Admit: 2025-01-07 | Discharge: 2025-01-07 | Disposition: A | Discharge status: Home / Self Care

## 2025-01-07 DIAGNOSIS — S39012A Strain of muscle, fascia and tendon of lower back, initial encounter: Secondary | ICD-10-CM

## 2025-01-07 MED ORDER — NAPROXEN 500 MG PO TABS: 500.0000 mg | ORAL_TABLET | Freq: Two times a day (BID) | ORAL | 0 refills | Status: DC | PRN

## 2025-01-07 NOTE — Discharge Instructions (Signed)
 I have prescribed anti-inflammatory pain medication and you may continue the methocarbamol  as needed for muscle relaxation, heat, massage, stretches, rest.  Follow-up with orthopedics if worsening or not resolving

## 2025-01-07 NOTE — ED Triage Notes (Signed)
 Lower back pain after lifting a box today around 1pm  at work

## 2025-01-07 NOTE — ED Provider Notes (Signed)
 " [facility]    CSN: [id number] Arrival date & time: 01/07/25  1643      History   Chief Complaint No chief complaint on file.   HPI Vicki [name] is a 59 y.o. female.   Patient presenting today with new onset left lower back pain after lifting a heavy box earlier today.  Denies radiation of pain down legs, weakness, numbness, tingling, bowel or bladder incontinence, saddle anesthesias.  Tried some methocarbamol  that she has at home from prior back injuries with minimal relief.  Otherwise not trying anything over-the-counter for symptoms.    Past Medical History:  Diagnosis Date   Abnormal Pap smear    Anemia    Anxiety 2020   BV (bacterial vaginosis) 05/16/2013   Depression 2020   Dysrhythmia    Fibroids    uterine   Hypertension    Mental disorder    depression and panic attacks   Tachycardia 2018   pt states for the past 4 years she has went in and out of tachycardia    Patient Active Problem List   Diagnosis Date Noted   Low back pain 02/01/2023   Mild major depression, single episode 02/23/2022   Avascular necrosis of hip, left (HCC) 10/06/2021   Status post left hip replacement 10/06/2021   Noncompliance with medication regimen 05/03/2021   S/P abdominal hysterectomy 02/25/2014   Depression 07/25/2013   Panic attacks 07/25/2013   Fibroids 05/16/2013   BV (bacterial vaginosis) 05/16/2013   Anemia 04/03/2012   Menorrhagia 04/03/2012   Hypokalemia 04/03/2012   Lower urinary tract infectious disease 04/03/2012   Emesis 04/03/2012    Past Surgical History:  Procedure Laterality Date   ABDOMINAL HYSTERECTOMY     BILATERAL SALPINGECTOMY Bilateral 02/25/2014   Procedure: BILATERAL SALPINGECTOMY;  Surgeon: Vonn VEAR Inch, MD;  Location: [facility];  Service: Gynecology;  Laterality: Bilateral;   DILATION AND CURETTAGE OF UTERUS     SUPRACERVICAL ABDOMINAL HYSTERECTOMY N/A 02/25/2014   Procedure: HYSTERECTOMY SUPRACERVICAL ABDOMINAL;  Surgeon: Vonn VEAR Inch, MD;  Location: [facility];  Service: Gynecology;  Laterality: N/A;   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   Procedure: LEFT TOTAL HIP ARTHROPLASTY ANTERIOR APPROACH;  Surgeon: Vernetta Lonni GRADE, MD;  Location: [facility];  Service: Orthopedics;  Laterality: Left;   TUBAL LIGATION      OB History     Gravida  7   Para  5   Term      Preterm      AB  2   Living  5      SAB  2   IAB      Ectopic      Multiple      Live Births               Home Medications    Prior to Admission medications  Medication Sig Start Date End Date Taking? Authorizing Provider  hydrochlorothiazide (HYDRODIURIL) 12.5 MG tablet Take 12.5 mg by mouth daily.   Yes [provider]  acetaminophen  (TYLENOL ) 325 MG tablet Take 2 tablets (650 mg total) by mouth every 6 (six) hours as needed. 05/14/24   Elnor Jayson LABOR, DO  albuterol  (VENTOLIN  HFA) 108 (90 Base) MCG/ACT inhaler Inhale 2 puffs into the lungs every 4 (four) hours as needed for wheezing or shortness of breath. 07/10/23   Haze Lonni PARAS, MD  amLODipine  (NORVASC ) 5 MG tablet Take 1 tablet (5 mg total) by mouth daily. 03/10/21   Cleotilde,  Redell, MD  aspirin  81 MG chewable tablet Chew 1 tablet (81 mg total) by mouth 2 (two) times daily. 10/06/21   Vernetta Lonni GRADE, MD  celecoxib  (CELEBREX ) 200 MG capsule Take 1 capsule (200 mg total) by mouth 2 (two) times daily. 03/31/24   Fincher, Abigail, PA-C  cholecalciferol  (VITAMIN D3) 25 MCG (1000 UNIT) tablet Take 1,000 Units by mouth daily.    [provider]  citalopram  (CELEXA ) 40 MG tablet Take 40 mg by mouth daily. 09/18/21   [provider]  gabapentin  (NEURONTIN ) 300 MG capsule Take 1 capsule by mouth at bedtime 12/02/24   Harrison, Stanley E, MD  HYDROcodone -acetaminophen  (NORCO/VICODIN) 5-325 MG tablet Take 1 tablet by mouth every 6 (six) hours as needed for severe pain (pain score 7-10). 06/24/24   Idol, Julie, PA-C  hydrOXYzine  (ATARAX ) 50 MG tablet  Take 1 tablet (50 mg total) by mouth every 12 (twelve) hours as needed for anxiety. 09/16/24   Leath-Warren, Etta PARAS, NP  ibuprofen  (ADVIL ) 600 MG tablet Take 1 tablet (600 mg total) by mouth every 6 (six) hours as needed. 07/03/24   Horton, Charmaine FALCON, MD  methocarbamol  (ROBAXIN ) 750 MG tablet Take 1 tablet (750 mg total) by mouth 3 (three) times daily. 10/01/24   Daralene Lonni BIRCH, PA-C  naproxen  (NAPROSYN ) 500 MG tablet Take 1 tablet (500 mg total) by mouth 2 (two) times daily as needed. 01/07/25   Stuart Vernell Norris, PA-C  olopatadine  (PATADAY ) 0.1 % ophthalmic solution Place 1 drop into both eyes 2 (two) times daily. 07/23/24   Stuart Vernell Norris, PA-C  potassium chloride  SA (KLOR-CON  M) 20 MEQ tablet Take 2 tablets (40 mEq total) by mouth daily. 12/09/24   Horton, Charmaine FALCON, MD  simvastatin (ZOCOR) 20 MG tablet Take 20 mg by mouth daily. 03/16/22   [provider]  tiZANidine  (ZANAFLEX ) 4 MG capsule Take 1 capsule (4 mg total) by mouth 3 (three) times daily as needed for muscle spasms. Do not drink alcohol or drive while taking this medication.  May cause drowsiness. 10/10/24   Stuart Vernell Norris, PA-C  tiZANidine  (ZANAFLEX ) 4 MG tablet Take 1 tablet (4 mg total) by mouth every 6 (six) hours as needed for muscle spasms. 05/14/24   Elnor Jayson LABOR, DO  traMADol  (ULTRAM ) 50 MG tablet Take 1 tablet (50 mg total) by mouth every 6 (six) hours as needed. 06/02/24   Geroldine Berg, MD    Family History Family History  Problem Relation Age of Onset   Congestive Heart Failure Mother    Congestive Heart Failure Maternal Grandmother    Cancer Paternal Grandmother        ovarian    Social History Social History[1]   Allergies   Patient has no known allergies.   Review of Systems Review of Systems Per HPI  Physical Exam Triage Vital Signs ED Triage Vitals [01/07/25 1649]  Encounter Vitals Group     BP (!) 166/80     Girls Systolic BP Percentile      Girls Diastolic  BP Percentile      Boys Systolic BP Percentile      Boys Diastolic BP Percentile      Pulse Rate 89     Resp 18     Temp 99.1 F (37.3 C)     Temp Source Oral     SpO2 97 %     Weight      Height      Head Circumference  Peak Flow      Pain Score      Pain Loc      Pain Education      Exclude from Growth Chart    No data found.  Updated Vital Signs BP (!) 166/80 (BP Location: Right Arm)   Pulse 89   Temp 99.1 F (37.3 C) (Oral)   Resp 18   LMP 02/09/2014   SpO2 97%   Visual Acuity Right Eye Distance:   Left Eye Distance:   Bilateral Distance:    Right Eye Near:   Left Eye Near:    Bilateral Near:     Physical Exam Vitals and nursing note reviewed.  Constitutional:      Appearance: Normal appearance. She is not ill-appearing.  HENT:     Head: Atraumatic.     Mouth/Throat:     Mouth: Mucous membranes are moist.  Eyes:     Extraocular Movements: Extraocular movements intact.     Conjunctiva/sclera: Conjunctivae normal.  Cardiovascular:     Rate and Rhythm: Normal rate.  Pulmonary:     Effort: Pulmonary effort is normal.  Musculoskeletal:        General: Tenderness and signs of injury present. No swelling or deformity. Normal range of motion.     Cervical back: Normal range of motion and neck supple.     Comments: Tenderness to palpation to the left lumbar musculature.  No midline spinal tenderness to palpation diffusely.  Negative straight leg raise bilateral lower extremities.  Normal gait and range of motion  Skin:    General: Skin is warm and dry.  Neurological:     Mental Status: She is alert and oriented to person, place, and time.     Comments: Bilateral lower extremities neurovascularly intact  Psychiatric:        Mood and Affect: Mood normal.        Thought Content: Thought content normal.        Judgment: Judgment normal.      UC Treatments / Results  Labs (all labs ordered are listed, but only abnormal results are displayed) Labs  Reviewed - No data to display  EKG   Radiology No results found.  Procedures Procedures (including critical care time)  Medications Ordered in UC Medications - No data to display  Initial Impression / Assessment and Plan / UC Course  I have reviewed the triage vital signs and the nursing notes.  Pertinent labs & imaging results that were available during my care of the patient were reviewed by me and considered in my medical decision making (see chart for details).     Treat with naproxen , muscle relaxers which she states she has at home, heat, soft, stretches, rest.  Work note given for rest.  No red flag findings today.  Final Clinical Impressions(s) / UC Diagnoses   Final diagnoses:  Strain of lumbar region, initial encounter     Discharge Instructions      I have prescribed anti-inflammatory pain medication and you may continue the methocarbamol  as needed for muscle relaxation, heat, massage, stretches, rest.  Follow-up with orthopedics if worsening or not resolving    ED Prescriptions     Medication Sig Dispense Auth. Provider   naproxen  (NAPROSYN ) 500 MG tablet Take 1 tablet (500 mg total) by mouth 2 (two) times daily as needed. 20 tablet Stuart Vernell Norris, [place]      PDMP not reviewed this encounter.    [1]  Social History Tobacco Use  Smoking status: Never   Smokeless tobacco: Never  Vaping Use   Vaping status: Never Used  Substance Use Topics   Alcohol use: Yes    Comment: 1 beer daily    Drug use: No     Stuart Vernell Norris, PA-C 01/07/25 1815  "

## 2025-01-16 ENCOUNTER — Emergency Department (HOSPITAL_COMMUNITY)
Admission: EM | Admit: 2025-01-16 | Discharge: 2025-01-16 | Disposition: A | Discharge status: Home / Self Care | Attending: Emergency Medicine | Admitting: Emergency Medicine

## 2025-01-16 ENCOUNTER — Other Ambulatory Visit: Payer: Self-pay

## 2025-01-16 ENCOUNTER — Encounter (HOSPITAL_COMMUNITY): Payer: Self-pay

## 2025-01-16 DIAGNOSIS — M791 Myalgia, unspecified site: Secondary | ICD-10-CM | POA: Insufficient documentation

## 2025-01-16 DIAGNOSIS — R6889 Other general symptoms and signs: Secondary | ICD-10-CM

## 2025-01-16 DIAGNOSIS — R059 Cough, unspecified: Secondary | ICD-10-CM | POA: Insufficient documentation

## 2025-01-16 DIAGNOSIS — Z79899 Other long term (current) drug therapy: Secondary | ICD-10-CM | POA: Diagnosis not present

## 2025-01-16 DIAGNOSIS — R61 Generalized hyperhidrosis: Secondary | ICD-10-CM | POA: Insufficient documentation

## 2025-01-16 DIAGNOSIS — I10 Essential (primary) hypertension: Secondary | ICD-10-CM | POA: Insufficient documentation

## 2025-01-16 DIAGNOSIS — Z7982 Long term (current) use of aspirin: Secondary | ICD-10-CM | POA: Diagnosis not present

## 2025-01-16 DIAGNOSIS — R0981 Nasal congestion: Secondary | ICD-10-CM | POA: Diagnosis not present

## 2025-01-16 DIAGNOSIS — R079 Chest pain, unspecified: Secondary | ICD-10-CM | POA: Diagnosis not present

## 2025-01-16 NOTE — ED Triage Notes (Signed)
 Pov from home cc of flu like symptoms since last night. Took tylenol  at 3am.  Afraid to take anything else due to her bp issues Had to miss work this morning.

## 2025-01-16 NOTE — ED Provider Notes (Signed)
 " [place] EMERGENCY DEPARTMENT AT [facility] Provider Note   CSN: [id number] Arrival date & time: 01/16/25  0456     Patient presents with: Flu Like Symptoms   Vicki [name] is a 59 y.o. female.   The history is provided by the patient.   Patient with history of hypertension presents with flulike illness.  Patient reports last night she started having cough, congestion and myalgias.  She also reports diaphoresis.  No vomiting or diarrhea.  She is unsure if she had a fever.  She reports chest tightness with cough.     Prior to Admission medications  Medication Sig Start Date End Date Taking? Authorizing Provider  acetaminophen  (TYLENOL ) 325 MG tablet Take 2 tablets (650 mg total) by mouth every 6 (six) hours as needed. 05/14/24   Elnor Jayson LABOR, DO  albuterol  (VENTOLIN  HFA) 108 (90 Base) MCG/ACT inhaler Inhale 2 puffs into the lungs every 4 (four) hours as needed for wheezing or shortness of breath. 07/10/23   Haze Lonni PARAS, MD  amLODipine  (NORVASC ) 5 MG tablet Take 1 tablet (5 mg total) by mouth daily. 03/10/21   Cleotilde Rogue, MD  aspirin  81 MG chewable tablet Chew 1 tablet (81 mg total) by mouth 2 (two) times daily. 10/06/21   Vernetta Lonni GRADE, MD  celecoxib  (CELEBREX ) 200 MG capsule Take 1 capsule (200 mg total) by mouth 2 (two) times daily. 03/31/24   Godman, Abigail, PA-C  cholecalciferol  (VITAMIN D3) 25 MCG (1000 UNIT) tablet Take 1,000 Units by mouth daily.    [provider]  citalopram  (CELEXA ) 40 MG tablet Take 40 mg by mouth daily. 09/18/21   [provider]  gabapentin  (NEURONTIN ) 300 MG capsule Take 1 capsule by mouth at bedtime 12/02/24   Harrison, Stanley E, MD  hydrochlorothiazide (HYDRODIURIL) 12.5 MG tablet Take 12.5 mg by mouth daily.    [provider]  olopatadine  (PATADAY ) 0.1 % ophthalmic solution Place 1 drop into both eyes 2 (two) times daily. 07/23/24   Stuart Vernell Norris, PA-C  potassium chloride  SA  (KLOR-CON  M) 20 MEQ tablet Take 2 tablets (40 mEq total) by mouth daily. 12/09/24   Horton, Charmaine FALCON, MD  simvastatin (ZOCOR) 20 MG tablet Take 20 mg by mouth daily. 03/16/22   [provider]    Allergies: Patient has no known allergies.    Review of Systems  Constitutional:  Positive for diaphoresis.  Respiratory:  Positive for cough.   Gastrointestinal:  Negative for vomiting.  Musculoskeletal:  Positive for myalgias.    Updated Vital Signs BP (!) 152/75   Pulse 78   Resp 19   Ht 1.702 m (5' 7)   Wt 74.8 kg   LMP 02/09/2014   SpO2 97%   BMI 25.83 kg/m  Temp - 97 orally Physical Exam CONSTITUTIONAL: Well developed/well nourished HEAD: Normocephalic/atraumatic EYES: EOMI/PERRL ENMT: Mucous membranes moist, uvula midline, no stridor, no drooling, no exudates NECK: supple no meningeal signs CV: S1/S2 noted, no murmurs/rubs/gallops noted LUNGS: Lungs are clear to auscultation bilaterally, no apparent distress NEURO: Pt is awake/alert/appropriate, moves all extremitiesx4.  No facial droop.    (all labs ordered are listed, but only abnormal results are displayed) Labs Reviewed - No data to display  EKG: None  Radiology: No results found.   Procedures   Medications Ordered in the ED - No data to display  Medical Decision Making  Patient presents for flulike illness. She is in no acute distress, no tachypnea, no added lung sounds, no hypoxia Given her history, exam, and and flu season, this is likely influenza A However she is overall well-appearing and safe for outpatient management, will defer further workup Work note was provided.  We discussed strict return precautions     Final diagnoses:  Flu-like symptoms    ED Discharge Orders     None          Midge Golas, MD 01/16/25 [phone]  "
